# Patient Record
Sex: Male | Born: 1960 | Race: Black or African American | Hispanic: No | Marital: Married | Smoking: Current some day smoker
Health system: Southern US, Community
[De-identification: ages and names within clinical notes are randomized; demographics above are authoritative.]

## PROBLEM LIST (undated history)

## (undated) DIAGNOSIS — G4730 Sleep apnea, unspecified: Secondary | ICD-10-CM

## (undated) DIAGNOSIS — R7303 Prediabetes: Secondary | ICD-10-CM

## (undated) DIAGNOSIS — U071 COVID-19: Secondary | ICD-10-CM

## (undated) DIAGNOSIS — K219 Gastro-esophageal reflux disease without esophagitis: Secondary | ICD-10-CM

## (undated) DIAGNOSIS — I10 Essential (primary) hypertension: Secondary | ICD-10-CM

## (undated) DIAGNOSIS — M75102 Unspecified rotator cuff tear or rupture of left shoulder, not specified as traumatic: Secondary | ICD-10-CM

## (undated) DIAGNOSIS — K649 Unspecified hemorrhoids: Secondary | ICD-10-CM

## (undated) DIAGNOSIS — J449 Chronic obstructive pulmonary disease, unspecified: Secondary | ICD-10-CM

## (undated) DIAGNOSIS — R0600 Dyspnea, unspecified: Secondary | ICD-10-CM

## (undated) DIAGNOSIS — K921 Melena: Secondary | ICD-10-CM

## (undated) DIAGNOSIS — T7840XA Allergy, unspecified, initial encounter: Secondary | ICD-10-CM

## (undated) HISTORY — DX: Chronic obstructive pulmonary disease, unspecified: J44.9

## (undated) HISTORY — DX: Unspecified hemorrhoids: K64.9

## (undated) HISTORY — DX: Sleep apnea, unspecified: G47.30

## (undated) HISTORY — PX: OTHER SURGICAL HISTORY: SHX169

## (undated) HISTORY — PX: BOWEL RESECTION: SHX1257

## (undated) HISTORY — DX: Prediabetes: R73.03

## (undated) HISTORY — DX: Essential (primary) hypertension: I10

## (undated) HISTORY — PX: TONSILLECTOMY: SUR1361

## (undated) HISTORY — DX: Allergy, unspecified, initial encounter: T78.40XA

---

## 2003-03-22 DIAGNOSIS — I10 Essential (primary) hypertension: Secondary | ICD-10-CM | POA: Insufficient documentation

## 2012-01-03 DIAGNOSIS — Z0000 Encounter for general adult medical examination without abnormal findings: Secondary | ICD-10-CM | POA: Insufficient documentation

## 2012-02-18 ENCOUNTER — Encounter: Payer: Self-pay | Admitting: Internal Medicine

## 2012-03-25 ENCOUNTER — Ambulatory Visit (AMBULATORY_SURGERY_CENTER): Payer: BC Managed Care – PPO

## 2012-03-25 VITALS — Ht 70.0 in | Wt 265.0 lb

## 2012-03-25 DIAGNOSIS — Z1211 Encounter for screening for malignant neoplasm of colon: Secondary | ICD-10-CM

## 2012-03-25 MED ORDER — MOVIPREP 100 G PO SOLR: 1.0000 | Freq: Once | ORAL | Status: DC

## 2012-03-27 ENCOUNTER — Telehealth: Payer: Self-pay | Admitting: *Deleted

## 2012-03-27 NOTE — Telephone Encounter (Signed)
LM on pt. Answering machine to call if he still needs his prep called to [facility].  Our records show it was sent to the [facility] at [facility].  I received this call directly on my office phone.  I called [facility] pharmacy and they said the Rx was ready.

## 2012-04-06 ENCOUNTER — Ambulatory Visit (AMBULATORY_SURGERY_CENTER): Payer: BC Managed Care – PPO | Admitting: Internal Medicine

## 2012-04-06 ENCOUNTER — Encounter: Payer: Self-pay | Admitting: Internal Medicine

## 2012-04-06 VITALS — BP 147/96 | HR 74 | Temp 98.1°F | Resp 20 | Ht 70.0 in | Wt 265.0 lb

## 2012-04-06 DIAGNOSIS — Z1211 Encounter for screening for malignant neoplasm of colon: Secondary | ICD-10-CM

## 2012-04-06 DIAGNOSIS — D126 Benign neoplasm of colon, unspecified: Secondary | ICD-10-CM

## 2012-04-06 MED ORDER — SODIUM CHLORIDE 0.9 % IV SOLN: 500.0000 mL | INTRAVENOUS | Status: DC

## 2012-04-06 NOTE — Op Note (Signed)
[facility] [address] [place], [address]   COLONOSCOPY PROCEDURE REPORT  PATIENT: Levi Blankenship, Levi Blankenship  MR#: [id number] BIRTHDATE: 02/15/1961 , 51  yrs. old GENDER: Male ENDOSCOPIST: Beverley Fiedler, MD REFERRED BY: Abbott Pao, MD PROCEDURE DATE:  04/06/2012 PROCEDURE:   Colonoscopy with snare polypectomy ASA CLASS:   Class II INDICATIONS:average risk screening and first colonoscopy. MEDICATIONS: MAC sedation, administered by CRNA and propofol (Diprivan) 400mg  IV  DESCRIPTION OF PROCEDURE:   After the risks benefits and alternatives of the procedure were thoroughly explained, informed consent was obtained.  A digital rectal exam revealed no rectal mass.   The LB [id number]  endoscope was introduced through the anus and advanced to the cecum, which was identified by both the appendix and ileocecal valve. No adverse events experienced. The quality of the prep was good, using MoviPrep  The instrument was then slowly withdrawn as the colon was fully examined.   COLON FINDINGS: Six sessile polyps ranging between 3-31mm in size were found in the distal sigmoid colon and rectum.  Polypectomy was performed using cold snare.  All resections were complete and all polyp tissue was completely retrieved.   There was moderate diverticulosis noted in the ascending colon, descending colon, and sigmoid colon with associated muscular hypertrophy.  Retroflexed views revealed no abnormalities. The time to cecum=2 minutes 58 seconds.  Withdrawal time=17 minutes 44 seconds.  The scope was withdrawn and the procedure completed. COMPLICATIONS: There were no complications.  ENDOSCOPIC IMPRESSION: 1.   Six sessile polyps ranging between 3-72mm in size were found in the distal sigmoid colon and rectum; Polypectomy was performed using cold snare 2.   There was moderate diverticulosis noted in the ascending colon, descending colon, and sigmoid colon  RECOMMENDATIONS: 1.   Await pathology results 2.  High fiber diet 3.  Timing of repeat colonoscopy will be determined by pathology findings. 4.  You will receive a letter within 1-2 weeks with the results of your biopsy as well as final recommendations.  Please call my office if you have not received a letter after 3 weeks.   eSigned:  Beverley Fiedler, MD 04/06/2012 2:03 PM  cc: The Patient, Abbott Pao MD

## 2012-04-06 NOTE — Progress Notes (Signed)
Patient did not experience any of the following events: a burn prior to discharge; a fall within the facility; wrong site/side/patient/procedure/implant event; or a hospital transfer or hospital admission upon discharge from the facility. (G8907) Patient did not have preoperative order for IV antibiotic SSI prophylaxis. (G8918)  

## 2012-04-06 NOTE — Progress Notes (Signed)
Called to room to assist during endoscopic procedure.  Patient ID and intended procedure confirmed with present staff. Received instructions for my participation in the procedure from the performing physician.  

## 2012-04-06 NOTE — Patient Instructions (Addendum)
Handouts were given to your care partner on polyps, diverticulosis and high fiber diet.   You may resume your current medications today.  Please call if any questions or concerns.   YOU HAD AN ENDOSCOPIC PROCEDURE TODAY AT THE [facility]: Refer to the procedure report that was given to you for any specific questions about what was found during the examination.  If the procedure report does not answer your questions, please call your gastroenterologist to clarify.  If you requested that your care partner not be given the details of your procedure findings, then the procedure report has been included in a sealed envelope for you to review at your convenience later.  YOU SHOULD EXPECT: Some feelings of bloating in the abdomen. Passage of more gas than usual.  Walking can help get rid of the air that was put into your GI tract during the procedure and reduce the bloating. If you had a lower endoscopy (such as a colonoscopy or flexible sigmoidoscopy) you may notice spotting of blood in your stool or on the toilet paper. If you underwent a bowel prep for your procedure, then you may not have a normal bowel movement for a few days.  DIET: Your first meal following the procedure should be a light meal and then it is ok to progress to your normal diet.  A half-sandwich or bowl of soup is an example of a good first meal.  Heavy or fried foods are harder to digest and may make you feel nauseous or bloated.  Likewise meals heavy in dairy and vegetables can cause extra gas to form and this can also increase the bloating.  Drink plenty of fluids but you should avoid alcoholic beverages for 24 hours.  ACTIVITY: Your care partner should take you home directly after the procedure.  You should plan to take it easy, moving slowly for the rest of the day.  You can resume normal activity the day after the procedure however you should NOT DRIVE or use heavy machinery for 24 hours (because of the sedation medicines  used during the test).    SYMPTOMS TO REPORT IMMEDIATELY: A gastroenterologist can be reached at any hour.  During normal business hours, 8:30 AM to 5:00 PM Monday through Friday, call [phone].  After hours and on weekends, please call the GI answering service at [phone] who will take a message and have the physician on call contact you.   Following lower endoscopy (colonoscopy or flexible sigmoidoscopy):  Excessive amounts of blood in the stool  Significant tenderness or worsening of abdominal pains  Swelling of the abdomen that is new, acute  Fever of 100F or higher    FOLLOW UP: If any biopsies were taken you will be contacted by phone or by letter within the next 1-3 weeks.  Call your gastroenterologist if you have not heard about the biopsies in 3 weeks.  Our staff will call the home number listed on your records the next business day following your procedure to check on you and address any questions or concerns that you may have at that time regarding the information given to you following your procedure. This is a courtesy call and so if there is no answer at the home number and we have not heard from you through the emergency physician on call, we will assume that you have returned to your regular daily activities without incident.  SIGNATURES/CONFIDENTIALITY: You and/or your care partner have signed paperwork which will be entered into   your electronic medical record.  These signatures attest to the fact that that the information above on your After Visit Summary has been reviewed and is understood.  Full responsibility of the confidentiality of this discharge information lies with you and/or your care-partner.  

## 2012-04-06 NOTE — Progress Notes (Signed)
No complaints noted in the recovery room. Maw   

## 2012-04-07 ENCOUNTER — Telehealth: Payer: Self-pay | Admitting: *Deleted

## 2012-04-07 NOTE — Telephone Encounter (Signed)
  Follow up Call-  Call back number 04/06/2012  Post procedure Call Back phone  # [phone]  Permission to leave phone message Yes     Patient questions:  Do you have a fever, pain , or abdominal swelling? no Pain Score  0 *  Have you tolerated food without any problems? yes  Have you been able to return to your normal activities? yes  Do you have any questions about your discharge instructions: Diet   no Medications  no Follow up visit  no  Do you have questions or concerns about your Care? no  Actions: * If pain score is 4 or above: No action needed, pain <4.

## 2012-04-13 ENCOUNTER — Encounter: Payer: Self-pay | Admitting: Internal Medicine

## 2012-06-22 ENCOUNTER — Emergency Department (HOSPITAL_COMMUNITY)
Admission: EM | Admit: 2012-06-22 | Discharge: 2012-06-23 | Disposition: A | Discharge status: Home / Self Care | Payer: BC Managed Care – PPO | Attending: Emergency Medicine | Admitting: Emergency Medicine

## 2012-06-22 ENCOUNTER — Encounter (HOSPITAL_COMMUNITY): Payer: Self-pay

## 2012-06-22 DIAGNOSIS — Z79899 Other long term (current) drug therapy: Secondary | ICD-10-CM | POA: Insufficient documentation

## 2012-06-22 DIAGNOSIS — K089 Disorder of teeth and supporting structures, unspecified: Secondary | ICD-10-CM | POA: Insufficient documentation

## 2012-06-22 DIAGNOSIS — Z87891 Personal history of nicotine dependence: Secondary | ICD-10-CM | POA: Insufficient documentation

## 2012-06-22 DIAGNOSIS — Z7982 Long term (current) use of aspirin: Secondary | ICD-10-CM | POA: Insufficient documentation

## 2012-06-22 DIAGNOSIS — Z8669 Personal history of other diseases of the nervous system and sense organs: Secondary | ICD-10-CM | POA: Insufficient documentation

## 2012-06-22 DIAGNOSIS — K0889 Other specified disorders of teeth and supporting structures: Secondary | ICD-10-CM

## 2012-06-22 DIAGNOSIS — I10 Essential (primary) hypertension: Secondary | ICD-10-CM | POA: Insufficient documentation

## 2012-06-22 DIAGNOSIS — K047 Periapical abscess without sinus: Secondary | ICD-10-CM | POA: Insufficient documentation

## 2012-06-22 NOTE — ED Notes (Signed)
Patient presents with c/o right lower dental pain. Onset x 3 days. Patient had some PCN (unsure of dosage) at home and has taken 2 pills daily x 3 days. Reports that the swelling has decreased since taking the PCN. Patient has also taken BC powder, ibuprofen and oral gel with no relief in pain

## 2012-06-23 MED ORDER — HYDROCODONE-ACETAMINOPHEN 5-325 MG PO TABS: 1.0000 | ORAL_TABLET | ORAL | Status: DC | PRN

## 2012-06-23 MED ORDER — PENICILLIN V POTASSIUM 500 MG PO TABS: 500.0000 mg | ORAL_TABLET | Freq: Three times a day (TID) | ORAL | Status: DC

## 2012-06-23 NOTE — ED Notes (Signed)
Patient said he has been having mouth pain for about a week. The patient says it is intermittent pain that was getting better but tonight the pain got so severe he had to come in and be seen.  The patient put some BC powder on his gum to try and relieve the pain but it did not work.

## 2012-06-23 NOTE — ED Provider Notes (Signed)
History     CSN: [id number]  Arrival date & time 06/22/12  2327   First MD Initiated Contact with Patient 06/23/12 0038      Chief Complaint  Patient presents with  . Dental Pain   HPI  History provided by the patient. Patient is a 52 year old male with history of hypertension and sleep apnea who presents with complaints of right lower dental pain. Patient reports occasional off-and-on pains for past week or more. On Friday he began having increased pain she gradually worsened. Patient has been crushing BC powders and applying to the gums and tooth area initially with some relief but now without any relief of pain. Patient also reports using some leftover penicillin pills from old prescription. He uses for 2 days but has run out. Patient denies having any swelling of the gums or face. No bleeding or drainage from the mouth. No dental injury or trauma. Patient does wear partial dentures lower mouth but denies any change. No other aggravating or alleviating factors. Denies any associated symptoms. No fever, chills or sweats. Difficulty swallowing or breathing.      Past Medical History  Diagnosis Date  . Hypertension   . Sleep apnea     not currently using c-pap    Past Surgical History  Procedure Laterality Date  . Tumor neck      benign and removed  . Tonsillectomy      Family History  Problem Relation Age of Onset  . Colon cancer Maternal Grandmother     History  Substance Use Topics  . Smoking status: Former Games developer  . Smokeless tobacco: Never Used  . Alcohol Use: 0.5 oz/week    1 drink(s) per week      Review of Systems  Constitutional: Negative for fever and chills.  HENT: Positive for dental problem. Negative for sore throat and trouble swallowing.   Gastrointestinal: Negative for nausea and vomiting.  All other systems reviewed and are negative.    Allergies  Review of patient's allergies indicates no known allergies.  Home Medications   Current  Outpatient Rx  Name  Route  Sig  Dispense  Refill  . aspirin EC 81 MG tablet   Oral   Take 81 mg by mouth daily.         . cetirizine (ZYRTEC) 10 MG tablet   Oral   Take 10 mg by mouth daily.         Marland Kitchen lisinopril (PRINIVIL,ZESTRIL) 10 MG tablet               . PENICILLIN V POTASSIUM PO   Oral   Take 1 tablet by mouth.           BP 153/100  Pulse 77  Temp(Src) 98.1 F (36.7 C) (Oral)  Resp 18  SpO2 99%  Physical Exam  Nursing note and vitals reviewed. Constitutional: He is oriented to person, place, and time. He appears well-developed and well-nourished. No distress.  HENT:  Head: Normocephalic.  Mouth/Throat:    Patient with partial dentures to the lower teeth. Partial denture includes bilateral back molars and frontal incisor teeth. Patient exhibits tenderness to the lower gum at the base of the right second premolar area. There is no swelling or fluctuance or signs of drainable abscess. There is not significant pain to percussion over the tooth. No significant fractures. There is slight irritation to the gingiva at the base of the tooth. No friability.  Neck: Normal range of motion. Neck supple.  Cardiovascular:  Normal rate and regular rhythm.   Pulmonary/Chest: Effort normal and breath sounds normal.  Abdominal: Soft.  Musculoskeletal: Normal range of motion.  Lymphadenopathy:    He has no cervical adenopathy.  Neurological: He is alert and oriented to person, place, and time.  Skin: Skin is warm and dry.  Psychiatric: He has a normal mood and affect. His behavior is normal.    ED Course  Procedures  Dental Block Performed by: Angus Seller Authorized by: Angus Seller Consent: Verbal consent obtained. Risks and benefits: risks, benefits and alternatives were discussed Consent given by: patient Patient identity confirmed: provided demographic data  Location: Right lower second premolar  Local anesthetic: Bupivacaine 0.5% with  epinephrine  Anesthetic total: 1.8 ml  Irrigation method: syringe  Patient tolerance: Patient tolerated the procedure well with no immediate complications. Pain improved.      1. Pain, dental   2. Periapical abscess       MDM  1:15AM patient seen and evaluated. Patient sitting appears comfortable in no acute distress.  Patient reports complete relief of pain after dental block. Patient given prescription for penicillin to continue as well as pain medicine and dental referral.     Angus Seller, PA-C 06/23/12 2150

## 2012-06-23 NOTE — ED Notes (Signed)
Patient is alert and orientedx4.  Patient was explained discharge instructions and they understood them with no questions.   

## 2012-06-24 NOTE — ED Provider Notes (Signed)
Medical screening examination/treatment/procedure(s) were performed by non-physician practitioner and as supervising physician I was immediately available for consultation/collaboration.  Ricardo Schubach, MD 06/24/12 0624 

## 2014-07-20 DIAGNOSIS — M159 Polyosteoarthritis, unspecified: Secondary | ICD-10-CM | POA: Insufficient documentation

## 2014-07-20 DIAGNOSIS — M8949 Other hypertrophic osteoarthropathy, multiple sites: Secondary | ICD-10-CM | POA: Insufficient documentation

## 2015-01-05 ENCOUNTER — Encounter (HOSPITAL_COMMUNITY): Payer: Self-pay | Admitting: *Deleted

## 2015-01-05 ENCOUNTER — Emergency Department (INDEPENDENT_AMBULATORY_CARE_PROVIDER_SITE_OTHER): Payer: BC Managed Care – PPO

## 2015-01-05 ENCOUNTER — Emergency Department (INDEPENDENT_AMBULATORY_CARE_PROVIDER_SITE_OTHER)
Admission: EM | Admit: 2015-01-05 | Discharge: 2015-01-05 | Disposition: A | Discharge status: Home / Self Care | Payer: BC Managed Care – PPO | Source: Home / Self Care | Attending: Family Medicine | Admitting: Family Medicine

## 2015-01-05 DIAGNOSIS — M5000 Cervical disc disorder with myelopathy, unspecified cervical region: Secondary | ICD-10-CM

## 2015-01-05 MED ORDER — CYCLOBENZAPRINE HCL 5 MG PO TABS: 5.0000 mg | ORAL_TABLET | Freq: Three times a day (TID) | ORAL | Status: DC | PRN

## 2015-01-05 MED ORDER — METHYLPREDNISOLONE 4 MG PO TBPK: ORAL_TABLET | ORAL | Status: DC

## 2015-01-05 NOTE — ED Provider Notes (Addendum)
CSN: [id number]     Arrival date & time 01/05/15  1300 History   First MD Initiated Contact with Patient 01/05/15 1332     Chief Complaint  Patient presents with  . Neck Pain   (Consider location/radiation/quality/duration/timing/severity/associated sxs/prior Treatment) Patient is a 54 y.o. male presenting with neck pain. The history is provided by the patient.  Neck Pain Pain location:  R side Quality:  Shooting Radiates to: c/o pain into right thigh. Pain severity:  Moderate Onset quality:  Gradual Duration:  1 month Progression:  Worsening Chronicity:  New Context: not fall and not recent injury   Ineffective treatments:  None tried Associated symptoms: no bladder incontinence, no bowel incontinence, no numbness and no weakness   Risk factors: no hx of spinal trauma and no recent head injury     Past Medical History  Diagnosis Date  . Hypertension   . Sleep apnea     not currently using c-pap   Past Surgical History  Procedure Laterality Date  . Tumor neck      benign and removed  . Tonsillectomy     Family History  Problem Relation Age of Onset  . Colon cancer Maternal Grandmother    Social History  Substance Use Topics  . Smoking status: Former Research scientist (life sciences)  . Smokeless tobacco: Never Used  . Alcohol Use: 0.5 oz/week    1 drink(s) per week    Review of Systems  Gastrointestinal: Negative for bowel incontinence.  Genitourinary: Negative for bladder incontinence.  Musculoskeletal: Positive for neck pain. Negative for back pain, joint swelling and gait problem.  Skin: Negative.   Neurological: Negative for weakness and numbness.  All other systems reviewed and are negative.   Allergies  Review of patient's allergies indicates no known allergies.  Home Medications   Prior to Admission medications   Medication Sig Start Date End Date Taking? Authorizing Provider  aspirin EC 81 MG tablet Take 81 mg by mouth daily.    Historical Provider, MD  cetirizine  (ZYRTEC) 10 MG tablet Take 10 mg by mouth daily.    Historical Provider, MD  cyclobenzaprine (FLEXERIL) 5 MG tablet Take 1 tablet (5 mg total) by mouth 3 (three) times daily as needed for muscle spasms. 01/05/15   Billy Fischer, MD  HYDROcodone-acetaminophen (NORCO) 5-325 MG per tablet Take 1 tablet by mouth every 4 (four) hours as needed for pain. 06/23/12   Hazel Sams, PA-C  lisinopril (PRINIVIL,ZESTRIL) 10 MG tablet  03/03/12   Historical Provider, MD  methylPREDNISolone (MEDROL DOSEPAK) 4 MG TBPK tablet follow package directions, start on Friday. 01/05/15   Billy Fischer, MD  penicillin v potassium (VEETID) 500 MG tablet Take 1 tablet (500 mg total) by mouth 3 (three) times daily. 06/23/12   Hazel Sams, PA-C  PENICILLIN V POTASSIUM PO Take 1 tablet by mouth.    Historical Provider, MD   Meds Ordered and Administered this Visit  Medications - No data to display  BP 151/83 mmHg  Pulse 84  Temp(Src) 98.8 F (37.1 C) (Oral)  Resp 18  SpO2 98% No data found.   Physical Exam  Constitutional: He is oriented to person, place, and time. He appears well-developed and well-nourished.  Musculoskeletal: He exhibits tenderness.       Cervical back: He exhibits tenderness and pain. He exhibits normal range of motion, no bony tenderness, no swelling and normal pulse.       Back:  Neurological: He is alert and oriented to person, place, and  time.  Skin: Skin is warm and dry.  Nursing note and vitals reviewed.   ED Course  Procedures (including critical care time)  Labs Review Labs Reviewed - No data to display  Imaging Review Dg Cervical Spine Complete  01/05/2015  CLINICAL DATA:  Neck pain for a month, on the right. EXAM: CERVICAL SPINE  4+ VIEWS COMPARISON:  None. FINDINGS: Moderate degenerative disc narrowing and endplate spurring from J2-8 to C6-7. Left predominant mid cervical facet arthropathy with overgrowth narrowing the left C3-4 and C4-5 foramina, likely overestimated by  obliquity. No acute findings such is fracture, endplate erosion, or prevertebral edema. No subluxation. IMPRESSION: Lower cervical degenerative disc disease and milder facet arthropathy. Left C3-4 and C4-5 foraminal stenosis. Electronically Signed   By: Monte Fantasia M.D.   On: 01/05/2015 13:57     Visual Acuity Review  Right Eye Distance:   Left Eye Distance:   Bilateral Distance:    Right Eye Near:   Left Eye Near:    Bilateral Near:         MDM   1. Cervical disc disease with myelopathy        Billy Fischer, MD 01/06/15 [place], MD 01/06/15 1430

## 2015-01-05 NOTE — Discharge Instructions (Signed)
Take medicine as prescribed and see orthopedist next week for recheck.

## 2015-01-05 NOTE — ED Notes (Signed)
Pt  Reports     Neck  Pain   X  1  Month  Off  And  On     denys  Any  specefic  Injury           He    Is   Sitting  Upright on the  Exam table         Speaking in  Complete  sentances         Pt reports   Pain radiates  Down  r     Leg

## 2015-01-25 ENCOUNTER — Emergency Department (INDEPENDENT_AMBULATORY_CARE_PROVIDER_SITE_OTHER)
Admission: EM | Admit: 2015-01-25 | Discharge: 2015-01-25 | Disposition: A | Discharge status: Home / Self Care | Payer: BC Managed Care – PPO | Source: Home / Self Care | Attending: Family Medicine | Admitting: Family Medicine

## 2015-01-25 ENCOUNTER — Encounter (HOSPITAL_COMMUNITY): Payer: Self-pay | Admitting: Emergency Medicine

## 2015-01-25 DIAGNOSIS — A499 Bacterial infection, unspecified: Secondary | ICD-10-CM | POA: Diagnosis not present

## 2015-01-25 DIAGNOSIS — B9689 Other specified bacterial agents as the cause of diseases classified elsewhere: Secondary | ICD-10-CM

## 2015-01-25 DIAGNOSIS — J329 Chronic sinusitis, unspecified: Secondary | ICD-10-CM | POA: Diagnosis not present

## 2015-01-25 MED ORDER — AMOXICILLIN-POT CLAVULANATE 875-125 MG PO TABS: 1.0000 | ORAL_TABLET | Freq: Two times a day (BID) | ORAL | Status: DC

## 2015-01-25 NOTE — Discharge Instructions (Signed)
It was a pleasure to see you today.  You are being treated for bacterial sinusitis.   I am prescribing Augmentin 875-125 tablets, 1 tablet by mouth twice daily for 7 days.   Be sure to take plenty of fluids. I recommend MUCINEX 600mg  tablets, take 2 tablets by mouth every 12 hours, to thin mucus.  Follow up with your primary doctor in [place] or [facility] if worsening, if fevers, or other concerns.

## 2015-01-25 NOTE — ED Notes (Signed)
Pt has been suffering from chest and nasal congestion, cough and intermittent fever for about two weeks.  Pt states he has some generalized aches from coughing including a headache and some abdominal/chest discomfort.

## 2015-01-25 NOTE — ED Provider Notes (Signed)
CSN: [id number]     Arrival date & time 01/25/15  1301 History   First MD Initiated Contact with Patient 01/25/15 1324     Chief Complaint  Patient presents with  . URI   (Consider location/radiation/quality/duration/timing/severity/associated sxs/prior Treatment) Patient is a 54 y.o. male presenting with URI. The history is provided by the patient. No language interpreter was used.  URI Presenting symptoms: congestion, cough and rhinorrhea   Presenting symptoms: no ear pain, no facial pain, no fatigue, no fever and no sore throat   Severity:  Severe Patient presents with 2 weeks of nasal congestion that began with some fevers off and on.  Now without fever, but does have cough productive of non-bloody sputum. Has had purulent nasal discharge and tinged with blood, No facial pain, no shortness of breath. Has fetid taste in back of throat. No ear or eye symptoms.   Smokes 3 cigs/day. Works in school environment.    Primary doctor. Dr Nash Mantis, [facility].    Past Medical History  Diagnosis Date  . Hypertension   . Sleep apnea     not currently using c-pap   Past Surgical History  Procedure Laterality Date  . Tumor neck      benign and removed  . Tonsillectomy     Family History  Problem Relation Age of Onset  . Colon cancer Maternal Grandmother    Social History  Substance Use Topics  . Smoking status: Former Research scientist (life sciences)  . Smokeless tobacco: Never Used  . Alcohol Use: 0.5 oz/week    1 drink(s) per week    Review of Systems  Constitutional: Negative for fever and fatigue.  HENT: Positive for congestion and rhinorrhea. Negative for ear pain and sore throat.   Respiratory: Positive for cough.     Allergies  Review of patient's allergies indicates no known allergies.  Home Medications   Prior to Admission medications   Medication Sig Start Date End Date Taking? Authorizing Provider  aspirin EC 81 MG tablet Take 81 mg by mouth daily.   Yes Historical  Provider, MD  lisinopril (PRINIVIL,ZESTRIL) 10 MG tablet  03/03/12  Yes Historical Provider, MD  amoxicillin-clavulanate (AUGMENTIN) 875-125 MG tablet Take 1 tablet by mouth 2 (two) times daily. 01/25/15   Willeen Niece, MD  cetirizine (ZYRTEC) 10 MG tablet Take 10 mg by mouth daily.    Historical Provider, MD  cyclobenzaprine (FLEXERIL) 5 MG tablet Take 1 tablet (5 mg total) by mouth 3 (three) times daily as needed for muscle spasms. 01/05/15   Billy Fischer, MD  HYDROcodone-acetaminophen (NORCO) 5-325 MG per tablet Take 1 tablet by mouth every 4 (four) hours as needed for pain. 06/23/12   Hazel Sams, PA-C  methylPREDNISolone (MEDROL DOSEPAK) 4 MG TBPK tablet follow package directions, start on Friday. 01/05/15   Billy Fischer, MD  penicillin v potassium (VEETID) 500 MG tablet Take 1 tablet (500 mg total) by mouth 3 (three) times daily. 06/23/12   Hazel Sams, PA-C  PENICILLIN V POTASSIUM PO Take 1 tablet by mouth.    Historical Provider, MD   Meds Ordered and Administered this Visit  Medications - No data to display  BP 132/74 mmHg  Pulse 81  Temp(Src) 98.4 F (36.9 C) (Oral)  Resp 16  SpO2 97% No data found.   Physical Exam  Constitutional: He appears well-developed and well-nourished. No distress.  HENT:  Head: Normocephalic and atraumatic.  Right Ear: External ear normal.  Left Ear: External ear normal.  Eyes: Right eye exhibits no discharge. Left eye exhibits no discharge. No scleral icterus.  Neck: Normal range of motion. Neck supple.  Cardiovascular: Normal rate, regular rhythm and normal heart sounds.   Pulmonary/Chest: Effort normal and breath sounds normal. No respiratory distress. He has no wheezes. He has no rales. He exhibits no tenderness.  Lymphadenopathy:    He has no cervical adenopathy.  Skin: He is not diaphoretic.  No facial tenderness over frontal or maxillary sinuses.  Clear oropharynx. TMs clear bilaterally.   ED Course  Procedures (including critical care  time)  Labs Review Labs Reviewed - No data to display  Imaging Review No results found.   Visual Acuity Review  Right Eye Distance:   Left Eye Distance:   Bilateral Distance:    Right Eye Near:   Left Eye Near:    Bilateral Near:         MDM   1. Sinusitis, bacterial    Patient with 2 weeks sx consistent with bacterial sinusitis. Treatment with Augmentin (he admits at end of visit he had been taking PCN tablets from his wife, one tab a day, for a few days and felt a little better, but the PCN ran out).  To treat with Augmentin, follow up as needed.     Willeen Niece, MD 01/25/15 [phone]

## 2015-05-06 ENCOUNTER — Emergency Department (INDEPENDENT_AMBULATORY_CARE_PROVIDER_SITE_OTHER)
Admission: EM | Admit: 2015-05-06 | Discharge: 2015-05-06 | Disposition: A | Discharge status: Home / Self Care | Payer: BC Managed Care – PPO | Source: Home / Self Care | Attending: Emergency Medicine | Admitting: Emergency Medicine

## 2015-05-06 ENCOUNTER — Encounter (HOSPITAL_COMMUNITY): Payer: Self-pay | Admitting: Emergency Medicine

## 2015-05-06 ENCOUNTER — Emergency Department (INDEPENDENT_AMBULATORY_CARE_PROVIDER_SITE_OTHER): Payer: BC Managed Care – PPO

## 2015-05-06 DIAGNOSIS — J40 Bronchitis, not specified as acute or chronic: Secondary | ICD-10-CM

## 2015-05-06 MED ORDER — PREDNISONE 50 MG PO TABS: ORAL_TABLET | ORAL | Status: DC

## 2015-05-06 MED ORDER — HYDROCODONE-HOMATROPINE 5-1.5 MG/5ML PO SYRP: 5.0000 mL | ORAL_SOLUTION | Freq: Four times a day (QID) | ORAL | Status: DC | PRN

## 2015-05-06 MED ORDER — AZITHROMYCIN 250 MG PO TABS: ORAL_TABLET | ORAL | Status: DC

## 2015-05-06 NOTE — ED Provider Notes (Signed)
CSN: [id number]     Arrival date & time 05/06/15  1453 History   First MD Initiated Contact with Patient 05/06/15 1638     Chief Complaint  Patient presents with  . Cough  . Generalized Body Aches  . Nasal Congestion   (Consider location/radiation/quality/duration/timing/severity/associated sxs/prior Treatment) HPI He is a 55 year old man here for evaluation of fever and body aches. He states his symptoms started a week ago with fevers, headache, generalized body aches, nasal congestion, and cough. He states at one point he did seem like he was getting a little bit better, but then got worse again. He reports some intermittent nausea, but no vomiting. He states his appetite is good. He has taken some ibuprofen with temporary improvement. Today, he has noticed some popping in his ears. Denies any sore throat.  Past Medical History  Diagnosis Date  . Hypertension   . Sleep apnea     not currently using c-pap   Past Surgical History  Procedure Laterality Date  . Tumor neck      benign and removed  . Tonsillectomy     Family History  Problem Relation Age of Onset  . Colon cancer Maternal Grandmother    Social History  Substance Use Topics  . Smoking status: Former Research scientist (life sciences)  . Smokeless tobacco: Never Used  . Alcohol Use: 0.5 oz/week    1 drink(s) per week    Review of Systems As in history of present illness Allergies  Review of patient's allergies indicates no known allergies.  Home Medications   Prior to Admission medications   Medication Sig Start Date End Date Taking? Authorizing Provider  aspirin EC 81 MG tablet Take 81 mg by mouth daily.   Yes Historical Provider, MD  felodipine (PLENDIL) 10 MG 24 hr tablet Take 10 mg by mouth daily.   Yes Historical Provider, MD  lisinopril (PRINIVIL,ZESTRIL) 10 MG tablet  03/03/12  Yes Historical Provider, MD  amoxicillin-clavulanate (AUGMENTIN) 875-125 MG tablet Take 1 tablet by mouth 2 (two) times daily. 01/25/15   Willeen Niece,  MD  azithromycin (ZITHROMAX Z-PAK) 250 MG tablet Take 2 pills today, then 1 pill daily until gone. 05/06/15   Melony Overly, MD  cetirizine (ZYRTEC) 10 MG tablet Take 10 mg by mouth daily.    Historical Provider, MD  cyclobenzaprine (FLEXERIL) 5 MG tablet Take 1 tablet (5 mg total) by mouth 3 (three) times daily as needed for muscle spasms. 01/05/15   Billy Fischer, MD  HYDROcodone-acetaminophen (NORCO) 5-325 MG per tablet Take 1 tablet by mouth every 4 (four) hours as needed for pain. 06/23/12   Hazel Sams, PA-C  HYDROcodone-homatropine (HYCODAN) 5-1.5 MG/5ML syrup Take 5 mLs by mouth every 6 (six) hours as needed for cough. 05/06/15   Melony Overly, MD  methylPREDNISolone (MEDROL DOSEPAK) 4 MG TBPK tablet follow package directions, start on Friday. 01/05/15   Billy Fischer, MD  penicillin v potassium (VEETID) 500 MG tablet Take 1 tablet (500 mg total) by mouth 3 (three) times daily. 06/23/12   Hazel Sams, PA-C  PENICILLIN V POTASSIUM PO Take 1 tablet by mouth.    Historical Provider, MD  predniSONE (DELTASONE) 50 MG tablet Take 1 pill daily for 5 days. 05/06/15   Melony Overly, MD   Meds Ordered and Administered this Visit  Medications - No data to display  BP 157/93 mmHg  Pulse 118  Temp(Src) 100.8 F (38.2 C) (Oral)  SpO2 97% No data found.   Physical  Exam  Constitutional: He is oriented to person, place, and time. He appears well-developed and well-nourished.  HENT:  Mouth/Throat: No oropharyngeal exudate.  Slight oropharyngeal erythema. Nasal mucosa is erythematous. TMs normal bilaterally.  Neck: Neck supple.  Cardiovascular: Regular rhythm and normal heart sounds.   No murmur heard. Mild tachycardia  Pulmonary/Chest: Effort normal and breath sounds normal. No respiratory distress. He has no wheezes. He has no rales.  Lymphadenopathy:    He has no cervical adenopathy.  Neurological: He is alert and oriented to person, place, and time.    ED Course  Procedures (including critical  care time)  Labs Review Labs Reviewed - No data to display  Imaging Review Dg Chest 2 View  05/06/2015  CLINICAL DATA:  Cough and fever for 1 week. EXAM: CHEST  2 VIEW COMPARISON:  None. FINDINGS: The cardiomediastinal contours are normal. An 11 mm nodular opacity projects over the periphery of the right lower lung, and anterior lateral right seventh rib. Pulmonary vasculature is normal. No consolidation, pleural effusion, or pneumothorax. No acute osseous abnormalities are seen. There is degenerative change in the spine. IMPRESSION: 1.  No acute pulmonary process. 2. Questionable 11 mm nodular opacity projecting over the right lower lung zone, as well as anterior anterior lateral seventh rib. This is more lateral than expected for nipple shadow. Bone island versus pulmonary nodule. Follow-up chest radiographs (in 2-4 weeks) to confirm radiographic stability versus nonemergent chest CT characterization recommended. No prior exams are available for comparison. Electronically Signed   By: Jeb Levering M.D.   On: 05/06/2015 17:13     MDM   1. Bronchitis    Given persistent fever after one week, will treat for bronchitis with azithromycin and prednisone. Hycodan as needed for cough.  I discussed the x-ray findings with the patient and his wife. He does have a smoking history. He has a physical scheduled at [facility] next week. I provided information about the nodule on his discharge paperwork. I have requested that he discuss this with his primary care doctor to arrange follow-up.    Melony Overly, MD 05/06/15 1725

## 2015-05-06 NOTE — ED Notes (Signed)
The patient presented to the [facility] with a complaint of a cough with general body aches and chest congestion for 1 week.

## 2015-05-06 NOTE — Discharge Instructions (Signed)
You have bronchitis. Take azithromycin and prednisone as prescribed. Use hycodan as needed for cough. You should see improvement in the next 3-5 days. If you develop fevers, difficulty breathing, or are just not getting better, please come back or go to the emergency room.  Your x-ray did show a nodule in your right lower lung. Please let your primary care doctor know about this.  The radiologist would like either a repeat chest x-ray in 2-4 weeks or a low-dose CT scan to follow-up the nodule.  Follow-up as needed.

## 2015-05-11 DIAGNOSIS — Z87891 Personal history of nicotine dependence: Secondary | ICD-10-CM | POA: Insufficient documentation

## 2015-05-23 ENCOUNTER — Other Ambulatory Visit (HOSPITAL_COMMUNITY): Payer: Self-pay | Admitting: Family Medicine

## 2015-05-23 DIAGNOSIS — R9389 Abnormal findings on diagnostic imaging of other specified body structures: Secondary | ICD-10-CM

## 2015-05-23 DIAGNOSIS — Z87891 Personal history of nicotine dependence: Secondary | ICD-10-CM

## 2015-06-05 ENCOUNTER — Ambulatory Visit (HOSPITAL_COMMUNITY)
Admission: RE | Admit: 2015-06-05 | Discharge: 2015-06-05 | Disposition: A | Discharge status: Home / Self Care | Payer: BC Managed Care – PPO | Source: Ambulatory Visit | Attending: Family Medicine | Admitting: Family Medicine

## 2015-06-05 ENCOUNTER — Encounter (HOSPITAL_COMMUNITY): Payer: Self-pay

## 2015-06-05 DIAGNOSIS — R938 Abnormal findings on diagnostic imaging of other specified body structures: Secondary | ICD-10-CM | POA: Diagnosis not present

## 2015-06-05 DIAGNOSIS — Z87891 Personal history of nicotine dependence: Secondary | ICD-10-CM | POA: Diagnosis not present

## 2015-06-05 DIAGNOSIS — R9389 Abnormal findings on diagnostic imaging of other specified body structures: Secondary | ICD-10-CM

## 2015-06-05 MED ORDER — IOHEXOL 300 MG/ML  SOLN
75.0000 mL | Freq: Once | INTRAMUSCULAR | Status: AC | PRN
  Administered 2015-06-05: 75 mL via INTRAVENOUS

## 2016-08-18 ENCOUNTER — Encounter (HOSPITAL_COMMUNITY): Payer: Self-pay | Admitting: Emergency Medicine

## 2016-08-18 ENCOUNTER — Ambulatory Visit (HOSPITAL_COMMUNITY)
Admission: EM | Admit: 2016-08-18 | Discharge: 2016-08-18 | Disposition: A | Discharge status: Home / Self Care | Payer: BC Managed Care – PPO | Attending: Internal Medicine | Admitting: Internal Medicine

## 2016-08-18 DIAGNOSIS — R059 Cough, unspecified: Secondary | ICD-10-CM

## 2016-08-18 DIAGNOSIS — R05 Cough: Secondary | ICD-10-CM

## 2016-08-18 DIAGNOSIS — J40 Bronchitis, not specified as acute or chronic: Secondary | ICD-10-CM

## 2016-08-18 MED ORDER — AZITHROMYCIN 250 MG PO TABS: 250.0000 mg | ORAL_TABLET | Freq: Every day | ORAL | 0 refills | Status: DC

## 2016-08-18 MED ORDER — IPRATROPIUM BROMIDE 0.06 % NA SOLN: 2.0000 | Freq: Four times a day (QID) | NASAL | 0 refills | Status: DC

## 2016-08-18 MED ORDER — BENZONATATE 100 MG PO CAPS: 200.0000 mg | ORAL_CAPSULE | Freq: Three times a day (TID) | ORAL | 0 refills | Status: DC | PRN

## 2016-08-18 MED ORDER — METHYLPREDNISOLONE 4 MG PO TBPK: ORAL_TABLET | ORAL | 0 refills | Status: DC

## 2016-08-18 NOTE — ED Provider Notes (Signed)
CSN: [id number]     Arrival date & time 08/18/16  1209 History   None    Chief Complaint  Patient presents with  . URI   (Consider location/radiation/quality/duration/timing/severity/associated sxs/prior Treatment) Patient is coughing and tight in chest and having difficulty sleeping at night.   The history is provided by the patient.  URI  Presenting symptoms: congestion, cough, fatigue and rhinorrhea   Severity:  Moderate Onset quality:  Sudden Duration:  2 days Timing:  Constant Progression:  Worsening Chronicity:  New Relieved by:  Nothing Worsened by:  Nothing   Past Medical History:  Diagnosis Date  . Hypertension   . Sleep apnea    not currently using c-pap   Past Surgical History:  Procedure Laterality Date  . TONSILLECTOMY    . tumor neck     benign and removed   Family History  Problem Relation Age of Onset  . Colon cancer Maternal Grandmother    Social History  Substance Use Topics  . Smoking status: Former Research scientist (life sciences)  . Smokeless tobacco: Never Used  . Alcohol use 0.5 oz/week    1 drink(s) per week    Review of Systems  Constitutional: Positive for fatigue.  HENT: Positive for congestion and rhinorrhea.   Eyes: Negative.   Respiratory: Positive for cough.   Cardiovascular: Negative.   Gastrointestinal: Negative.   Endocrine: Negative.   Genitourinary: Negative.   Musculoskeletal: Negative.   Allergic/Immunologic: Negative.   Neurological: Negative.   Hematological: Negative.   Psychiatric/Behavioral: Negative.     Allergies  Patient has no known allergies.  Home Medications   Prior to Admission medications   Medication Sig Start Date End Date Taking? Authorizing Provider  amoxicillin-clavulanate (AUGMENTIN) 875-125 MG tablet Take 1 tablet by mouth 2 (two) times daily. 01/25/15   Willeen Niece, MD  aspirin EC 81 MG tablet Take 81 mg by mouth daily.    [provider]  azithromycin (ZITHROMAX Z-PAK) 250 MG tablet Take 2 pills  today, then 1 pill daily until gone. 05/06/15   Melony Overly, MD  azithromycin (ZITHROMAX) 250 MG tablet Take 1 tablet (250 mg total) by mouth daily. Take first 2 tablets together, then 1 every day until finished. 08/18/16   Lysbeth Penner, FNP  benzonatate (TESSALON) 100 MG capsule Take 2 capsules (200 mg total) by mouth 3 (three) times daily as needed for cough. 08/18/16   Lysbeth Penner, FNP  cetirizine (ZYRTEC) 10 MG tablet Take 10 mg by mouth daily.    [provider]  cyclobenzaprine (FLEXERIL) 5 MG tablet Take 1 tablet (5 mg total) by mouth 3 (three) times daily as needed for muscle spasms. 01/05/15   Billy Fischer, MD  felodipine (PLENDIL) 10 MG 24 hr tablet Take 10 mg by mouth daily.    [provider]  HYDROcodone-acetaminophen (NORCO) 5-325 MG per tablet Take 1 tablet by mouth every 4 (four) hours as needed for pain. 06/23/12   Dammen, Collier [place], PA-C  HYDROcodone-homatropine (HYCODAN) 5-1.5 MG/5ML syrup Take 5 mLs by mouth every 6 (six) hours as needed for cough. 05/06/15   Melony Overly, MD  ipratropium (ATROVENT) 0.06 % nasal spray Place 2 sprays into both nostrils 4 (four) times daily. 08/18/16   Lysbeth Penner, FNP  lisinopril (PRINIVIL,ZESTRIL) 10 MG tablet  03/03/12   [provider]  methylPREDNISolone (MEDROL DOSEPAK) 4 MG TBPK tablet follow package directions, start on Friday. 01/05/15   Billy Fischer, MD  methylPREDNISolone (MEDROL DOSEPAK) 4  MG TBPK tablet Take 6-5-4-3-2-1 po qd 08/18/16   Lysbeth Penner, FNP  penicillin v potassium (VEETID) 500 MG tablet Take 1 tablet (500 mg total) by mouth 3 (three) times daily. 06/23/12   Hazel Sams, PA-C  PENICILLIN V POTASSIUM PO Take 1 tablet by mouth.    [provider]  predniSONE (DELTASONE) 50 MG tablet Take 1 pill daily for 5 days. 05/06/15   Melony Overly, MD   Meds Ordered and Administered this Visit  Medications - No data to display  BP 133/86 (BP Location: Right Arm) Comment (BP Location):  large cuff  Pulse 100   Temp 98.3 F (36.8 C) (Oral)   Resp (!) 24   SpO2 99%  No data found.   Physical Exam  Constitutional: He is oriented to person, place, and time. He appears well-developed and well-nourished.  HENT:  Head: Normocephalic and atraumatic.  Right Ear: External ear normal.  Left Ear: External ear normal.  Mouth/Throat: Oropharynx is clear and moist.  Eyes: Conjunctivae and EOM are normal. Pupils are equal, round, and reactive to light.  Neck: Normal range of motion. Neck supple.  Cardiovascular: Normal rate, regular rhythm and normal heart sounds.   Pulmonary/Chest: Effort normal and breath sounds normal.  Abdominal: Soft. Bowel sounds are normal.  Neurological: He is alert and oriented to person, place, and time.  Nursing note and vitals reviewed.   Urgent Care Course     Procedures (including critical care time)  Labs Review Labs Reviewed - No data to display  Imaging Review No results found.   Visual Acuity Review  Right Eye Distance:   Left Eye Distance:   Bilateral Distance:    Right Eye Near:   Left Eye Near:    Bilateral Near:         MDM   1. Bronchitis   2. Cough    Zpak Medrol dose pack Tessalon perles Atrovent nasal spray  Push po fluids, rest, tylenol and motrin otc prn as directed for fever, arthralgias, and myalgias.  Follow up prn if sx's continue or persist.    Lysbeth Penner, FNP 08/18/16 [place], [place], FNP 08/18/16 [id number]

## 2016-08-18 NOTE — ED Triage Notes (Signed)
Cold symptoms for 2 weeks.  Symptoms get better than worse.  [facility]-patient was prescribed a nasal spray and an allergy pill

## 2016-08-18 NOTE — ED Triage Notes (Signed)
Patient has coughing and tight in chest when trying to sleep at night

## 2017-02-24 ENCOUNTER — Emergency Department (HOSPITAL_COMMUNITY)
Admission: EM | Admit: 2017-02-24 | Discharge: 2017-02-24 | Disposition: A | Discharge status: Home / Self Care | Payer: BC Managed Care – PPO | Attending: Emergency Medicine | Admitting: Emergency Medicine

## 2017-02-24 ENCOUNTER — Encounter (HOSPITAL_COMMUNITY): Payer: Self-pay

## 2017-02-24 ENCOUNTER — Emergency Department (HOSPITAL_COMMUNITY): Payer: BC Managed Care – PPO

## 2017-02-24 DIAGNOSIS — Z79899 Other long term (current) drug therapy: Secondary | ICD-10-CM | POA: Insufficient documentation

## 2017-02-24 DIAGNOSIS — I10 Essential (primary) hypertension: Secondary | ICD-10-CM | POA: Insufficient documentation

## 2017-02-24 DIAGNOSIS — J40 Bronchitis, not specified as acute or chronic: Secondary | ICD-10-CM | POA: Diagnosis not present

## 2017-02-24 DIAGNOSIS — R05 Cough: Secondary | ICD-10-CM | POA: Diagnosis present

## 2017-02-24 DIAGNOSIS — F17200 Nicotine dependence, unspecified, uncomplicated: Secondary | ICD-10-CM | POA: Diagnosis not present

## 2017-02-24 MED ORDER — FLUTICASONE PROPIONATE 50 MCG/ACT NA SUSP: 1.0000 | Freq: Every day | NASAL | 2 refills | Status: DC

## 2017-02-24 MED ORDER — ALBUTEROL SULFATE HFA 108 (90 BASE) MCG/ACT IN AERS: 1.0000 | INHALATION_SPRAY | Freq: Four times a day (QID) | RESPIRATORY_TRACT | 0 refills | Status: DC | PRN

## 2017-02-24 MED ORDER — IPRATROPIUM-ALBUTEROL 0.5-2.5 (3) MG/3ML IN SOLN
3.0000 mL | Freq: Once | RESPIRATORY_TRACT | Status: AC
  Administered 2017-02-24: 3 mL via RESPIRATORY_TRACT
  Filled 2017-02-24: qty 3

## 2017-02-24 NOTE — ED Triage Notes (Signed)
Per Pt, Pt reports that he is having cough and congestion for the past week. Reports that [facility] and was given medication, but it has not helped. Reports productive cough with yellow sputum.

## 2017-02-24 NOTE — ED Notes (Signed)
Pt returned from xray

## 2017-02-24 NOTE — Discharge Instructions (Signed)
Take prednisone as directed.  Use Flonase as directed.  Use albuterol inhaler as directed.  As we discussed, you cannot use regular over-the-counter cough syrup.  You can either use honey or Coricidin cough medication.  Follow-up with your primary care doctor next 4-5 days for further evaluation.  Return to the emergency department for any fever, chest pain, difficulty breathing, nausea/vomiting, wheezing or any other worsening or concerning symptoms.

## 2017-02-24 NOTE — ED Provider Notes (Signed)
[facility] EMERGENCY DEPARTMENT Provider Note   CSN: [id number] Arrival date & time: 02/24/17  4098     History   Chief Complaint Chief Complaint  Patient presents with  . Cough    HPI Levi Blankenship is a 56 y.o. male past medical history of hypertension who presents today for evaluation of 1.5 weeks of cough, nasal congestion and rhinorrhea.  Patient reports that [facility] valuation of symptoms.  At that time, he was diagnosed with bronchitis and prescribed Tessalon Perles and a prednisone taper which he states that he has been taking.  Patient comes to the emergency department today because cough has persisted.  He reports difficulty sleeping secondary to coughing fit.  Patient reports some shortness of breath when he gets into a coughing fit.  He states that cough is productive with yellow and green sputum.  Patient reports subjective fever but has not measured a temperature.  Patient states that he was diagnosed with mild asthma several years ago and has not half pack of cigarettes a day but has not smoked in the last 3 days secondary to symptoms.  The history is provided by the patient.    Past Medical History:  Diagnosis Date  . Hypertension   . Sleep apnea    not currently using c-pap    There are no active problems to display for this patient.   Past Surgical History:  Procedure Laterality Date  . TONSILLECTOMY    . tumor neck     benign and removed       Home Medications    Prior to Admission medications   Medication Sig Start Date End Date Taking? Authorizing Provider  albuterol (PROVENTIL HFA;VENTOLIN HFA) 108 (90 Base) MCG/ACT inhaler Inhale 1-2 puffs into the lungs every 6 (six) hours as needed for wheezing or shortness of breath. 02/24/17   Volanda Napoleon, PA-C  amoxicillin-clavulanate (AUGMENTIN) 875-125 MG tablet Take 1 tablet by mouth 2 (two) times daily. 01/25/15   Willeen Niece, MD  aspirin  EC 81 MG tablet Take 81 mg by mouth daily.    [provider]  azithromycin (ZITHROMAX Z-PAK) 250 MG tablet Take 2 pills today, then 1 pill daily until gone. 05/06/15   Melony Overly, MD  azithromycin (ZITHROMAX) 250 MG tablet Take 1 tablet (250 mg total) by mouth daily. Take first 2 tablets together, then 1 every day until finished. 08/18/16   Lysbeth Penner, FNP  benzonatate (TESSALON) 100 MG capsule Take 2 capsules (200 mg total) by mouth 3 (three) times daily as needed for cough. 08/18/16   Lysbeth Penner, FNP  cetirizine (ZYRTEC) 10 MG tablet Take 10 mg by mouth daily.    [provider]  cyclobenzaprine (FLEXERIL) 5 MG tablet Take 1 tablet (5 mg total) by mouth 3 (three) times daily as needed for muscle spasms. 01/05/15   Billy Fischer, MD  felodipine (PLENDIL) 10 MG 24 hr tablet Take 10 mg by mouth daily.    [provider]  fluticasone (FLONASE) 50 MCG/ACT nasal spray Place 1 spray into both nostrils daily. 02/24/17   Volanda Napoleon, PA-C  HYDROcodone-acetaminophen (NORCO) 5-325 MG per tablet Take 1 tablet by mouth every 4 (four) hours as needed for pain. 06/23/12   Dammen, Collier [place], PA-C  HYDROcodone-homatropine (HYCODAN) 5-1.5 MG/5ML syrup Take 5 mLs by mouth every 6 (six) hours as needed for cough. 05/06/15   Melony Overly, MD  ipratropium (ATROVENT) 0.06 % nasal spray Place 2 sprays into both nostrils 4 (four) times daily. 08/18/16   Lysbeth Penner, FNP  lisinopril (PRINIVIL,ZESTRIL) 10 MG tablet  03/03/12   [provider]  methylPREDNISolone (MEDROL DOSEPAK) 4 MG TBPK tablet follow package directions, start on Friday. 01/05/15   Billy Fischer, MD  methylPREDNISolone (MEDROL DOSEPAK) 4 MG TBPK tablet Take 6-5-4-3-2-1 po qd 08/18/16   Lysbeth Penner, FNP  penicillin v potassium (VEETID) 500 MG tablet Take 1 tablet (500 mg total) by mouth 3 (three) times daily. 06/23/12   Hazel Sams, PA-C  PENICILLIN V POTASSIUM PO Take 1 tablet by mouth.    [provider]  predniSONE (DELTASONE) 50 MG tablet Take 1 pill daily for 5 days. 05/06/15   Melony Overly, MD    Family History Family History  Problem Relation Age of Onset  . Colon cancer Maternal Grandmother     Social History Social History   Tobacco Use  . Smoking status: Current Every Day Smoker  . Smokeless tobacco: Never Used  Substance Use Topics  . Alcohol use: Yes    Alcohol/week: 0.5 oz    Types: 1 Standard drinks or equivalent per week  . Drug use: No     Allergies   Patient has no known allergies.   Review of Systems Review of Systems  Constitutional: Positive for fever (Subjective).  Respiratory: Positive for cough. Negative for shortness of breath.   Cardiovascular: Negative for chest pain.  Gastrointestinal: Negative for nausea and vomiting.  Genitourinary: Negative for hematuria.     Physical Exam Updated Vital Signs BP (!) 165/79 (BP Location: Right Arm)   Pulse 100   Temp 98.6 F (37 C) (Oral)   Resp 18   SpO2 97%   Physical Exam  Constitutional: He is oriented to person, place, and time. He appears well-developed and well-nourished.  HENT:  Head: Normocephalic and atraumatic.  Nose: Mucosal edema and rhinorrhea present.  Mouth/Throat: Oropharynx is clear and moist and mucous membranes are normal.  Eyes: Conjunctivae, EOM and lids are normal. Pupils are equal, round, and reactive to light.  Neck: Full passive range of motion without pain.  Cardiovascular: Normal rate, regular rhythm, normal heart sounds and normal pulses. Exam reveals no gallop and no friction rub.  No murmur heard. Pulmonary/Chest: Effort normal. He has wheezes.  No evidence of respiratory distress. Able to speak in full sentences without difficulty. Diffuse expiratory wheezes to the lower lung bases bilaterally  Abdominal: Soft. Normal appearance. There is no tenderness. There is no rigidity and no guarding.  Musculoskeletal: Normal range of motion.  Neurological: He  is alert and oriented to person, place, and time.  Skin: Skin is warm and dry. Capillary refill takes less than 2 seconds.  Psychiatric: He has a normal mood and affect. His speech is normal.  Nursing note and vitals reviewed.    ED Treatments / Results  Labs (all labs ordered are listed, but only abnormal results are displayed) Labs Reviewed - No data to display  EKG  EKG Interpretation None       Radiology Dg Chest 2 View  Result Date: 02/24/2017 CLINICAL DATA:  Chest congestion and with cough and wheezing for 1 week. EXAM: CHEST  2 VIEW COMPARISON:  Chest x-ray 05/06/2015 FINDINGS: The cardiac silhouette, mediastinal and hilar contours within normal limits and stable. The lungs are clear. No pleural effusion. The bony thorax is intact. IMPRESSION: No acute cardiopulmonary findings Electronically Signed  By: Marijo Sanes M.D.   On: 02/24/2017 11:03    Procedures Procedures (including critical care time)  Medications Ordered in ED Medications  ipratropium-albuterol (DUONEB) 0.5-2.5 (3) MG/3ML nebulizer solution 3 mL (3 mLs Nebulization Given 02/24/17 1003)     Initial Impression / Assessment and Plan / ED Course  I have reviewed the triage vital signs and the nursing notes.  Pertinent labs & imaging results that were available during my care of the patient were reviewed by me and considered in my medical decision making (see chart for details).     56 year old male with past medical history of hypertension who presents for evaluation of 1.5 weeks of cough, nasal congestion and rhinorrhea.  Patient also reports subjective fever.  [facility] and diagnosed with bronchitis.  Prescribed prednisone taper and Tessalon Perles, which she has been taking.  Comes today because his cough has continued to persist.  No other medications tried. Patient is afebrile, non-toxic appearing, sitting comfortably on examination table. Vital signs reviewed and stable.  No  evidence of respiratory distress.  Patient does have some diffuse expiratory wheezing throughout the lower lung bases bilaterally.  Consider bronchitis versus pneumonia.  Chest x-ray ordered at triage.  We will plan to give DuoNeb for for wheezing.  Chest x-ray reviewed.  Negative for any acute infectious etiology.  Symptoms likely result of orchitis.  Discussed results with patient.  Reevaluation after nebulizer treatment.  Wheezing in the lower lung bases has improved significantly.  Patient reports improvement.  No evidence of respiratory distress.  Able speak in full sentences without difficulty.  He is ambulating in the department without any difficulty.  Encourage continued use of prednisone and Tessalon Perles for symptomatic relief.  We will add additional fluticasone and albuterol inhaler.  Conservative therapies discussed with patient.  Patient encouraged to follow-up with primary care doctor the next 24-48 hours for further evaluation. Patient had ample opportunity for questions and discussion. All patient's questions were answered with full understanding. Strict return precautions discussed. Patient expresses understanding and agreement to plan.    Final Clinical Impressions(s) / ED Diagnoses   Final diagnoses:  Bronchitis    ED Discharge Orders        Ordered    fluticasone (FLONASE) 50 MCG/ACT nasal spray  Daily     02/24/17 1115    albuterol (PROVENTIL HFA;VENTOLIN HFA) 108 (90 Base) MCG/ACT inhaler  Every 6 hours PRN     02/24/17 1115       Volanda Napoleon, PA-C 02/24/17 1608    Margette Fast, MD 02/25/17 [id number]

## 2017-02-24 NOTE — ED Notes (Signed)
Pt transported to xray via wheelchair at this time

## 2017-03-01 ENCOUNTER — Other Ambulatory Visit: Payer: Self-pay

## 2017-03-01 ENCOUNTER — Emergency Department (HOSPITAL_COMMUNITY)
Admission: EM | Admit: 2017-03-01 | Discharge: 2017-03-01 | Disposition: A | Discharge status: Home / Self Care | Payer: BC Managed Care – PPO | Attending: Emergency Medicine | Admitting: Emergency Medicine

## 2017-03-01 ENCOUNTER — Encounter (HOSPITAL_COMMUNITY): Payer: Self-pay

## 2017-03-01 DIAGNOSIS — Z79899 Other long term (current) drug therapy: Secondary | ICD-10-CM | POA: Diagnosis not present

## 2017-03-01 DIAGNOSIS — R0789 Other chest pain: Secondary | ICD-10-CM | POA: Insufficient documentation

## 2017-03-01 DIAGNOSIS — R197 Diarrhea, unspecified: Secondary | ICD-10-CM | POA: Insufficient documentation

## 2017-03-01 DIAGNOSIS — F17200 Nicotine dependence, unspecified, uncomplicated: Secondary | ICD-10-CM | POA: Diagnosis not present

## 2017-03-01 DIAGNOSIS — Z7982 Long term (current) use of aspirin: Secondary | ICD-10-CM | POA: Insufficient documentation

## 2017-03-01 DIAGNOSIS — R05 Cough: Secondary | ICD-10-CM | POA: Diagnosis present

## 2017-03-01 DIAGNOSIS — I10 Essential (primary) hypertension: Secondary | ICD-10-CM | POA: Insufficient documentation

## 2017-03-01 DIAGNOSIS — R61 Generalized hyperhidrosis: Secondary | ICD-10-CM | POA: Diagnosis not present

## 2017-03-01 DIAGNOSIS — R059 Cough, unspecified: Secondary | ICD-10-CM

## 2017-03-01 MED ORDER — DOXYLAMINE SUCCINATE (SLEEP) 25 MG PO TABS: 25.0000 mg | ORAL_TABLET | Freq: Every evening | ORAL | 0 refills | Status: DC | PRN

## 2017-03-01 MED ORDER — GUAIFENESIN ER 600 MG PO TB12: 1200.0000 mg | ORAL_TABLET | Freq: Two times a day (BID) | ORAL | 0 refills | Status: AC

## 2017-03-01 MED ORDER — AZITHROMYCIN 250 MG PO TABS: 250.0000 mg | ORAL_TABLET | Freq: Every day | ORAL | 0 refills | Status: DC

## 2017-03-01 NOTE — ED Provider Notes (Signed)
[name] Provider Note   CSN: [id number] Arrival date & time: 03/01/17  1207     History   Chief Complaint Chief Complaint  Patient presents with  . Cough    HPI Levi Blankenship is a 56 y.o. male.  HPI   Patient is a 56 year old male with a history of hypertension, asthma, and sleep apnea presenting for cough for 2.5 weeks. Patient reports he was evaluated by urgent care and given prednisone and Tessalon Perles, and further evaluated and given an inhaler. Patient reports that his cough has persisted and he has had difficulty sleeping at night. Cough is occasionally productive of sputum, however patient feels that he has mucus in his chest. Patient reports that the cough will come paroxysmally particularly in the night and he will have dental to catching his breath afterwards. Patient had an episode of posttussive vomiting yesterday, and this happened a few other times during the course of the illness. No fevers recorded at home, however patient reports breaking out in occasional sweats. Patient reports that this illness initially began with congestion which has since resolved. No sore throat. Patient reports that he has felt a bilateral tightness in the thorax has been particularly worse on the right. Patient denies any unilateral leg swelling, history/PE, recent immobilization, or hospitalization, or recent surgeries. Patient is a smoker, however he has not been able to smoke in a week.  Past Medical History:  Diagnosis Date  . Hypertension   . Sleep apnea    not currently using c-pap    There are no active problems to display for this patient.   Past Surgical History:  Procedure Laterality Date  . TONSILLECTOMY    . tumor neck     benign and removed       Home Medications    Prior to Admission medications   Medication Sig Start Date End Date Taking? Authorizing Provider  albuterol (PROVENTIL HFA;VENTOLIN HFA) 108 (90 Base)  MCG/ACT inhaler Inhale 1-2 puffs into the lungs every 6 (six) hours as needed for wheezing or shortness of breath. 02/24/17   Volanda Napoleon, PA-C  amoxicillin-clavulanate (AUGMENTIN) 875-125 MG tablet Take 1 tablet by mouth 2 (two) times daily. 01/25/15   Willeen Niece, MD  aspirin EC 81 MG tablet Take 81 mg by mouth daily.    [provider]  azithromycin (ZITHROMAX) 250 MG tablet Take 1 tablet (250 mg total) by mouth daily. Take first 2 tablets together, then 1 every day until finished. 03/01/17   Langston Masker B, PA-C  benzonatate (TESSALON) 100 MG capsule Take 2 capsules (200 mg total) by mouth 3 (three) times daily as needed for cough. 08/18/16   Lysbeth Penner, FNP  cetirizine (ZYRTEC) 10 MG tablet Take 10 mg by mouth daily.    [provider]  cyclobenzaprine (FLEXERIL) 5 MG tablet Take 1 tablet (5 mg total) by mouth 3 (three) times daily as needed for muscle spasms. 01/05/15   Billy Fischer, MD  doxylamine, Sleep, (UNISOM) 25 MG tablet Take 1 tablet (25 mg total) by mouth at bedtime as needed. 03/01/17   Langston Masker B, PA-C  felodipine (PLENDIL) 10 MG 24 hr tablet Take 10 mg by mouth daily.    [provider]  fluticasone (FLONASE) 50 MCG/ACT nasal spray Place 1 spray into both nostrils daily. 02/24/17   Volanda Napoleon, PA-C  guaiFENesin (MUCINEX) 600 MG 12 hr tablet Take 2 tablets (1,200 mg total) by mouth  2 (two) times daily for 7 days. 03/01/17 03/08/17  Langston Masker B, PA-C  HYDROcodone-acetaminophen (NORCO) 5-325 MG per tablet Take 1 tablet by mouth every 4 (four) hours as needed for pain. 06/23/12   Dammen, Collier [place], PA-C  HYDROcodone-homatropine (HYCODAN) 5-1.5 MG/5ML syrup Take 5 mLs by mouth every 6 (six) hours as needed for cough. 05/06/15   Melony Overly, MD  ipratropium (ATROVENT) 0.06 % nasal spray Place 2 sprays into both nostrils 4 (four) times daily. 08/18/16   Lysbeth Penner, FNP  lisinopril (PRINIVIL,ZESTRIL) 10 MG tablet  03/03/12    [provider]  methylPREDNISolone (MEDROL DOSEPAK) 4 MG TBPK tablet follow package directions, start on Friday. 01/05/15   Billy Fischer, MD  methylPREDNISolone (MEDROL DOSEPAK) 4 MG TBPK tablet Take 6-5-4-3-2-1 po qd 08/18/16   Lysbeth Penner, FNP  penicillin v potassium (VEETID) 500 MG tablet Take 1 tablet (500 mg total) by mouth 3 (three) times daily. 06/23/12   Hazel Sams, PA-C  PENICILLIN V POTASSIUM PO Take 1 tablet by mouth.    [provider]  predniSONE (DELTASONE) 50 MG tablet Take 1 pill daily for 5 days. 05/06/15   Melony Overly, MD    Family History Family History  Problem Relation Age of Onset  . Colon cancer Maternal Grandmother     Social History Social History   Tobacco Use  . Smoking status: Current Every Day Smoker  . Smokeless tobacco: Never Used  Substance Use Topics  . Alcohol use: Yes    Alcohol/week: 0.5 oz    Types: 1 Standard drinks or equivalent per week  . Drug use: No     Allergies   Patient has no known allergies.   Review of Systems Review of Systems  Constitutional: Positive for diaphoresis. Negative for fever.  HENT: Negative for congestion, rhinorrhea and sore throat.   Respiratory: Positive for cough and shortness of breath. Negative for wheezing.   Cardiovascular: Negative for leg swelling.  Gastrointestinal: Positive for vomiting. Negative for nausea.  Musculoskeletal: Negative for myalgias.     Physical Exam Updated Vital Signs BP 133/84 (BP Location: Right Arm)   Pulse (!) 117   Temp 98.6 F (37 C) (Oral)   Resp 20   Ht 5\' 10"  (1.778 m)   Wt 117.9 kg (260 lb)   SpO2 97%   BMI 37.31 kg/m   Physical Exam  Constitutional: He appears well-developed and well-nourished. No distress.  HENT:  Head: Normocephalic and atraumatic.  Mouth/Throat: Oropharynx is clear and moist.  Eyes: Conjunctivae and EOM are normal. Pupils are equal, round, and reactive to light.  Neck: Normal range of motion. Neck supple.    Cardiovascular: Normal rate, regular rhythm, S1 normal and S2 normal.  No murmur heard. Radial pulse checked by me. Patient not tachycardic.  Pulmonary/Chest: Effort normal and breath sounds normal. He has no wheezes. He has no rales.  Normal respiratory effort. Good air movement.  Abdominal: Soft. He exhibits no distension. There is no tenderness. There is no guarding.  Musculoskeletal: Normal range of motion. He exhibits no edema or deformity.  Lymphadenopathy:    He has no cervical adenopathy.  Neurological: He is alert.  Cranial nerves grossly intact.  Patient was externally symmetrically and with good coordination.  Skin: Skin is warm and dry. No rash noted. No erythema.  Psychiatric: He has a normal mood and affect. His behavior is normal. Judgment and thought content normal.  Nursing note and vitals reviewed.  ED Treatments / Results  Labs (all labs ordered are listed, but only abnormal results are displayed) Labs Reviewed - No data to display  EKG  EKG Interpretation None       Radiology No results found.  Procedures Procedures (including critical care time)  Medications Ordered in ED Medications - No data to display   Initial Impression / Assessment and Plan / ED Course  I have reviewed the triage vital signs and the nursing notes.  Pertinent labs & imaging results that were available during my care of the patient were reviewed by me and considered in my medical decision making (see chart for details).      Final Clinical Impressions(s) / ED Diagnoses   Final diagnoses:  Cough   Patient is nontoxic-appearing, afebrile and in no acute distress. One episode of paroxysmal coughing was observed in the emergency department. Patient exhibits good air movement on pulmonary exam. Patient converses comfortably and has no audible wheeze or stridor. There are no wheezes or rales auscultated on exam. Doubt pneumonia. Doubt new onset COPD. Doubt pulmonary embolism  as cause of patient's thoracic pain as other diagnoses more likely, and patient's only risk factor is age. I reviewed the chest x-ray that was performed 3 days ago and it demonstrated no consolidation. No concerning adenopathy seen on that exam. I do not feel even the pulmonary exam today that patient warrants a repeat chest x-ray at this time.   Patient does shows some historical features consistent with possible pertussis. I discussed this with the patient. I discussed empiric treatment versus falling with primary care provider. I also discussed that 2.5 weeks of coughing is within the realm of acute bronchitis and further workup may be indicated if the cough is occurring chronically past 4 weeks. Patient does have a primary care provider and stated that he will follow-up next week. Patient was given return precautions for any shortness of breath occurring outside episodes of his cough or worsening chest pain. Patient is understanding and agrees with the plan of care.  ED Discharge Orders        Ordered    azithromycin (ZITHROMAX) 250 MG tablet  Daily     03/01/17 1736    guaiFENesin (MUCINEX) 600 MG 12 hr tablet  2 times daily     03/01/17 1736    doxylamine, Sleep, (UNISOM) 25 MG tablet  At bedtime PRN     03/01/17 1739       Albesa Seen, PA-C 03/01/17 1749    Virgel Manifold, MD 03/04/17 1148

## 2017-03-01 NOTE — ED Triage Notes (Signed)
Pt seen in ED 02-24-17 for bronchitis.  Pt reports cough has not improved.  Onset cough x 2 1/2 weeks.   Pt has been using inhaler and Flonase.  Voice hoarse, no respiratory distress.

## 2017-03-01 NOTE — Discharge Instructions (Signed)
Please see the information and instructions below regarding your visit.  Your diagnoses today include:  1. Cough   Your cough is still within the realm of a diagnosis of acute cough. A diagnosis of chronic cough comes after a month of cough.  There are many causes of cough. Given the history of paroxysmal coughing and vomiting after cough, it is reasonable to treat for pertussis empirically at this point. This is a bacterial infection. Please talk with her primary care provider about vaccination for pertussis.  Tests performed today include: See side panel of your discharge paperwork for testing performed today. Vital signs are listed at the bottom of these instructions.   I reviewed your chest x-ray from 3 days ago and it looked good.  Medications prescribed:    Take any prescribed medications only as prescribed, and any over the counter medications only as directed on the packaging.\Please take all of your antibiotics until finished.   You may develop abdominal discomfort or nausea from the antibiotic. If this occurs, you may take it with food. Some patients also get diarrhea with antibiotics. You may help offset this with probiotics which you can buy or get in yogurt. Do not eat or take the probiotics until 2 hours after your antibiotic.  Some people develop allergies to antibiotics. Symptoms of antibiotic allergy can be mild and include a flat rash and itching. They can also be more serious and include:  ?Hives - Hives are raised, red patches of skin that are usually very itchy.  ?Lip or tongue swelling  ?Trouble swallowing or breathing  ?Blistering of the skin or mouth.  If you have any of these serious symptoms, please seek emergency medical care immediately.  You take 5 mg of melatonin over-the-counter. You may also use Benadryl or Unisom.   Home care instructions:  Please follow any educational materials contained in this packet.   Follow-up instructions: Please follow-up  with your primary care provider next week for further evaluation of your symptoms if they are not completely improved.   Return instructions:  Please return to the Emergency Department if you experience worsening symptoms.  Please return to the emergency Department if you develop any shortness of breath that occurs even while at rest, or worsening chest pain. Please return if you have any other emergent concerns.  Additional Information:   Your vital signs today were: BP 133/84 (BP Location: Right Arm)    Pulse (!) 117    Temp 98.6 F (37 C) (Oral)    Resp 20    Ht 5\' 10"  (1.778 m)    Wt 117.9 kg (260 lb)    SpO2 97%    BMI 37.31 kg/m  If your blood pressure (BP) was elevated on multiple readings during this visit above 130 for the top number or above 80 for the bottom number, please have this repeated by your primary care provider within one month. --------------  Thank you for allowing [place] to participate in your care today.

## 2017-04-13 DIAGNOSIS — Z6841 Body Mass Index (BMI) 40.0 and over, adult: Secondary | ICD-10-CM | POA: Insufficient documentation

## 2017-04-13 DIAGNOSIS — G4733 Obstructive sleep apnea (adult) (pediatric): Secondary | ICD-10-CM | POA: Insufficient documentation

## 2017-04-29 ENCOUNTER — Other Ambulatory Visit (HOSPITAL_BASED_OUTPATIENT_CLINIC_OR_DEPARTMENT_OTHER): Payer: Self-pay

## 2017-04-29 DIAGNOSIS — G4733 Obstructive sleep apnea (adult) (pediatric): Secondary | ICD-10-CM

## 2017-05-05 ENCOUNTER — Ambulatory Visit (HOSPITAL_BASED_OUTPATIENT_CLINIC_OR_DEPARTMENT_OTHER): Payer: BC Managed Care – PPO | Attending: Family Medicine | Admitting: Internal Medicine

## 2017-05-05 VITALS — Ht 71.0 in | Wt 306.0 lb

## 2017-05-05 DIAGNOSIS — G4733 Obstructive sleep apnea (adult) (pediatric): Secondary | ICD-10-CM | POA: Diagnosis not present

## 2017-05-20 DIAGNOSIS — G4733 Obstructive sleep apnea (adult) (pediatric): Secondary | ICD-10-CM | POA: Diagnosis not present

## 2017-05-20 NOTE — Procedures (Signed)
Patient Name: Levi Blankenship, Levi Blankenship Date: 05/05/2017 Gender: Male D.O.B: Oct 06, 1960 Age (years): 77 Referring Provider: Venida Jarvis MD Height (inches): 71 Interpreting Physician: Baird Lyons MD, ABSM Weight (lbs): 306 RPSGT: Carolin Coy BMI: 26 MRN: [id number] Neck Size: 19.00 CLINICAL INFORMATION Sleep Study Type: Split Night CPAP Indication for sleep study: Fatigue, Hypertension, Obesity, Snoring  Epworth Sleepiness Score: 18  SLEEP STUDY TECHNIQUE As per the AASM Manual for the Scoring of Sleep and Associated Events v2.3 (April 2016) with a hypopnea requiring 4% desaturations.  The channels recorded and monitored were frontal, central and occipital EEG, electrooculogram (EOG), submentalis EMG (chin), nasal and oral airflow, thoracic and abdominal wall motion, anterior tibialis EMG, snore microphone, electrocardiogram, and pulse oximetry. Continuous positive airway pressure (CPAP) was initiated when the patient met split night criteria and was titrated according to treat sleep-disordered breathing.  MEDICATIONS Medications self-administered by patient taken the night of the study : none reported  RESPIRATORY PARAMETERS Diagnostic  Total AHI (/hr): 72.0 RDI (/hr): 77.5 OA Index (/hr): 17.5 CA Index (/hr): 5.0 REM AHI (/hr): 53.1 NREM AHI (/hr): 78.4 Supine AHI (/hr): 75.1 Non-supine AHI (/hr): 68.26 Min O2 Sat (%): 83.00 Mean O2 (%): 95.23 Time below 88% (min): 1.2   Titration  Optimal Pressure (cm): 17 AHI at Optimal Pressure (/hr): 0.0 Min O2 at Optimal Pressure (%): 96.0 Supine % at Optimal (%): 0 Sleep % at Optimal (%): 100   SLEEP ARCHITECTURE The recording time for the entire night was 435.6 minutes.  During a baseline period of 243.1 minutes, the patient slept for 120.0 minutes in REM and nonREM, yielding a sleep efficiency of 49.4%. Sleep onset after lights out was 11.3 minutes with a REM latency of 6.5 minutes. The patient spent 60.83% of the night in  stage N1 sleep, 13.75% in stage N2 sleep, 0.00% in stage N3 and 25.42% in REM.  During the titration period of 186.8 minutes, the patient slept for 158.3 minutes in REM and nonREM, yielding a sleep efficiency of 84.8%. Sleep onset after CPAP initiation was 2.5 minutes with a REM latency of 121.0 minutes. The patient spent 30.32% of the night in stage N1 sleep, 47.26% in stage N2 sleep, 0.00% in stage N3 and 22.42% in REM.  CARDIAC DATA The 2 lead EKG demonstrated sinus rhythm. The mean heart rate was 80.89 beats per minute. Other EKG findings include: PVCs.  LEG MOVEMENT DATA The total Periodic Limb Movements of Sleep (PLMS) were 32. The PLMS index was 6.90 .  IMPRESSIONS - Severe obstructive sleep apnea occurred during the diagnostic portion of the study (AHI = 72.0/hour). 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 - Mild oxygen desaturation was noted during the diagnostic portion of the study (Min O2 = 83.00%). - The patient snored with moderate snoring volume during the diagnostic portion of the study. - EKG findings include PVCs. - Clinically significant periodic limb movements did not occur during sleep.  DIAGNOSIS - Obstructive Sleep Apnea (327.23 [G47.33 ICD-10])  RECOMMENDATIONS - Trial of CPAP therapy on 17 cm H2O with a Medium Wide size Philips Respironics Full Face Mask Dreamwear mask and heated humidification. - Be careful with alcohol, sedatives and other CNS depressants that may worsen sleep apnea and disrupt normal sleep architecture. - Sleep hygiene should be reviewed to assess factors that may improve sleep quality. - Weight management and regular  exercise should be initiated or continued.  [Electronically signed] 05/20/2017 01:15 PM  Baird Lyons MD, [place], American Board of Sleep Medicine   NPI: [id number]                         [place], [facility]  ELECTRONICALLY SIGNED ON:  05/20/2017, 1:14 PM [place] PH: [phone]   FX: [phone] [place]

## 2017-10-20 ENCOUNTER — Encounter (HOSPITAL_COMMUNITY): Payer: Self-pay

## 2017-10-20 ENCOUNTER — Ambulatory Visit (HOSPITAL_COMMUNITY)
Admission: EM | Admit: 2017-10-20 | Discharge: 2017-10-20 | Disposition: A | Discharge status: Home / Self Care | Payer: BC Managed Care – PPO | Attending: Emergency Medicine | Admitting: Emergency Medicine

## 2017-10-20 DIAGNOSIS — S86811A Strain of other muscle(s) and tendon(s) at lower leg level, right leg, initial encounter: Secondary | ICD-10-CM

## 2017-10-20 MED ORDER — NAPROXEN 375 MG PO TABS: 375.0000 mg | ORAL_TABLET | Freq: Two times a day (BID) | ORAL | 0 refills | Status: DC

## 2017-10-20 NOTE — Discharge Instructions (Signed)
Ice, elevate your leg to help with pain.  May use ACE wrap for comfort.  Light and regularly activity/stretching  Naproxen twice a day, take with food. Don't take additional ibuprofen.  If symptoms worsen or do not improve in the next week to  to follow up with your PCP or with sports medicine (or who did your heel injection)

## 2017-10-20 NOTE — ED Triage Notes (Signed)
Pt presents with pain in right calf area from fall yesterday

## 2017-10-20 NOTE — ED Provider Notes (Signed)
Levi Blankenship    CSN: [id number] Arrival date & time: 10/20/17  0801     History   Chief Complaint Chief Complaint  Patient presents with  . Leg Pain    right leg    HPI Levi Blankenship is a 57 y.o. male.   Levi Blankenship presents with pain to his right calf after a fall last night. He was walking into his home when his slipper caught on the door frame causing him to trip and fall forward with immediate right calf pain. No other injury. Pain has not worsened but minimal improvement. He feels that it had been more swollen which has improved. No numbness or tingling. He did have a cortisone injection to his right heel recently, no heel pain. Pain 9/10. Took ibuprofen at 0200 this am which did not help. Denies any previous injury. No knee pain. No fevers or warmth. Hx of htn, allergies.     ROS per HPI.      Past Medical History:  Diagnosis Date  . Hypertension   . Sleep apnea    not currently using c-pap    There are no active problems to display for this patient.   Past Surgical History:  Procedure Laterality Date  . TONSILLECTOMY    . tumor neck     benign and removed       Home Medications    Prior to Admission medications   Medication Sig Start Date End Date Taking? Authorizing Provider  albuterol (PROVENTIL HFA;VENTOLIN HFA) 108 (90 Base) MCG/ACT inhaler Inhale 1-2 puffs into the lungs every 6 (six) hours as needed for wheezing or shortness of breath. 02/24/17   Volanda Napoleon, PA-C  aspirin EC 81 MG tablet Take 81 mg by mouth daily.    [provider]  cetirizine (ZYRTEC) 10 MG tablet Take 10 mg by mouth daily.    [provider]  cyclobenzaprine (FLEXERIL) 5 MG tablet Take 1 tablet (5 mg total) by mouth 3 (three) times daily as needed for muscle spasms. 01/05/15   Billy Fischer, MD  doxylamine, Sleep, (UNISOM) 25 MG tablet Take 1 tablet (25 mg total) by mouth at bedtime as needed. 03/01/17   Langston Masker B, PA-C  felodipine  (PLENDIL) 10 MG 24 hr tablet Take 10 mg by mouth daily.    [provider]  fluticasone (FLONASE) 50 MCG/ACT nasal spray Place 1 spray into both nostrils daily. 02/24/17   [place] Lanius A, PA-C  ipratropium (ATROVENT) 0.06 % nasal spray Place 2 sprays into both nostrils 4 (four) times daily. 08/18/16   Lysbeth Penner, FNP  lisinopril (PRINIVIL,ZESTRIL) 10 MG tablet  03/03/12   [provider]  naproxen (NAPROSYN) 375 MG tablet Take 1 tablet (375 mg total) by mouth 2 (two) times daily. 10/20/17   Zigmund Gottron, NP    Family History Family History  Problem Relation Age of Onset  . Colon cancer Maternal Grandmother     Social History Social History   Tobacco Use  . Smoking status: Current Every Day Smoker  . Smokeless tobacco: Never Used  Substance Use Topics  . Alcohol use: Yes    Alcohol/week: 0.6 oz    Types: 1 Standard drinks or equivalent per week  . Drug use: No     Allergies   Patient has no known allergies.   Review of Systems Review of Systems   Physical Exam Triage Vital Signs ED Triage Vitals  Enc Vitals Group  BP 10/20/17 0817 (!) 124/91     Pulse Rate 10/20/17 0817 100     Resp 10/20/17 0817 20     Temp 10/20/17 0817 98.5 F (36.9 C)     Temp Source 10/20/17 0817 Oral     SpO2 10/20/17 0817 99 %     Weight --      Height --      Head Circumference --      Peak Flow --      Pain Score 10/20/17 0816 9     Pain Loc --      Pain Edu? --      Excl. in [place]? --    No data found.  Updated Vital Signs BP (!) 124/91 (BP Location: Left Arm)   Pulse 100   Temp 98.5 F (36.9 C) (Oral)   Resp 20   SpO2 99%   Visual Acuity Right Eye Distance:   Left Eye Distance:   Bilateral Distance:    Right Eye Near:   Left Eye Near:    Bilateral Near:     Physical Exam  Constitutional: He is oriented to person, place, and time. He appears well-developed and well-nourished.  Cardiovascular: Normal rate and regular rhythm.    Pulmonary/Chest: Effort normal and breath sounds normal.  Musculoskeletal:       Right knee: Normal.       Right ankle: Normal.       Left lower leg: He exhibits tenderness. He exhibits no bony tenderness, no swelling, no edema, no deformity and no laceration.  Generalized proximal right calf tenderness on palpation; soft; no redness or swelling; full flexion and dorsiflexion of ankle although with mild pain; mild pain with passive dorsiflexion but tolerates; ambulatory; strong pedal pulse; sensation intact   Neurological: He is alert and oriented to person, place, and time.  Skin: Skin is warm and dry.     UC Treatments / Results  Labs (all labs ordered are listed, but only abnormal results are displayed) Labs Reviewed - No data to display  EKG None  Radiology No results found.  Procedures Procedures (including critical care time)  Medications Ordered in UC Medications - No data to display  Initial Impression / Assessment and Plan / UC Course  I have reviewed the triage vital signs and the nursing notes.  Pertinent labs & imaging results that were available during my care of the patient were reviewed by me and considered in my medical decision making (see chart for details).     Recent right heel injection, no heel pain. Proximal calf pain, soft. Ambulatory. Flexion and dorsiflexion remain intact. No firmness, redness, warmth. Pain s/p injury, no indication of DVT at this time. Ice, elevation, ace wrap, nsaids. If symptoms worsen or do not improve in the next week to return to be seen or to follow up with PCP or sports med. Patient verbalized understanding and agreeable to plan.   Final Clinical Impressions(s) / UC Diagnoses   Final diagnoses:  Strain of calf muscle, right, initial encounter     Discharge Instructions     Ice, elevate your leg to help with pain.  May use ACE wrap for comfort.  Light and regularly activity/stretching  Naproxen twice a day, take with  food. Don't take additional ibuprofen.  If symptoms worsen or do not improve in the next week to  to follow up with your PCP or with sports medicine (or who did your heel injection)      ED  Prescriptions    Medication Sig Dispense Auth. Provider   naproxen (NAPROSYN) 375 MG tablet Take 1 tablet (375 mg total) by mouth 2 (two) times daily. 20 tablet Zigmund Gottron, NP     Controlled Substance Prescriptions [place] Controlled Substance Registry consulted? Not Applicable   Zigmund Gottron, NP 10/20/17 [phone]

## 2017-11-02 ENCOUNTER — Emergency Department (HOSPITAL_BASED_OUTPATIENT_CLINIC_OR_DEPARTMENT_OTHER): Payer: BC Managed Care – PPO

## 2017-11-02 ENCOUNTER — Encounter (HOSPITAL_COMMUNITY): Payer: Self-pay | Admitting: Emergency Medicine

## 2017-11-02 ENCOUNTER — Emergency Department (HOSPITAL_COMMUNITY)
Admission: EM | Admit: 2017-11-02 | Discharge: 2017-11-02 | Disposition: A | Discharge status: Home / Self Care | Payer: BC Managed Care – PPO | Attending: Emergency Medicine | Admitting: Emergency Medicine

## 2017-11-02 ENCOUNTER — Other Ambulatory Visit: Payer: Self-pay

## 2017-11-02 DIAGNOSIS — S86111A Strain of other muscle(s) and tendon(s) of posterior muscle group at lower leg level, right leg, initial encounter: Secondary | ICD-10-CM | POA: Insufficient documentation

## 2017-11-02 DIAGNOSIS — Y929 Unspecified place or not applicable: Secondary | ICD-10-CM | POA: Insufficient documentation

## 2017-11-02 DIAGNOSIS — Z79899 Other long term (current) drug therapy: Secondary | ICD-10-CM | POA: Diagnosis not present

## 2017-11-02 DIAGNOSIS — Y9301 Activity, walking, marching and hiking: Secondary | ICD-10-CM | POA: Diagnosis not present

## 2017-11-02 DIAGNOSIS — F17210 Nicotine dependence, cigarettes, uncomplicated: Secondary | ICD-10-CM | POA: Insufficient documentation

## 2017-11-02 DIAGNOSIS — M79609 Pain in unspecified limb: Secondary | ICD-10-CM | POA: Diagnosis not present

## 2017-11-02 DIAGNOSIS — Y999 Unspecified external cause status: Secondary | ICD-10-CM | POA: Insufficient documentation

## 2017-11-02 DIAGNOSIS — W010XXA Fall on same level from slipping, tripping and stumbling without subsequent striking against object, initial encounter: Secondary | ICD-10-CM | POA: Insufficient documentation

## 2017-11-02 DIAGNOSIS — M79604 Pain in right leg: Secondary | ICD-10-CM | POA: Diagnosis present

## 2017-11-02 DIAGNOSIS — I10 Essential (primary) hypertension: Secondary | ICD-10-CM | POA: Insufficient documentation

## 2017-11-02 NOTE — ED Triage Notes (Signed)
Pt. Stated, I went to UC last Sunday , I had pain rt. Lower leg, in my calf. They said it was a pulled muscle. It still hurts and I walk a lot.

## 2017-11-02 NOTE — Progress Notes (Signed)
VASCULAR LAB PRELIMINARY  PRELIMINARY  PRELIMINARY  PRELIMINARY  Right lower extremity venous duplex completed.    Preliminary report:  There is no DVT or SVT noted in the right lower extremity.  There is an area in the mid posterior calf that may be consistent with a muscle tear.  Levi Blankenship, RVT 11/02/2017, 11:25 AM

## 2017-11-02 NOTE — ED Notes (Signed)
Pt endorses falling last Sunday at his home by accident and "heard a pop" in his calf upon falling. Upon assessment pt has swollen right calf, warm and hard to the touch. Pedal pulse intact via doppler. Pt ambulatory

## 2017-11-02 NOTE — Progress Notes (Signed)
Orthopedic Tech Progress Note Patient Details:  Levi Blankenship. [place] 08-Nov-1960 [id number]  Ortho Devices Type of Ortho Device: Crutches, CAM walker Ortho Device/Splint Location: rle Ortho Device/Splint Interventions: Application   Post Interventions Patient Tolerated: Well Instructions Provided: Care of device   Hildred Priest 11/02/2017, 12:43 PM

## 2017-11-02 NOTE — ED Notes (Signed)
Pt taken to vascular

## 2017-11-02 NOTE — Discharge Instructions (Addendum)
Please read attached information. If you experience any new or worsening signs or symptoms please return to the emergency room for evaluation. Please follow-up with your primary care provider or specialist as discussed.Please use ibuprofen or tylenol as needed for discomfort

## 2017-11-02 NOTE — ED Provider Notes (Addendum)
[facility] EMERGENCY DEPARTMENT Provider Note   CSN: [id number] Arrival date & time: 11/02/17  1245     History   Chief Complaint Chief Complaint  Patient presents with  . Leg Pain    calf pain    HPI Levi Blankenship is a 57 y.o. male.  HPI    57 year old male presents today with complaints of pain to his right calf.  Patient notes that on 10/19/2017 he was walking in his home when his slipper caught on door frame causing him to trip and fall forward with immediate pain to his right posterior calf.  Patient notes he was able to walk after the incident, he noted he did have swelling that began shortly afterwards.  He was seen as an outpatient diagnosed with muscular strain given symptomatic care and discharged with instructions to follow-up if symptoms did not improve.  Patient notes he continues to endorse swelling and pain in the posterior calf, he continues to deny any loss of strength or range of motion of the ankle, no warmth or loss of sensation.  Patient denies any history DVT or PE in the past, reports he is a smoker, no active malignancy or recent surgeries.  Patient notes he has been taking naproxen at home.  Prior to this event patient was receiving cortisone injection in his heel secondary to tendinitis.   Past Medical History:  Diagnosis Date  . Hypertension   . Sleep apnea    not currently using c-pap    There are no active problems to display for this patient.   Past Surgical History:  Procedure Laterality Date  . TONSILLECTOMY    . tumor neck     benign and removed        Home Medications    Prior to Admission medications   Medication Sig Start Date End Date Taking? Authorizing Provider  albuterol (PROVENTIL HFA;VENTOLIN HFA) 108 (90 Base) MCG/ACT inhaler Inhale 1-2 puffs into the lungs every 6 (six) hours as needed for wheezing or shortness of breath. 02/24/17   Volanda Napoleon, PA-C  aspirin EC 81 MG tablet Take 81 mg by mouth  daily.    [provider]  cetirizine (ZYRTEC) 10 MG tablet Take 10 mg by mouth daily.    [provider]  cyclobenzaprine (FLEXERIL) 5 MG tablet Take 1 tablet (5 mg total) by mouth 3 (three) times daily as needed for muscle spasms. 01/05/15   Billy Fischer, MD  doxylamine, Sleep, (UNISOM) 25 MG tablet Take 1 tablet (25 mg total) by mouth at bedtime as needed. 03/01/17   Langston Masker B, PA-C  felodipine (PLENDIL) 10 MG 24 hr tablet Take 10 mg by mouth daily.    [provider]  fluticasone (FLONASE) 50 MCG/ACT nasal spray Place 1 spray into both nostrils daily. 02/24/17   Providence Lanius A, PA-C  ipratropium (ATROVENT) 0.06 % nasal spray Place 2 sprays into both nostrils 4 (four) times daily. 08/18/16   Lysbeth Penner, FNP  lisinopril (PRINIVIL,ZESTRIL) 10 MG tablet  03/03/12   [provider]  naproxen (NAPROSYN) 375 MG tablet Take 1 tablet (375 mg total) by mouth 2 (two) times daily. 10/20/17   Zigmund Gottron, NP    Family History Family History  Problem Relation Age of Onset  . Colon cancer Maternal Grandmother     Social History Social History   Tobacco Use  . Smoking status: Current Every Day Smoker  . Smokeless tobacco: Never Used  Substance Use Topics  . Alcohol use: Yes    Alcohol/week: 1.0 standard drinks    Types: 1 Standard drinks or equivalent per week  . Drug use: No     Allergies   Patient has no known allergies.   Review of Systems Review of Systems  All other systems reviewed and are negative.    Physical Exam Updated Vital Signs BP (!) 148/99 (BP Location: Right Arm)   Pulse 98   Temp 98.3 F (36.8 C) (Oral)   Resp 14   Ht 5\' 11"  (1.803 m)   Wt 136.1 kg   SpO2 100%   BMI 41.84 kg/m   Physical Exam  Constitutional: He is oriented to person, place, and time. He appears well-developed and well-nourished.  HENT:  Head: Normocephalic and atraumatic.  Eyes: Pupils are equal, round, and reactive to light.  Conjunctivae are normal. Right eye exhibits no discharge. Left eye exhibits no discharge. No scleral icterus.  Neck: Normal range of motion. No JVD present. No tracheal deviation present.  Pulmonary/Chest: Effort normal. No stridor.  Musculoskeletal:  Swelling noted to right calf, tenderness to palpation of right posterior calf- no warmth to touch- pedal pulse two plus- achilles tendon intact and non tender- full active resisted range of motion of the ankle   Neurological: He is alert and oriented to person, place, and time. Coordination normal.  Psychiatric: He has a normal mood and affect. His behavior is normal. Judgment and thought content normal.  Nursing note and vitals reviewed.    ED Treatments / Results  Labs (all labs ordered are listed, but only abnormal results are displayed) Labs Reviewed - No data to display  EKG None  Radiology No results found.  Procedures Procedures (including critical care time)  Medications Ordered in ED Medications - No data to display   Initial Impression / Assessment and Plan / ED Course  I have reviewed the triage vital signs and the nursing notes.  Pertinent labs & imaging results that were available during my care of the patient were reviewed by me and considered in my medical decision making (see chart for details).     Labs:   Imaging: Lower extremity venous study  Consults:  Therapeutics:  Discharge Meds:   Assessment/Plan: 57 year old male presents today with likely muscular tear.  Patient has full active range of motion at the ankle, but continued swelling.  Patient will be put in a walking boot, given crutches and given orthopedic follow-up.  Patient will follow-up as an outpatient orthopedic, he will return immediately if develops any new or worsening signs or symptoms.  Patient verbalized understanding and agreement to today's plan had no further questions or concerns at the time discharge.      Final Clinical  Impressions(s) / ED Diagnoses   Final diagnoses:  Strain of right gastrocnemius muscle, initial encounter    ED Discharge Orders    None       Francee Gentile 11/02/17 1139    HedgesDellis Filbert, PA-C 11/02/17 1140    Hayden Rasmussen, MD 11/02/17 1743

## 2017-11-02 NOTE — ED Notes (Signed)
Patient verbalizes understanding of discharge instructions. Opportunity for questioning and answers were provided. Armband removed by staff, pt discharged from ED ambulatory.   

## 2017-11-02 NOTE — ED Notes (Signed)
Ortho aware of need for crutches education

## 2018-01-10 ENCOUNTER — Emergency Department (HOSPITAL_COMMUNITY)
Admission: EM | Admit: 2018-01-10 | Discharge: 2018-01-10 | Disposition: A | Discharge status: Home / Self Care | Payer: BC Managed Care – PPO | Attending: Emergency Medicine | Admitting: Emergency Medicine

## 2018-01-10 ENCOUNTER — Encounter (HOSPITAL_COMMUNITY): Payer: Self-pay | Admitting: Emergency Medicine

## 2018-01-10 ENCOUNTER — Emergency Department (HOSPITAL_COMMUNITY): Payer: BC Managed Care – PPO

## 2018-01-10 ENCOUNTER — Other Ambulatory Visit: Payer: Self-pay

## 2018-01-10 DIAGNOSIS — Z7982 Long term (current) use of aspirin: Secondary | ICD-10-CM | POA: Diagnosis not present

## 2018-01-10 DIAGNOSIS — F17210 Nicotine dependence, cigarettes, uncomplicated: Secondary | ICD-10-CM | POA: Insufficient documentation

## 2018-01-10 DIAGNOSIS — Z79899 Other long term (current) drug therapy: Secondary | ICD-10-CM | POA: Insufficient documentation

## 2018-01-10 DIAGNOSIS — R0789 Other chest pain: Secondary | ICD-10-CM | POA: Diagnosis not present

## 2018-01-10 DIAGNOSIS — M549 Dorsalgia, unspecified: Secondary | ICD-10-CM | POA: Diagnosis present

## 2018-01-10 DIAGNOSIS — R110 Nausea: Secondary | ICD-10-CM | POA: Diagnosis not present

## 2018-01-10 DIAGNOSIS — I10 Essential (primary) hypertension: Secondary | ICD-10-CM | POA: Insufficient documentation

## 2018-01-10 LAB — BASIC METABOLIC PANEL
ANION GAP: 12 (ref 5–15)
BUN: 7 mg/dL (ref 6–20)
CHLORIDE: 102 mmol/L (ref 98–111)
CO2: 24 mmol/L (ref 22–32)
Calcium: 9.1 mg/dL (ref 8.9–10.3)
Creatinine, Ser: 1.03 mg/dL (ref 0.61–1.24)
GFR calc Af Amer: >60 mL/min (ref >60)
GFR calc non Af Amer: >60 mL/min (ref >60)
GLUCOSE: 94 mg/dL (ref 70–99)
POTASSIUM: 3.9 mmol/L (ref 3.5–5.1)
Sodium: 138 mmol/L (ref 135–145)

## 2018-01-10 LAB — CBC
HEMATOCRIT: 46.6 % (ref 39.0–52.0)
HEMOGLOBIN: 14.8 g/dL (ref 13.0–17.0)
MCH: 29.7 pg (ref 26.0–34.0)
MCHC: 31.8 g/dL (ref 30.0–36.0)
MCV: 93.6 fL (ref 80.0–100.0)
NRBC: 0 % (ref 0.0–0.2)
Platelets: 312 10*3/uL (ref 150–400)
RBC: 4.98 MIL/uL (ref 4.22–5.81)
RDW: 13.6 % (ref 11.5–15.5)
WBC: 9.8 10*3/uL (ref 4.0–10.5)

## 2018-01-10 LAB — URINALYSIS, ROUTINE W REFLEX MICROSCOPIC
Bacteria, UA: NONE SEEN
Bilirubin Urine: NEGATIVE
GLUCOSE, UA: NEGATIVE mg/dL
Hgb urine dipstick: NEGATIVE
KETONES UR: NEGATIVE mg/dL
Nitrite: NEGATIVE
PH: 5 (ref 5.0–8.0)
Protein, ur: NEGATIVE mg/dL
SPECIFIC GRAVITY, URINE: 1.018 (ref 1.005–1.030)

## 2018-01-10 LAB — HEPATIC FUNCTION PANEL
ALK PHOS: 72 U/L (ref 38–126)
ALT: 27 U/L (ref 0–44)
AST: 23 U/L (ref 15–41)
Albumin: 3.9 g/dL (ref 3.5–5.0)
BILIRUBIN TOTAL: 0.9 mg/dL (ref 0.3–1.2)
Bilirubin, Direct: 0.2 mg/dL (ref 0.0–0.2)
Indirect Bilirubin: 0.7 mg/dL (ref 0.3–0.9)
Total Protein: 7.3 g/dL (ref 6.5–8.1)

## 2018-01-10 LAB — LIPASE, BLOOD: Lipase: 47 U/L (ref 11–51)

## 2018-01-10 MED ORDER — ONDANSETRON 4 MG PO TBDP: 4.0000 mg | ORAL_TABLET | Freq: Three times a day (TID) | ORAL | 0 refills | Status: DC | PRN

## 2018-01-10 MED ORDER — METHOCARBAMOL 500 MG PO TABS: 500.0000 mg | ORAL_TABLET | Freq: Two times a day (BID) | ORAL | 0 refills | Status: DC | PRN

## 2018-01-10 MED ORDER — KETOROLAC TROMETHAMINE 60 MG/2ML IM SOLN
60.0000 mg | Freq: Once | INTRAMUSCULAR | Status: AC
  Administered 2018-01-10: 60 mg via INTRAMUSCULAR
  Filled 2018-01-10: qty 2

## 2018-01-10 MED ORDER — MELOXICAM 7.5 MG PO TABS: 15.0000 mg | ORAL_TABLET | Freq: Every day | ORAL | 0 refills | Status: DC

## 2018-01-10 NOTE — Discharge Instructions (Signed)
Your blood work was normal, no signs of gallbladder disease or pancreatitis, your nausea may be related to some stomach upset or peptic ulcer disease.  Please take Tums as needed, Zofran for nausea, see your doctor if no improvement, eat a low acid diet Your x-rays show no signs of rib fracture, you may benefit from a muscle relaxer called Robaxin and an anti-inflammatory called Mobic.  I have prescribed both of these medications for you If you are still having pain in 1 week please follow-up with your family doctor or return to the emergency department for severe or worsening symptoms

## 2018-01-10 NOTE — ED Provider Notes (Signed)
[facility] EMERGENCY DEPARTMENT Provider Note   CSN: [id number] Arrival date & time: 01/10/18  0609     History   Chief Complaint Chief Complaint  Patient presents with  . Back Pain    HPI Levi Blankenship is a 57 y.o. male.  HPI  Patient is a 57 year old male, he has a history of high blood pressure and some sleep apnea.  He presents today after having some left-sided rib pain that occurred after he woke up with a coughing fit.  This was acute in onset, severe, is persistent especially with any movement deep breathing coughing or sneezing.  It is focused to a very pinpoint area on the left lateral lower ribs.  There is no associated fevers, no associated leg swelling, no chest pain or shortness of breath, no exertional symptoms.  He has had some nausea for the last couple of days, no vomiting but feels upset on the stomach.  He denies abdominal pain.  He does have some intermittent diarrhea.  He reports that he works at a [employer] where there has been some people with similar symptoms recently.  He has had ibuprofen for his left-sided rib pain as well as a muscle relaxer at home but states it did not help.  His symptoms were acute in onset this morning around 1:00 in the morning  Past Medical History:  Diagnosis Date  . Hypertension   . Sleep apnea    not currently using c-pap    There are no active problems to display for this patient.   Past Surgical History:  Procedure Laterality Date  . TONSILLECTOMY    . tumor neck     benign and removed        Home Medications    Prior to Admission medications   Medication Sig Start Date End Date Taking? Authorizing Provider  albuterol (PROVENTIL HFA;VENTOLIN HFA) 108 (90 Base) MCG/ACT inhaler Inhale 1-2 puffs into the lungs every 6 (six) hours as needed for wheezing or shortness of breath. 02/24/17  Yes Volanda Napoleon, PA-C  aspirin EC 81 MG tablet Take 81 mg by mouth daily.   Yes [provider]  felodipine (PLENDIL) 10 MG 24 hr tablet Take 10 mg by mouth daily.   Yes [provider]  lisinopril (PRINIVIL,ZESTRIL) 10 MG tablet Take 10 mg by mouth daily.  03/03/12  Yes [provider]  loratadine (CLARITIN) 10 MG tablet Take 10 mg by mouth daily.   Yes [provider]  naproxen (NAPROSYN) 375 MG tablet Take 1 tablet (375 mg total) by mouth 2 (two) times daily. 10/20/17  Yes Burky, Lanelle Bal B, NP  meloxicam (MOBIC) 7.5 MG tablet Take 2 tablets (15 mg total) by mouth daily. 01/10/18   Noemi Chapel, MD  methocarbamol (ROBAXIN) 500 MG tablet Take 1 tablet (500 mg total) by mouth 2 (two) times daily as needed for muscle spasms. 01/10/18   Noemi Chapel, MD  ondansetron (ZOFRAN ODT) 4 MG disintegrating tablet Take 1 tablet (4 mg total) by mouth every 8 (eight) hours as needed for nausea. 01/10/18   Noemi Chapel, MD    Family History Family History  Problem Relation Age of Onset  . Colon cancer Maternal Grandmother     Social History Social History   Tobacco Use  . Smoking status: Current Every Day Smoker  . Smokeless tobacco: Never Used  Substance Use Topics  . Alcohol use: Yes    Alcohol/week: 1.0 standard drinks  Types: 1 Standard drinks or equivalent per week  . Drug use: No     Allergies   Patient has no known allergies.   Review of Systems Review of Systems  All other systems reviewed and are negative.    Physical Exam Updated Vital Signs BP 136/82   Pulse 74   Temp 99.1 F (37.3 C) (Oral)   Resp 18   SpO2 99%   Physical Exam  Constitutional: He appears well-developed and well-nourished. No distress.  HENT:  Head: Normocephalic and atraumatic.  Mouth/Throat: Oropharynx is clear and moist. No oropharyngeal exudate.  Eyes: Pupils are equal, round, and reactive to light. Conjunctivae and EOM are normal. Right eye exhibits no discharge. Left eye exhibits no discharge. No scleral icterus.  Neck: Normal range of  motion. Neck supple. No JVD present. No thyromegaly present.  Cardiovascular: Normal rate, regular rhythm, normal heart sounds and intact distal pulses. Exam reveals no gallop and no friction rub.  No murmur heard. Pulmonary/Chest: Effort normal and breath sounds normal. No respiratory distress. He has no wheezes. He has no rales. He exhibits tenderness.  Abdominal: Soft. Bowel sounds are normal. He exhibits no distension and no mass. There is no tenderness ( Mild epigastric tenderness, no guarding, no Murphy sign).  Musculoskeletal: Normal range of motion. He exhibits tenderness ( Focal tenderness to palpation over the lateral lower left ribs, there is no crepitance or subcutaneous emphysema, no rash). He exhibits no edema.  Lymphadenopathy:    He has no cervical adenopathy.  Neurological: He is alert. Coordination normal.  Skin: Skin is warm and dry. No rash noted. No erythema.  Psychiatric: He has a normal mood and affect. His behavior is normal.  Nursing note and vitals reviewed.    ED Treatments / Results  Labs (all labs ordered are listed, but only abnormal results are displayed) Labs Reviewed  URINALYSIS, ROUTINE W REFLEX MICROSCOPIC - Abnormal; Notable for the following components:      Result Value   Leukocytes, UA TRACE (*)    All other components within normal limits  BASIC METABOLIC PANEL  CBC  HEPATIC FUNCTION PANEL  LIPASE, BLOOD    EKG None  Radiology Dg Ribs Unilateral W/chest Left  Result Date: 01/10/2018 CLINICAL DATA:  Low back and left chest pain after coughing last night. EXAM: LEFT RIBS AND CHEST - 3+ VIEW COMPARISON:  02/24/2017. Chest CT dated 06/05/2015. FINDINGS: Normal sized heart. Clear lungs. Previously demonstrated right 7th rib bone island. Thoracic spine degenerative changes. No rib fracture or pneumothorax seen. IMPRESSION: No acute abnormality. Electronically Signed   By: Claudie Revering M.D.   On: 01/10/2018 09:50    Procedures Procedures  (including critical care time)  Medications Ordered in ED Medications  ketorolac (TORADOL) injection 60 mg (60 mg Intramuscular Given 01/10/18 0845)     Initial Impression / Assessment and Plan / ED Course  I have reviewed the triage vital signs and the nursing notes.  Pertinent labs & imaging results that were available during my care of the patient were reviewed by me and considered in my medical decision making (see chart for details).  Clinical Course as of Jan 11 1007  Sat Jan 10, 2018  1005 Laboratory work-up is totally normal, the x-ray which I ordered shows no signs of rib fracture. Lipase and hepatic function is normal, will treat with antacids, nausea medicines and an anti-inflammatory for the rib pain.  The patient is in agreement   [BM]    Clinical Course  User Index [BM] Noemi Chapel, MD   The patient has what appears to be either muscle strain or rib injury on the left.  He has tenderness focally at that area, pain with deep breathing however he is able to get around in the room and move around on the bed without significant disability.  Will obtain x-rays of the ribs on the left.  Additionally with the patient's nausea I suspect he may have some underlying illness, with his epigastric tenderness will make sure is not pancreatitis or gallbladder disease though he does not have a Murphy sign.  The patient is agreeable to the plan.  This may just be related to peptic ulcer disease or an underlying virus.  He does not have an acute abdomen  Final Clinical Impressions(s) / ED Diagnoses   Final diagnoses:  Chest wall pain  Nauseated    ED Discharge Orders         Ordered    meloxicam (MOBIC) 7.5 MG tablet  Daily     01/10/18 1007    methocarbamol (ROBAXIN) 500 MG tablet  2 times daily PRN     01/10/18 1007    ondansetron (ZOFRAN ODT) 4 MG disintegrating tablet  Every 8 hours PRN     01/10/18 1007           Noemi Chapel, MD 01/10/18 1008

## 2018-01-10 NOTE — ED Notes (Signed)
Patient verbalized understanding of dc instructions, vss, ambulatory with nad upon discharge.

## 2018-01-10 NOTE — ED Triage Notes (Signed)
C/o L lower back pain since 12:30am.  States he was asleep and coughed and had sudden pain.  Pain worse with movement and coughing.  Denies urinary complaint.

## 2018-01-13 DIAGNOSIS — K219 Gastro-esophageal reflux disease without esophagitis: Secondary | ICD-10-CM | POA: Insufficient documentation

## 2018-01-13 DIAGNOSIS — T7840XA Allergy, unspecified, initial encounter: Secondary | ICD-10-CM | POA: Insufficient documentation

## 2018-05-27 ENCOUNTER — Encounter (HOSPITAL_COMMUNITY): Payer: Self-pay | Admitting: Emergency Medicine

## 2018-05-27 ENCOUNTER — Ambulatory Visit (HOSPITAL_COMMUNITY)
Admission: EM | Admit: 2018-05-27 | Discharge: 2018-05-27 | Disposition: A | Discharge status: Home / Self Care | Payer: BC Managed Care – PPO | Attending: Family Medicine | Admitting: Family Medicine

## 2018-05-27 DIAGNOSIS — J40 Bronchitis, not specified as acute or chronic: Secondary | ICD-10-CM

## 2018-05-27 MED ORDER — PREDNISONE 50 MG PO TABS: ORAL_TABLET | ORAL | 0 refills | Status: DC

## 2018-05-27 MED ORDER — ALBUTEROL SULFATE HFA 108 (90 BASE) MCG/ACT IN AERS: 1.0000 | INHALATION_SPRAY | Freq: Four times a day (QID) | RESPIRATORY_TRACT | 11 refills | Status: DC | PRN

## 2018-05-27 NOTE — ED Triage Notes (Signed)
Pt c/o cough, congestion sob, sinus issues, states its been going on for several weeks, states its hard to catch his breath if he talks in full sentences.

## 2018-05-27 NOTE — ED Provider Notes (Signed)
[facility]    CSN: [id number] Arrival date & time: 05/27/18  0931     History   Chief Complaint Chief Complaint  Patient presents with  . Cough    HPI Levi Blankenship is a 58 y.o. male.   58 year old man with history of bronchitis in the past comes in with cough and congestion.  He has been sick for several weeks.  Patient continues to smoke.  He has been using his inhaler.  Patient denies fever or chest pain.  Patient works in housekeeping at [employer]     Past Medical History:  Diagnosis Date  . Hypertension   . Sleep apnea    not currently using c-pap    There are no active problems to display for this patient.   Past Surgical History:  Procedure Laterality Date  . TONSILLECTOMY    . tumor neck     benign and removed       Home Medications    Prior to Admission medications   Medication Sig Start Date End Date Taking? Authorizing Provider  cetirizine (ZYRTEC) 10 MG tablet Take 10 mg by mouth daily.   Yes [provider]  diphenhydrAMINE (BENADRYL) 25 MG tablet Take 25 mg by mouth every 6 (six) hours as needed.   Yes [provider]  omeprazole (PRILOSEC) 40 MG capsule Take by mouth. 01/13/18  Yes [provider]  albuterol (PROVENTIL HFA;VENTOLIN HFA) 108 (90 Base) MCG/ACT inhaler Inhale 1-2 puffs into the lungs every 6 (six) hours as needed for wheezing or shortness of breath. 05/27/18   Robyn Haber, MD  aspirin EC 81 MG tablet Take 81 mg by mouth daily.    [provider]  felodipine (PLENDIL) 10 MG 24 hr tablet Take 10 mg by mouth daily.    [provider]  lisinopril (PRINIVIL,ZESTRIL) 10 MG tablet Take 10 mg by mouth daily.  03/03/12   [provider]  loratadine (CLARITIN) 10 MG tablet Take 10 mg by mouth daily.    [provider]  predniSONE (DELTASONE) 50 MG tablet One daily with food 05/27/18   Robyn Haber, MD    Family History Family History   Problem Relation Age of Onset  . Colon cancer Maternal Grandmother     Social History Social History   Tobacco Use  . Smoking status: Current Every Day Smoker  . Smokeless tobacco: Never Used  Substance Use Topics  . Alcohol use: Yes    Alcohol/week: 1.0 standard drinks    Types: 1 Standard drinks or equivalent per week  . Drug use: No     Allergies   Patient has no known allergies.   Review of Systems Review of Systems   Physical Exam Triage Vital Signs ED Triage Vitals [05/27/18 1015]  Enc Vitals Group     BP (!) 152/88     Pulse Rate 95     Resp 16     Temp 98.7 F (37.1 C)     Temp Source Oral     SpO2 98 %     Weight      Height      Head Circumference      Peak Flow      Pain Score      Pain Loc      Pain Edu?      Excl. in [place]?    No data found.  Updated Vital Signs BP (!) 152/88 (BP Location: Left Arm)   Pulse 95  Temp 98.7 F (37.1 C) (Oral)   Resp 16   SpO2 98%    Physical Exam Vitals signs and nursing note reviewed.  Constitutional:      General: He is not in acute distress.    Appearance: Normal appearance. He is obese.  HENT:     Head: Normocephalic.     Right Ear: Tympanic membrane normal.     Left Ear: Tympanic membrane normal.     Nose: Nose normal.     Mouth/Throat:     Mouth: Mucous membranes are moist.  Eyes:     Conjunctiva/sclera: Conjunctivae normal.  Neck:     Musculoskeletal: Normal range of motion and neck supple.  Cardiovascular:     Rate and Rhythm: Normal rate.     Pulses: Normal pulses.     Heart sounds: Normal heart sounds.  Pulmonary:     Effort: Pulmonary effort is normal.     Breath sounds: Wheezing present.  Musculoskeletal: Normal range of motion.  Skin:    General: Skin is warm and dry.  Neurological:     General: No focal deficit present.     Mental Status: He is alert.  Psychiatric:        Mood and Affect: Mood normal.      UC Treatments / Results  Labs (all labs ordered are listed,  but only abnormal results are displayed) Labs Reviewed - No data to display  EKG None  Radiology No results found.  Procedures Procedures (including critical care time)  Medications Ordered in UC Medications - No data to display  Initial Impression / Assessment and Plan / UC Course  I have reviewed the triage vital signs and the nursing notes.  Pertinent labs & imaging results that were available during my care of the patient were reviewed by me and considered in my medical decision making (see chart for details).    Final Clinical Impressions(s) / UC Diagnoses   Final diagnoses:  Bronchitis   Discharge Instructions   None    ED Prescriptions    Medication Sig Dispense Auth. Provider   albuterol (PROVENTIL HFA;VENTOLIN HFA) 108 (90 Base) MCG/ACT inhaler  (Status: Discontinued) Inhale 1-2 puffs into the lungs every 6 (six) hours as needed for wheezing or shortness of breath. 1 Inhaler Robyn Haber, MD   predniSONE (DELTASONE) 50 MG tablet One daily with food 5 tablet Robyn Haber, MD   albuterol (PROVENTIL HFA;VENTOLIN HFA) 108 (90 Base) MCG/ACT inhaler Inhale 1-2 puffs into the lungs every 6 (six) hours as needed for wheezing or shortness of breath. [place], MD     Controlled Substance Prescriptions [place] Controlled Substance Registry consulted? Not Applicable   Robyn Haber, MD 05/27/18 1035

## 2018-06-18 ENCOUNTER — Ambulatory Visit: Payer: Self-pay | Admitting: Allergy & Immunology

## 2018-07-02 ENCOUNTER — Ambulatory Visit (INDEPENDENT_AMBULATORY_CARE_PROVIDER_SITE_OTHER): Payer: BC Managed Care – PPO | Admitting: Allergy & Immunology

## 2018-07-02 ENCOUNTER — Encounter: Payer: Self-pay | Admitting: Allergy & Immunology

## 2018-07-02 ENCOUNTER — Other Ambulatory Visit: Payer: Self-pay

## 2018-07-02 VITALS — BP 142/90 | HR 98 | Temp 98.4°F | Resp 16 | Ht 68.0 in | Wt 313.6 lb

## 2018-07-02 DIAGNOSIS — H1010 Acute atopic conjunctivitis, unspecified eye: Secondary | ICD-10-CM | POA: Diagnosis not present

## 2018-07-02 DIAGNOSIS — J310 Chronic rhinitis: Secondary | ICD-10-CM

## 2018-07-02 MED ORDER — TRIAMCINOLONE ACETONIDE 55 MCG/ACT NA AERO: 2.0000 | INHALATION_SPRAY | Freq: Every day | NASAL | 5 refills | Status: DC

## 2018-07-02 MED ORDER — LEVOCETIRIZINE DIHYDROCHLORIDE 5 MG PO TABS: 5.0000 mg | ORAL_TABLET | Freq: Every evening | ORAL | 5 refills | Status: DC

## 2018-07-02 MED ORDER — CETIRIZINE HCL 0.24 % OP SOLN: 2.0000 [drp] | Freq: Two times a day (BID) | OPHTHALMIC | 5 refills | Status: DC

## 2018-07-02 NOTE — Progress Notes (Signed)
NEW PATIENT  Date of Service/Encounter:  07/02/18  Referring provider: Venida Jarvis, MD   Assessment:   Chronic rhinitis  Seasonal allergic conjunctivitis    Mr. Levi Blankenship presents with several years of allergic rhinoconjunctivitis.  Unfortunately, he has been on antihistamine so he cannot perform skin testing.  We will start him on a prednisone burst and do skin testing next Tuesday.  We are going to start him on Nasacort 2 sprays per nostril daily and antihistamine eyedrops.  Samples of both were provided.  We will plan to start him on Xyzal after his testing on Tuesday.  We are also going discussed the initiation of allergen immunotherapy.  Plan/Recommendations:    1. Chronic rhinitis with allergic conjunctivitis - We will do testing next Tuesday April 21st. - Start the prednisone pack today to get you through the weekend without the antihistamines (cetirizine, loratadine, fexofenadine, etc) - Start taking: Nasacort (triamcinolone) two sprays per nostril daily and Zerviate eye drops one drop per eye twice daily as needed (copay card for the Zervitate eye drops provided) - AFTER Tuesday, start Xyzal (levocetirizine) 5mg  daily as needed - You can use an extra dose of the antihistamine, if needed, for breakthrough symptoms.  - Consider nasal saline rinses 1-2 times daily to remove allergens from the nasal cavities as well as help with mucous clearance (this is especially helpful to do before the nasal sprays are given) - Consider allergy shots as a means of long-term control. - Allergy shots "re-train" and "reset" the immune system to ignore environmental allergens and decrease the resulting immune response to those allergens (sneezing, itchy watery eyes, runny nose, nasal congestion, etc).    - Allergy shots improve symptoms in 75-85% of patients.  - We can discuss more once we do the testing next week.  2. Return in about 5 days (around 07/07/2018) for ALLERGY TESTING.   Subjective:   Levi Blankenship is a 58 y.o. male presenting today for evaluation of  Chief Complaint  Patient presents with  . Allergy Testing    watery eyes, constantly tearing  . Shortness of Breath    uses albuterol inhaler and feels better    Levi Blankenship has a history of the following: There are no active problems to display for this patient.   History obtained from: chart review and patient.  Levi Blankenship was referred by Venida Jarvis, MD.     Levi Blankenship is a 58 y.o. male presenting for an evaluation of itchy watery eyes.   Allergic Rhinitis Symptom History: He reports that he has had itchy watery eyes throughout the year for approximately 4 to 5 years.  He has used some eyedrops with improvement in his symptoms.  Unfortunately, he does not remember the name of these eyedrops.  He also has a history of rhinorrhea.  He is on a nose spray, likely a nasal steroid but again he does not remember the name.  He has only ever been on one no spray.  He does use Claritin and cetirizine.  He took cetirizine this morning.  He feels like he needs to change his antihistamines regularly to keep on top of his symptoms.  He does endorse some sneezing as well.  Springtime is definitely the worse, but he does have symptoms throughout the entire year.  He gets antibiotics for sinus infections around 1-2 times per year.  He has never been allergy tested in the past.  He does report some shortness of breath recently, but this  seems to be more related to postnasal drip.  He has never needed an inhaler.  It should be noted that he is a current smoker.  Otherwise, there is no history of other atopic diseases, including asthma, food allergies, drug allergies, stinging insect allergies, eczema, urticaria or contact dermatitis. There is no significant infectious history. Vaccinations are up to date.    Past Medical History: There are no active problems to display for this patient.   Medication List:   Allergies as of 07/02/2018   No Known Allergies     Medication List       Accurate as of July 02, 2018  2:48 PM. Always use your most recent med list.        albuterol 108 (90 Base) MCG/ACT inhaler Commonly known as:  VENTOLIN HFA Inhale 1-2 puffs into the lungs every 6 (six) hours as needed for wheezing or shortness of breath.   aspirin EC 81 MG tablet Take 81 mg by mouth daily.   cetirizine 10 MG tablet Commonly known as:  ZYRTEC Take 10 mg by mouth daily.   diphenhydrAMINE 25 MG tablet Commonly known as:  BENADRYL Take 25 mg by mouth every 6 (six) hours as needed.   felodipine 10 MG 24 hr tablet Commonly known as:  PLENDIL Take 10 mg by mouth daily.   lisinopril 10 MG tablet Commonly known as:  ZESTRIL Take 10 mg by mouth daily.   loratadine 10 MG tablet Commonly known as:  CLARITIN Take 10 mg by mouth daily.   omeprazole 40 MG capsule Commonly known as:  PRILOSEC Take by mouth.       Birth History: non-contributory  Developmental History: non-contributory  Past Surgical History: Past Surgical History:  Procedure Laterality Date  . TONSILLECTOMY    . tumor neck     benign and removed     Family History: Family History  Problem Relation Age of Onset  . Colon cancer Maternal Grandmother      Social History: Madden lives at home with his family.  They live in a house that is 59 years old.  There is carpeting throughout the home.  They have electric heating and central cooling.  There are parakeets in the home, otherwise no animals.  He does have dust mite covers on his bed, but not his pillows.  There is tobacco exposure in the house in the car.  He has smoked since he was 58 years of age.  He estimates that he smokes around 1-1/2 packs/week.  He currently works as a Sports coach at [employer]. He is not working currently due to the coronavirus pandemic.    Review of Systems  Constitutional: Negative.  Negative for chills, fever,  malaise/fatigue and weight loss.  HENT: Positive for sore throat. Negative for congestion, ear discharge and ear pain.        Positive for postnasal drip.  Eyes: Negative for pain, discharge and redness.  Respiratory: Positive for hemoptysis. Negative for cough, sputum production, shortness of breath and wheezing.   Cardiovascular: Negative.  Negative for chest pain and palpitations.  Gastrointestinal: Negative for abdominal pain, heartburn, nausea and vomiting.  Skin: Negative.  Negative for itching and rash.  Neurological: Negative for dizziness and headaches.  Endo/Heme/Allergies: Positive for environmental allergies. Does not bruise/bleed easily.       Objective:   Blood pressure (!) 142/90, pulse 98, temperature 98.4 F (36.9 C), temperature source Oral, resp. rate 16, height 5\' 8"  (1.727 m), weight Marland Kitchen)  313 lb 9.6 oz (142.2 kg), SpO2 98 %. Body mass index is 47.68 kg/m.   Physical Exam:   Physical Exam  Constitutional: He appears well-developed.  Boisterous male.  Talkative.  HENT:  Head: Normocephalic and atraumatic.  Right Ear: Tympanic membrane, external ear and ear canal normal. No drainage, swelling or tenderness. Tympanic membrane is not injected, not scarred, not erythematous, not retracted and not bulging.  Left Ear: Tympanic membrane, external ear and ear canal normal. No drainage, swelling or tenderness. Tympanic membrane is not injected, not scarred, not erythematous, not retracted and not bulging.  Nose: Mucosal edema and rhinorrhea present. No sinus tenderness, nasal deformity or septal deviation. No epistaxis. Right sinus exhibits no maxillary sinus tenderness and no frontal sinus tenderness. Left sinus exhibits no maxillary sinus tenderness and no frontal sinus tenderness.  Mouth/Throat: Uvula is midline and oropharynx is clear and moist. Mucous membranes are not pale and not dry.  Tonsils 3+ bilaterally without exudates.  There is cobblestoning present in the  posterior oropharynx.  Rhinorrhea is clear.  Eyes: Pupils are equal, round, and reactive to light. EOM are normal. Right eye exhibits no chemosis and no discharge. Left eye exhibits no chemosis and no discharge. Right conjunctiva is injected. Left conjunctiva is injected.  Allergic shiners present bilaterally.  Cardiovascular: Normal rate, regular rhythm and normal heart sounds.  Respiratory: Effort normal and breath sounds normal. No accessory muscle usage. No tachypnea. No respiratory distress. He has no wheezes. He has no rhonchi. He has no rales. He exhibits no tenderness.  GI: There is no abdominal tenderness. There is no rebound and no guarding.  Lymphadenopathy:       Head (right side): No submandibular, no tonsillar and no occipital adenopathy present.       Head (left side): No submandibular, no tonsillar and no occipital adenopathy present.    He has no cervical adenopathy.  Neurological: He is alert.  Skin: No abrasion, no petechiae and no rash noted. Rash is not papular, not vesicular and not urticarial. No erythema. No pallor.  Psychiatric: He has a normal mood and affect.     Diagnostic studies: deferred due to recent antihistamine use        Salvatore Marvel, MD [facility]

## 2018-07-02 NOTE — Addendum Note (Signed)
Addended by: Neomia Dear on: 07/02/2018 03:47 PM   Modules accepted: Orders

## 2018-07-02 NOTE — Patient Instructions (Addendum)
1. Chronic rhinitis with allergic conjunctivitis - We will do testing next Tuesday April 21st. - Start the prednisone pack today to get you through the weekend without the antihistamines (cetirizine, loratadine, fexofenadine, etc) - Start taking: Nasacort (triamcinolone) two sprays per nostril daily and Zerviate eye drops one drop per eye twice daily as needed (copay card for the Zervitate eye drops provided) - AFTER Tuesday, start Xyzal (levocetirizine) 5mg  daily as needed - You can use an extra dose of the antihistamine, if needed, for breakthrough symptoms.  - Consider nasal saline rinses 1-2 times daily to remove allergens from the nasal cavities as well as help with mucous clearance (this is especially helpful to do before the nasal sprays are given) - Consider allergy shots as a means of long-term control. - Allergy shots "re-train" and "reset" the immune system to ignore environmental allergens and decrease the resulting immune response to those allergens (sneezing, itchy watery eyes, runny nose, nasal congestion, etc).    - Allergy shots improve symptoms in 75-85% of patients.  - We can discuss more once we do the testing next week.  2. Return in about 5 days (around 07/07/2018) for ALLERGY TESTING.   Please inform Levi Blankenship of any Emergency Department visits, hospitalizations, or changes in symptoms. Call Levi Blankenship before going to the ED for breathing or allergy symptoms since we might be able to fit you in for a sick visit. Feel free to contact Levi Blankenship anytime with any questions, problems, or concerns.  It was a pleasure to meet you today!  Websites that have reliable patient information: 1. American Academy of Asthma, Allergy, and Immunology: www.aaaai.org 2. Food Allergy Research and Education (FARE): foodallergy.org 3. Mothers of Asthmatics: http://www.asthmacommunitynetwork.org 4. American College of Allergy, Asthma, and Immunology: www.acaai.org  Like Levi Blankenship on [place] and Instagram for our latest  updates!      Make sure you are registered to vote! If you have moved or changed any of your contact information, you will need to get this updated before voting!    Voter ID laws are NOT going into effect for the General Election in November 2020! DO NOT let this stop you from exercising your right to vote!    Allergy Shots   Allergies are the result of a chain reaction that starts in the immune system. Your immune system controls how your body defends itself. For instance, if you have an allergy to pollen, your immune system identifies pollen as an invader or allergen. Your immune system overreacts by producing antibodies called Immunoglobulin E (IgE). These antibodies travel to cells that release chemicals, causing an allergic reaction.  The concept behind allergy immunotherapy, whether it is received in the form of shots or tablets, is that the immune system can be desensitized to specific allergens that trigger allergy symptoms. Although it requires time and patience, the payback can be long-term relief.  How Do Allergy Shots Work?  Allergy shots work much like a vaccine. Your body responds to injected amounts of a particular allergen given in increasing doses, eventually developing a resistance and tolerance to it. Allergy shots can lead to decreased, minimal or no allergy symptoms.  There generally are two phases: build-up and maintenance. Build-up often ranges from three to six months and involves receiving injections with increasing amounts of the allergens. The shots are typically given once or twice a week, though more rapid build-up schedules are sometimes used.  The maintenance phase begins when the most effective dose is reached. This dose is different for  each person, depending on how allergic you are and your response to the build-up injections. Once the maintenance dose is reached, there are longer periods between injections, typically two to four weeks.  Occasionally  doctors give cortisone-type shots that can temporarily reduce allergy symptoms. These types of shots are different and should not be confused with allergy immunotherapy shots.  Who Can Be Treated with Allergy Shots?  Allergy shots may be a good treatment approach for people with allergic rhinitis (hay fever), allergic asthma, conjunctivitis (eye allergy) or stinging insect allergy.   Before deciding to begin allergy shots, you should consider:   The length of allergy season and the severity of your symptoms  Whether medications and/or changes to your environment can control your symptoms  Your desire to avoid long-term medication use  Time: allergy immunotherapy requires a major time commitment  Cost: may vary depending on your insurance coverage  Allergy shots for children age 53 and older are effective and often well tolerated. They might prevent the onset of new allergen sensitivities or the progression to asthma.  Allergy shots are not started on patients who are pregnant but can be continued on patients who become pregnant while receiving them. In some patients with other medical conditions or who take certain common medications, allergy shots may be of risk. It is important to mention other medications you talk to your allergist.   When Will I Feel Better?  Some may experience decreased allergy symptoms during the build-up phase. For others, it may take as long as 12 months on the maintenance dose. If there is no improvement after a year of maintenance, your allergist will discuss other treatment options with you.  If you arent responding to allergy shots, it may be because there is not enough dose of the allergen in your vaccine or there are missing allergens that were not identified during your allergy testing. Other reasons could be that there are high levels of the allergen in your environment or major exposure to non-allergic triggers like tobacco smoke.  What Is the Length  of Treatment?  Once the maintenance dose is reached, allergy shots are generally continued for three to five years. The decision to stop should be discussed with your allergist at that time. Some people may experience a permanent reduction of allergy symptoms. Others may relapse and a longer course of allergy shots can be considered.  What Are the Possible Reactions?  The two types of adverse reactions that can occur with allergy shots are local and systemic. Common local reactions include very mild redness and swelling at the injection site, which can happen immediately or several hours after. A systemic reaction, which is less common, affects the entire body or a particular body system. They are usually mild and typically respond quickly to medications. Signs include increased allergy symptoms such as sneezing, a stuffy nose or hives.  Rarely, a serious systemic reaction called anaphylaxis can develop. Symptoms include swelling in the throat, wheezing, a feeling of tightness in the chest, nausea or dizziness. Most serious systemic reactions develop within 30 minutes of allergy shots. This is why it is strongly recommended you wait in your doctors office for 30 minutes after your injections. Your allergist is trained to watch for reactions, and his or her staff is trained and equipped with the proper medications to identify and treat them.  Who Should Administer Allergy Shots?  The preferred location for receiving shots is your prescribing allergists office. Injections can sometimes be given at  another facility where the physician and staff are trained to recognize and treat reactions, and have received instructions by your prescribing allergist.

## 2018-07-07 ENCOUNTER — Encounter: Payer: Self-pay | Admitting: Allergy & Immunology

## 2018-07-07 ENCOUNTER — Other Ambulatory Visit: Payer: Self-pay

## 2018-07-07 ENCOUNTER — Telehealth: Payer: Self-pay

## 2018-07-07 ENCOUNTER — Ambulatory Visit (INDEPENDENT_AMBULATORY_CARE_PROVIDER_SITE_OTHER): Payer: BC Managed Care – PPO | Admitting: Allergy & Immunology

## 2018-07-07 VITALS — BP 162/96 | HR 72

## 2018-07-07 DIAGNOSIS — J3089 Other allergic rhinitis: Secondary | ICD-10-CM

## 2018-07-07 DIAGNOSIS — J302 Other seasonal allergic rhinitis: Secondary | ICD-10-CM | POA: Diagnosis not present

## 2018-07-07 NOTE — Patient Instructions (Addendum)
1. Chronic rhinitis - Testing today showed: grasses, ragweed, weeds, trees, indoor molds, outdoor molds, dust mites, cat and cockroach - Copy of test results provided.  - Avoidance measures provided. - Continue taking: Nasacort (triamcinolone) two sprays per nostril daily and Zerviate eye drops one drop per eye twice daily as needed (copay card for the Zervitate eye drops provided) - Start taking: Xyzal (levocetirizine) 5mg  daily as needed - You can use an extra dose of the antihistamine, if needed, for breakthrough symptoms.  - Consider nasal saline rinses 1-2 times daily to remove allergens from the nasal cavities as well as help with mucous clearance (this is especially helpful to do before the nasal sprays are given) - Consider allergy shots as a means of long-term control. - Allergy shots "re-train" and "reset" the immune system to ignore environmental allergens and decrease the resulting immune response to those allergens (sneezing, itchy watery eyes, runny nose, nasal congestion, etc).    - Allergy shots improve symptoms in 75-85% of patients.  - Check with your insurance company if you are interested and we can start allergy shots.   2. Return in about 6 months (around 01/06/2019). This can be an in-person, a virtual Webex or a telephone follow up visit.   Please inform us of any Emergency Department visits, hospitalizations, or changes in symptoms. Call us before going to the ED for breathing or allergy symptoms since we might be able to fit you in for a sick visit. Feel free to contact us anytime with any questions, problems, or concerns.  It was a pleasure to see you again today!  Websites that have reliable patient information: 1. American Academy of Asthma, Allergy, and Immunology: www.aaaai.org 2. Food Allergy Research and Education (FARE): foodallergy.org 3. Mothers of Asthmatics: http://www.asthmacommunitynetwork.org 4. American College of Allergy, Asthma, and Immunology:  www.acaai.org  Like [place] on [place] and Instagram for our latest updates!      Make sure you are registered to vote! If you have moved or changed any of your contact information, you will need to get this updated before voting!    Voter ID laws are NOT going into effect for the General Election in November 2020! DO NOT let this stop you from exercising your right to vote!    Reducing Pollen Exposure  The American Academy of Allergy, Asthma and Immunology suggests the following steps to reduce your exposure to pollen during allergy seasons.    1. Do not hang sheets or clothing out to dry; pollen may collect on these items. 2. Do not mow lawns or spend time around freshly cut grass; mowing stirs up pollen. 3. Keep windows closed at night.  Keep car windows closed while driving. 4. Minimize morning activities outdoors, a time when pollen counts are usually at their highest. 5. Stay indoors as much as possible when pollen counts or humidity is high and on windy days when pollen tends to remain in the air longer. 6. Use air conditioning when possible.  Many air conditioners have filters that trap the pollen spores. 7. Use a HEPA room air filter to remove pollen form the indoor air you breathe.  Control of Mold Allergen   Mold and fungi can grow on a variety of surfaces provided certain temperature and moisture conditions exist.  Outdoor molds grow on plants, decaying vegetation and soil.  The major outdoor mold, Alternaria and Cladosporium, are found in very high numbers during hot and dry conditions.  Generally, a late Summer - Fall peak  is seen for common outdoor fungal spores.  Rain will temporarily lower outdoor mold spore count, but counts rise rapidly when the rainy period ends.  The most important indoor molds are Aspergillus and Penicillium.  Dark, humid and poorly ventilated basements are ideal sites for mold growth.  The next most common sites of mold growth are the bathroom and the  kitchen.  Outdoor (Seasonal) Mold Control  Positive outdoor molds via skin testing: Alternaria, Cladosporium, Bipolaris (Helminthsporium), Drechslera (Curvalaria) and Mucor  1. Use air conditioning and keep windows closed 2. Avoid exposure to decaying vegetation. 3. Avoid leaf raking. 4. Avoid grain handling. 5. Consider wearing a face mask if working in moldy areas.  6.   Indoor (Perennial) Mold Control   Positive indoor molds via skin testing: Fusarium, Aureobasidium (Pullulara) and Rhizopus  1. Maintain humidity below 50%. 2. Clean washable surfaces with 5% bleach solution. 3. Remove sources e.g. contaminated carpets.     Control of Dog or Cat Allergen  Avoidance is the best way to manage a dog or cat allergy. If you have a dog or cat and are allergic to dog or cats, consider removing the dog or cat from the home. If you have a dog or cat but dont want to find it a new home, or if your family wants a pet even though someone in the household is allergic, here are some strategies that may help keep symptoms at bay:  1. Keep the pet out of your bedroom and restrict it to only a few rooms. Be advised that keeping the dog or cat in only one room will not limit the allergens to that room. 2. Dont pet, hug or kiss the dog or cat; if you do, wash your hands with soap and water. 3. High-efficiency particulate air (HEPA) cleaners run continuously in a bedroom or living room can reduce allergen levels over time. 4. Regular use of a high-efficiency vacuum cleaner or a central vacuum can reduce allergen levels. 5. Giving your dog or cat a bath at least once a week can reduce airborne allergen.  Control of House Dust Mite Allergen    House dust mites play a major role in allergic asthma and rhinitis.  They occur in environments with high humidity wherever human skin, the food for dust mites is found. High levels have been detected in dust obtained from mattresses, pillows, carpets,  upholstered furniture, bed covers, clothes and soft toys.  The principal allergen of the house dust mite is found in its feces.  A gram of dust may contain 1,000 mites and 250,000 fecal particles.  Mite antigen is easily measured in the air during house cleaning activities.    1. Encase mattresses, including the box spring, and pillow, in an air tight cover.  Seal the zipper end of the encased mattresses with wide adhesive tape. 2. Wash the bedding in water of 130 degrees Farenheit weekly.  Avoid cotton comforters/quilts and flannel bedding: the most ideal bed covering is the dacron comforter. 3. Remove all upholstered furniture from the bedroom. 4. Remove carpets, carpet padding, rugs, and non-washable window drapes from the bedroom.  Wash drapes weekly or use plastic window coverings. 5. Remove all non-washable stuffed toys from the bedroom.  Wash stuffed toys weekly. 6. Have the room cleaned frequently with a vacuum cleaner and a damp dust-mop.  The patient should not be in a room which is being cleaned and should wait 1 hour after cleaning before going into the room. 7. Close  and seal all heating outlets in the bedroom.  Otherwise, the room will become filled with dust-laden air.  An electric heater can be used to heat the room. 8. Reduce indoor humidity to less than 50%.  Do not use a humidifier. Control of Cockroach Allergen  Cockroach allergen has been identified as an important cause of acute attacks of asthma, especially in urban settings.  There are fifty-five species of cockroach that exist in the [place], however only three, the [place], [place] species produce allergen that can affect patients with Asthma.  Allergens can be obtained from fecal particles, egg casings and secretions from cockroaches.    1. Remove food sources. 2. Reduce access to water. 3. Seal access and entry points. 4. Spray runways with 0.5-1% Diazinon or Chlorpyrifos 5. Blow boric acid power under  stoves and refrigerator. 6. Place bait stations (hydramethylnon) at feeding sites.  Allergy Shots   Allergies are the result of a chain reaction that starts in the immune system. Your immune system controls how your body defends itself. For instance, if you have an allergy to pollen, your immune system identifies pollen as an invader or allergen. Your immune system overreacts by producing antibodies called Immunoglobulin E (IgE). These antibodies travel to cells that release chemicals, causing an allergic reaction.  The concept behind allergy immunotherapy, whether it is received in the form of shots or tablets, is that the immune system can be desensitized to specific allergens that trigger allergy symptoms. Although it requires time and patience, the payback can be long-term relief.  How Do Allergy Shots Work?  Allergy shots work much like a vaccine. Your body responds to injected amounts of a particular allergen given in increasing doses, eventually developing a resistance and tolerance to it. Allergy shots can lead to decreased, minimal or no allergy symptoms.  There generally are two phases: build-up and maintenance. Build-up often ranges from three to six months and involves receiving injections with increasing amounts of the allergens. The shots are typically given once or twice a week, though more rapid build-up schedules are sometimes used.  The maintenance phase begins when the most effective dose is reached. This dose is different for each person, depending on how allergic you are and your response to the build-up injections. Once the maintenance dose is reached, there are longer periods between injections, typically two to four weeks.  Occasionally doctors give cortisone-type shots that can temporarily reduce allergy symptoms. These types of shots are different and should not be confused with allergy immunotherapy shots.  Who Can Be Treated with Allergy Shots?  Allergy shots may be a  good treatment approach for people with allergic rhinitis (hay fever), allergic asthma, conjunctivitis (eye allergy) or stinging insect allergy.   Before deciding to begin allergy shots, you should consider:   The length of allergy season and the severity of your symptoms  Whether medications and/or changes to your environment can control your symptoms  Your desire to avoid long-term medication use  Time: allergy immunotherapy requires a major time commitment  Cost: may vary depending on your insurance coverage  Allergy shots for children age 34 and older are effective and often well tolerated. They might prevent the onset of new allergen sensitivities or the progression to asthma.  Allergy shots are not started on patients who are pregnant but can be continued on patients who become pregnant while receiving them. In some patients with other medical conditions or who take certain common medications, allergy shots may  be of risk. It is important to mention other medications you talk to your allergist.   When Will I Feel Better?  Some may experience decreased allergy symptoms during the build-up phase. For others, it may take as long as 12 months on the maintenance dose. If there is no improvement after a year of maintenance, your allergist will discuss other treatment options with you.  If you arent responding to allergy shots, it may be because there is not enough dose of the allergen in your vaccine or there are missing allergens that were not identified during your allergy testing. Other reasons could be that there are high levels of the allergen in your environment or major exposure to non-allergic triggers like tobacco smoke.  What Is the Length of Treatment?  Once the maintenance dose is reached, allergy shots are generally continued for three to five years. The decision to stop should be discussed with your allergist at that time. Some people may experience a permanent reduction  of allergy symptoms. Others may relapse and a longer course of allergy shots can be considered.  What Are the Possible Reactions?  The two types of adverse reactions that can occur with allergy shots are local and systemic. Common local reactions include very mild redness and swelling at the injection site, which can happen immediately or several hours after. A systemic reaction, which is less common, affects the entire body or a particular body system. They are usually mild and typically respond quickly to medications. Signs include increased allergy symptoms such as sneezing, a stuffy nose or hives.  Rarely, a serious systemic reaction called anaphylaxis can develop. Symptoms include swelling in the throat, wheezing, a feeling of tightness in the chest, nausea or dizziness. Most serious systemic reactions develop within 30 minutes of allergy shots. This is why it is strongly recommended you wait in your doctors office for 30 minutes after your injections. Your allergist is trained to watch for reactions, and his or her staff is trained and equipped with the proper medications to identify and treat them.  Who Should Administer Allergy Shots?  The preferred location for receiving shots is your prescribing allergists office. Injections can sometimes be given at another facility where the physician and staff are trained to recognize and treat reactions, and have received instructions by your prescribing allergist.

## 2018-07-07 NOTE — Progress Notes (Signed)
FOLLOW UP  Date of Service/Encounter:  07/07/18   Assessment:   Seasonal and perennial allergic rhinitis (grasses, ragweed, weeds, trees, indoor molds, outdoor molds, dust mites, cat and cockroach) - wishing to initiate allergen immunotherapy  Plan/Recommendations:   1. Chronic rhinitis - Testing today showed: grasses, ragweed, weeds, trees, indoor molds, outdoor molds, dust mites, cat and cockroach - Copy of test results provided.  - Avoidance measures provided. - Continue taking: Nasacort (triamcinolone) two sprays per nostril daily and Zerviate eye drops one drop per eye twice daily as needed (copay card for the Zervitate eye drops provided) - Start taking: Xyzal (levocetirizine) 5mg  daily as needed - You can use an extra dose of the antihistamine, if needed, for breakthrough symptoms.  - Consider nasal saline rinses 1-2 times daily to remove allergens from the nasal cavities as well as help with mucous clearance (this is especially helpful to do before the nasal sprays are given) - Consider allergy shots as a means of long-term control. - Allergy shots "re-train" and "reset" the immune system to ignore environmental allergens and decrease the resulting immune response to those allergens (sneezing, itchy watery eyes, runny nose, nasal congestion, etc).    - Allergy shots improve symptoms in 75-85% of patients.  - Check with your insurance company if you are interested and we can start allergy shots.   2. Return in about 6 months (around 01/06/2019). This can be an in-person, a virtual Webex or a telephone follow up visit.   Subjective:   Levi Blankenship is a 58 y.o. male presenting today for follow up of  Chief Complaint  Patient presents with  . Allergy Testing    Levi Blankenship has a history of the following: Patient Active Problem List   Diagnosis Date Noted  . Seasonal and perennial allergic rhinitis 07/08/2018    History obtained from: chart review and patient.   Levi Blankenship is a 58 y.o. male presenting for skin testing.  He was last seen last week.  At that time, he was on anti-histamine so he did not do testing.  We started been on a prednisone pack so that he could stay off his antihistamines.  We started him on Nasacort 2 sprays per nostril daily and cetirizine eyedrops.  We also had plans to start him on Xyzal 5 mg daily.  Otherwise, there have been no changes to his past medical history, surgical history, family history, or social history.    Review of Systems  Constitutional: Negative.  Negative for fever, malaise/fatigue and weight loss.  HENT: Positive for congestion and sinus pain. Negative for ear discharge and ear pain.        Positive for postnasal drip.  Eyes: Positive for discharge and redness. Negative for pain.  Respiratory: Negative for cough, sputum production, shortness of breath and wheezing.   Cardiovascular: Negative.  Negative for chest pain and palpitations.  Gastrointestinal: Negative for abdominal pain and heartburn.  Skin: Negative.  Negative for itching and rash.  Neurological: Negative for dizziness and headaches.  Endo/Heme/Allergies: Positive for environmental allergies. Does not bruise/bleed easily.       Objective:   Blood pressure (!) 162/96, pulse 72, SpO2 97 %. There is no height or weight on file to calculate BMI.   Physical Exam: deferred since this was a skin testing appointment only  Diagnostic studies:      Allergy Studies:    Airborne Adult Perc - 07/07/18 1431    Time Antigen Placed  1431  Allergen Manufacturer  Greer    Location  Back    Number of Test  59    Panel 1  Select    1. Control-Buffer 50% Glycerol  Negative    2. Control-Histamine 1 mg/ml  2+    3. Albumin saline  Negative    4. [place]  Negative    5. [place]  Negative    6. Johnson  Negative    7. Santee Blue  Negative    8. Meadow Fescue  Negative    9. Perennial Rye  Negative    10. Sweet [name]  Negative    11.  Timothy  Negative    12. Cocklebur  Negative    13. Burweed Marshelder  Negative    14. Ragweed, short  Negative    15. Ragweed, Giant  Negative    16. Plantain,  English  Negative    17. Lamb's Quarters  Negative    18. Sheep Sorrell  Negative    19. Rough Pigweed  Negative    20. Marsh Elder, Rough  Negative    21. Mugwort, Common  Negative    22. Ash mix  Negative    23. Birch mix  Negative    24. Beech American  Negative    25. Box, Elder  Negative    26. Cedar, red  Negative    27. Cottonwood, [place]  Negative    28. Elm mix  Negative    29. Hickory mix  Negative    30. Maple mix  Negative    31. Oak, [place] mix  Negative    32. Pecan Pollen  Negative    33. Pine mix  Negative    34. Sycamore Eastern  Negative    35. [place], Black Pollen  Negative    36. Alternaria alternata  Negative    37. Cladosporium Herbarum  Negative    38. Aspergillus mix  Negative    39. Penicillium mix  Negative    40. Bipolaris sorokiniana (Helminthosporium)  Negative    41. Drechslera spicifera (Curvularia)  Negative    42. Mucor plumbeus  Negative    43. Fusarium moniliforme  Negative    44. Aureobasidium pullulans (pullulara)  Negative    45. Rhizopus oryzae  Negative    46. Botrytis cinera  Negative    47. Epicoccum nigrum  Negative    48. Phoma betae  Negative    50. Trichophyton mentagrophytes  Negative    51. Mite, D Farinae  5,000 AU/ml  --   +/-   52. Mite, D Pteronyssinus  5,000 AU/ml  --   +/-   53. Cat Hair 10,000 BAU/ml  Negative    54.  Dog Epithelia  Negative    55. Mixed Feathers  Negative    56. Horse Epithelia  Negative    57. Cockroach, German  Negative    58. Mouse  Negative    59. Tobacco Leaf  Negative     Intradermal - 07/07/18 1508    Time Antigen Placed  1508    Allergen Manufacturer  Lavella Hammock    Location  Back    Number of Test  15    Intradermal  Select    Control  Negative    [place]  2+    Johnson  1+    7 Grass  Negative    Ragweed mix  3+     Weed mix  2+    Tree mix  2+  Mold 1  1+    Mold 2  Negative    Mold 3  1+    Mold 4  1+    Cat  1+    Dog  Negative    Cockroach  2+    Mite mix  3+     Food Adult Perc - 07/07/18 1400    Time Antigen Placed  --   omit   Allergen Manufacturer  --   omit   Location  --   omit   Number of allergen test  --   omit   Panel 2  --   omit   Comments  OMIT       Allergy testing results were read and interpreted by myself, documented by clinical staff.      Levi Marvel, MD  [facility]

## 2018-07-07 NOTE — Telephone Encounter (Signed)
PA for Zervite 0.24% was denied

## 2018-07-07 NOTE — Telephone Encounter (Signed)
PA initiated for Zerviate 0.24 % is pending

## 2018-07-08 ENCOUNTER — Encounter: Payer: Self-pay | Admitting: Allergy & Immunology

## 2018-07-08 DIAGNOSIS — J3089 Other allergic rhinitis: Principal | ICD-10-CM

## 2018-07-08 DIAGNOSIS — J302 Other seasonal allergic rhinitis: Secondary | ICD-10-CM | POA: Insufficient documentation

## 2018-07-08 DIAGNOSIS — J301 Allergic rhinitis due to pollen: Secondary | ICD-10-CM

## 2018-07-08 NOTE — Progress Notes (Signed)
VIALS EXP 07-08-2019

## 2018-07-09 DIAGNOSIS — J3089 Other allergic rhinitis: Secondary | ICD-10-CM

## 2018-07-21 ENCOUNTER — Other Ambulatory Visit: Payer: Self-pay

## 2018-07-21 ENCOUNTER — Ambulatory Visit (INDEPENDENT_AMBULATORY_CARE_PROVIDER_SITE_OTHER): Payer: BC Managed Care – PPO | Admitting: *Deleted

## 2018-07-21 DIAGNOSIS — J309 Allergic rhinitis, unspecified: Secondary | ICD-10-CM | POA: Diagnosis not present

## 2018-07-21 MED ORDER — EPINEPHRINE 0.3 MG/0.3ML IJ SOAJ: 0.3000 mg | Freq: Once | INTRAMUSCULAR | 2 refills | Status: AC

## 2018-07-21 NOTE — Progress Notes (Signed)
f °

## 2018-07-21 NOTE — Progress Notes (Signed)
Immunotherapy   Patient Details  Name: Levi Blankenship. Boeke MRN: [id number] Date of Birth: 10-07-1960  07/21/2018  Arnell Sieving L. Banes started injections for  MOLDS-CR-DM, G-W-T-C Following schedule: B  Frequency: 1-2X WEEKLY Epi-Pen: Yes Consent signed and patient instructions given.  Jaydenn received .68mL of G-W-T-C in LUA and .33mL of MOLDS-CR-DM in RUA. Patient waited 30 minutes and did not experience any issues.    Abran Gavigan Fernandez-Vernon 07/21/2018, 9:05 AM

## 2018-07-28 ENCOUNTER — Ambulatory Visit (INDEPENDENT_AMBULATORY_CARE_PROVIDER_SITE_OTHER): Payer: BC Managed Care – PPO

## 2018-07-28 DIAGNOSIS — J309 Allergic rhinitis, unspecified: Secondary | ICD-10-CM | POA: Diagnosis not present

## 2018-08-03 ENCOUNTER — Ambulatory Visit (INDEPENDENT_AMBULATORY_CARE_PROVIDER_SITE_OTHER): Payer: BC Managed Care – PPO

## 2018-08-03 DIAGNOSIS — J309 Allergic rhinitis, unspecified: Secondary | ICD-10-CM | POA: Diagnosis not present

## 2018-08-11 ENCOUNTER — Ambulatory Visit (INDEPENDENT_AMBULATORY_CARE_PROVIDER_SITE_OTHER): Payer: BC Managed Care – PPO | Admitting: *Deleted

## 2018-08-11 DIAGNOSIS — J309 Allergic rhinitis, unspecified: Secondary | ICD-10-CM

## 2018-08-17 ENCOUNTER — Ambulatory Visit (INDEPENDENT_AMBULATORY_CARE_PROVIDER_SITE_OTHER): Payer: BC Managed Care – PPO

## 2018-08-17 DIAGNOSIS — J309 Allergic rhinitis, unspecified: Secondary | ICD-10-CM

## 2018-08-25 ENCOUNTER — Ambulatory Visit (INDEPENDENT_AMBULATORY_CARE_PROVIDER_SITE_OTHER): Payer: BC Managed Care – PPO | Admitting: *Deleted

## 2018-08-25 DIAGNOSIS — J309 Allergic rhinitis, unspecified: Secondary | ICD-10-CM | POA: Diagnosis not present

## 2018-08-31 ENCOUNTER — Ambulatory Visit (INDEPENDENT_AMBULATORY_CARE_PROVIDER_SITE_OTHER): Payer: BC Managed Care – PPO | Admitting: *Deleted

## 2018-08-31 DIAGNOSIS — J309 Allergic rhinitis, unspecified: Secondary | ICD-10-CM | POA: Diagnosis not present

## 2018-09-08 ENCOUNTER — Ambulatory Visit (INDEPENDENT_AMBULATORY_CARE_PROVIDER_SITE_OTHER): Payer: BC Managed Care – PPO | Admitting: *Deleted

## 2018-09-08 DIAGNOSIS — J309 Allergic rhinitis, unspecified: Secondary | ICD-10-CM

## 2018-09-14 ENCOUNTER — Ambulatory Visit (INDEPENDENT_AMBULATORY_CARE_PROVIDER_SITE_OTHER): Payer: BC Managed Care – PPO

## 2018-09-14 DIAGNOSIS — J309 Allergic rhinitis, unspecified: Secondary | ICD-10-CM | POA: Diagnosis not present

## 2018-09-21 ENCOUNTER — Ambulatory Visit (INDEPENDENT_AMBULATORY_CARE_PROVIDER_SITE_OTHER): Payer: BC Managed Care – PPO

## 2018-09-21 DIAGNOSIS — J309 Allergic rhinitis, unspecified: Secondary | ICD-10-CM

## 2018-09-28 ENCOUNTER — Ambulatory Visit (INDEPENDENT_AMBULATORY_CARE_PROVIDER_SITE_OTHER): Payer: BC Managed Care – PPO

## 2018-09-28 DIAGNOSIS — J309 Allergic rhinitis, unspecified: Secondary | ICD-10-CM | POA: Diagnosis not present

## 2018-10-05 ENCOUNTER — Ambulatory Visit (INDEPENDENT_AMBULATORY_CARE_PROVIDER_SITE_OTHER): Payer: BC Managed Care – PPO | Admitting: *Deleted

## 2018-10-05 DIAGNOSIS — J309 Allergic rhinitis, unspecified: Secondary | ICD-10-CM | POA: Diagnosis not present

## 2018-10-12 ENCOUNTER — Ambulatory Visit (INDEPENDENT_AMBULATORY_CARE_PROVIDER_SITE_OTHER): Payer: BC Managed Care – PPO | Admitting: *Deleted

## 2018-10-12 DIAGNOSIS — J309 Allergic rhinitis, unspecified: Secondary | ICD-10-CM | POA: Diagnosis not present

## 2018-10-19 ENCOUNTER — Ambulatory Visit (INDEPENDENT_AMBULATORY_CARE_PROVIDER_SITE_OTHER): Payer: BC Managed Care – PPO | Admitting: *Deleted

## 2018-10-19 DIAGNOSIS — J309 Allergic rhinitis, unspecified: Secondary | ICD-10-CM

## 2018-10-26 ENCOUNTER — Ambulatory Visit (INDEPENDENT_AMBULATORY_CARE_PROVIDER_SITE_OTHER): Payer: BC Managed Care – PPO | Admitting: *Deleted

## 2018-10-26 DIAGNOSIS — J309 Allergic rhinitis, unspecified: Secondary | ICD-10-CM

## 2018-11-02 ENCOUNTER — Ambulatory Visit (INDEPENDENT_AMBULATORY_CARE_PROVIDER_SITE_OTHER): Payer: BC Managed Care – PPO | Admitting: *Deleted

## 2018-11-02 DIAGNOSIS — J309 Allergic rhinitis, unspecified: Secondary | ICD-10-CM

## 2018-11-09 ENCOUNTER — Ambulatory Visit (INDEPENDENT_AMBULATORY_CARE_PROVIDER_SITE_OTHER): Payer: BC Managed Care – PPO | Admitting: *Deleted

## 2018-11-09 DIAGNOSIS — J309 Allergic rhinitis, unspecified: Secondary | ICD-10-CM

## 2018-11-16 ENCOUNTER — Ambulatory Visit (INDEPENDENT_AMBULATORY_CARE_PROVIDER_SITE_OTHER): Payer: BC Managed Care – PPO

## 2018-11-16 DIAGNOSIS — J309 Allergic rhinitis, unspecified: Secondary | ICD-10-CM

## 2018-11-25 ENCOUNTER — Ambulatory Visit (INDEPENDENT_AMBULATORY_CARE_PROVIDER_SITE_OTHER): Payer: BC Managed Care – PPO | Admitting: *Deleted

## 2018-11-25 DIAGNOSIS — J309 Allergic rhinitis, unspecified: Secondary | ICD-10-CM

## 2018-11-30 ENCOUNTER — Ambulatory Visit (INDEPENDENT_AMBULATORY_CARE_PROVIDER_SITE_OTHER): Payer: BC Managed Care – PPO

## 2018-11-30 DIAGNOSIS — J309 Allergic rhinitis, unspecified: Secondary | ICD-10-CM

## 2018-12-01 DIAGNOSIS — E119 Type 2 diabetes mellitus without complications: Secondary | ICD-10-CM

## 2018-12-01 HISTORY — DX: Type 2 diabetes mellitus without complications: E11.9

## 2018-12-08 ENCOUNTER — Telehealth: Payer: Self-pay | Admitting: *Deleted

## 2018-12-08 ENCOUNTER — Ambulatory Visit (INDEPENDENT_AMBULATORY_CARE_PROVIDER_SITE_OTHER): Payer: BC Managed Care – PPO | Admitting: *Deleted

## 2018-12-08 DIAGNOSIS — J309 Allergic rhinitis, unspecified: Secondary | ICD-10-CM

## 2018-12-08 NOTE — Telephone Encounter (Signed)
Noted!  Thank you for the update.  Azaryah Oleksy, MD [facility]  

## 2018-12-08 NOTE — Telephone Encounter (Signed)
Patient wanted you to be aware that his PCP has taken him out of work until the 1st of the year due to [place] and his susceptibility to contracting the virus.

## 2018-12-14 ENCOUNTER — Ambulatory Visit (INDEPENDENT_AMBULATORY_CARE_PROVIDER_SITE_OTHER): Payer: BC Managed Care – PPO

## 2018-12-14 DIAGNOSIS — J309 Allergic rhinitis, unspecified: Secondary | ICD-10-CM

## 2018-12-21 ENCOUNTER — Ambulatory Visit (INDEPENDENT_AMBULATORY_CARE_PROVIDER_SITE_OTHER): Payer: BC Managed Care – PPO | Admitting: *Deleted

## 2018-12-21 DIAGNOSIS — J309 Allergic rhinitis, unspecified: Secondary | ICD-10-CM | POA: Diagnosis not present

## 2018-12-28 ENCOUNTER — Ambulatory Visit (INDEPENDENT_AMBULATORY_CARE_PROVIDER_SITE_OTHER): Payer: BC Managed Care – PPO

## 2018-12-28 DIAGNOSIS — J302 Other seasonal allergic rhinitis: Secondary | ICD-10-CM

## 2018-12-28 DIAGNOSIS — J3089 Other allergic rhinitis: Secondary | ICD-10-CM

## 2019-01-04 ENCOUNTER — Ambulatory Visit (INDEPENDENT_AMBULATORY_CARE_PROVIDER_SITE_OTHER): Payer: BC Managed Care – PPO

## 2019-01-04 DIAGNOSIS — J309 Allergic rhinitis, unspecified: Secondary | ICD-10-CM | POA: Diagnosis not present

## 2019-01-11 ENCOUNTER — Ambulatory Visit (INDEPENDENT_AMBULATORY_CARE_PROVIDER_SITE_OTHER): Payer: BC Managed Care – PPO | Admitting: *Deleted

## 2019-01-11 DIAGNOSIS — J309 Allergic rhinitis, unspecified: Secondary | ICD-10-CM

## 2019-01-17 ENCOUNTER — Other Ambulatory Visit: Payer: Self-pay | Admitting: Allergy & Immunology

## 2019-01-18 ENCOUNTER — Ambulatory Visit (INDEPENDENT_AMBULATORY_CARE_PROVIDER_SITE_OTHER): Payer: BC Managed Care – PPO

## 2019-01-18 DIAGNOSIS — J309 Allergic rhinitis, unspecified: Secondary | ICD-10-CM | POA: Diagnosis not present

## 2019-01-21 DIAGNOSIS — J301 Allergic rhinitis due to pollen: Secondary | ICD-10-CM

## 2019-01-25 ENCOUNTER — Ambulatory Visit (INDEPENDENT_AMBULATORY_CARE_PROVIDER_SITE_OTHER): Payer: BC Managed Care – PPO

## 2019-01-25 DIAGNOSIS — J309 Allergic rhinitis, unspecified: Secondary | ICD-10-CM

## 2019-02-01 ENCOUNTER — Ambulatory Visit (INDEPENDENT_AMBULATORY_CARE_PROVIDER_SITE_OTHER): Payer: BC Managed Care – PPO

## 2019-02-01 DIAGNOSIS — J309 Allergic rhinitis, unspecified: Secondary | ICD-10-CM

## 2019-02-08 ENCOUNTER — Ambulatory Visit (INDEPENDENT_AMBULATORY_CARE_PROVIDER_SITE_OTHER): Payer: BC Managed Care – PPO

## 2019-02-08 DIAGNOSIS — J309 Allergic rhinitis, unspecified: Secondary | ICD-10-CM

## 2019-02-15 ENCOUNTER — Ambulatory Visit (INDEPENDENT_AMBULATORY_CARE_PROVIDER_SITE_OTHER): Payer: BC Managed Care – PPO | Admitting: *Deleted

## 2019-02-15 DIAGNOSIS — J309 Allergic rhinitis, unspecified: Secondary | ICD-10-CM | POA: Diagnosis not present

## 2019-02-17 DIAGNOSIS — J4520 Mild intermittent asthma, uncomplicated: Secondary | ICD-10-CM

## 2019-02-17 HISTORY — DX: Mild intermittent asthma, uncomplicated: J45.20

## 2019-02-22 ENCOUNTER — Ambulatory Visit (INDEPENDENT_AMBULATORY_CARE_PROVIDER_SITE_OTHER): Payer: BC Managed Care – PPO | Admitting: *Deleted

## 2019-02-22 DIAGNOSIS — J309 Allergic rhinitis, unspecified: Secondary | ICD-10-CM | POA: Diagnosis not present

## 2019-03-01 ENCOUNTER — Ambulatory Visit (INDEPENDENT_AMBULATORY_CARE_PROVIDER_SITE_OTHER): Payer: BC Managed Care – PPO

## 2019-03-01 DIAGNOSIS — J309 Allergic rhinitis, unspecified: Secondary | ICD-10-CM

## 2019-03-05 ENCOUNTER — Other Ambulatory Visit: Payer: Self-pay | Admitting: Allergy & Immunology

## 2019-03-08 ENCOUNTER — Telehealth: Payer: Self-pay

## 2019-03-08 ENCOUNTER — Ambulatory Visit (INDEPENDENT_AMBULATORY_CARE_PROVIDER_SITE_OTHER): Payer: BC Managed Care – PPO

## 2019-03-08 DIAGNOSIS — J309 Allergic rhinitis, unspecified: Secondary | ICD-10-CM | POA: Diagnosis not present

## 2019-03-08 NOTE — Telephone Encounter (Signed)
Reviewed the previous notes. It seems that his PCP took him out of work (see Telephone note from 12/08/2018). I would defer to his PCP about returning to work. I follow him for allergic rhinitis, which does not pre-dispose him by itself to COVID-19.   Salvatore Marvel, MD [facility] [place] [facility]

## 2019-03-08 NOTE — Telephone Encounter (Signed)
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 He can answer it tomorrow. Thanks.

## 2019-03-08 NOTE — Telephone Encounter (Signed)
Dr. Ernst Bowler please advise on FMLA and see if it is okay for him to comeback to work.

## 2019-03-08 NOTE — Telephone Encounter (Signed)
Patient came into the clinic to get his shot and is wanting to know if he is eligible to go back to work in the beginning of the year for his FMLA to the [employer]. Spoke with his PCP and stated they will agree to what ever terms you are wanting for Levi Blankenship.

## 2019-03-09 NOTE — Telephone Encounter (Signed)
Attempted to call patient. No answer. Unable to leave message.

## 2019-03-15 ENCOUNTER — Ambulatory Visit (INDEPENDENT_AMBULATORY_CARE_PROVIDER_SITE_OTHER): Payer: BC Managed Care – PPO

## 2019-03-15 DIAGNOSIS — J309 Allergic rhinitis, unspecified: Secondary | ICD-10-CM

## 2019-03-15 NOTE — Telephone Encounter (Signed)
Call to patient to make him aware that he needs to call his PCP to return to work.  Pt is okay with this plan and verbalized understanding.  Will call PCP.  Call ended.

## 2019-03-22 ENCOUNTER — Ambulatory Visit (INDEPENDENT_AMBULATORY_CARE_PROVIDER_SITE_OTHER): Payer: BC Managed Care – PPO | Admitting: *Deleted

## 2019-03-22 DIAGNOSIS — J309 Allergic rhinitis, unspecified: Secondary | ICD-10-CM

## 2019-03-29 ENCOUNTER — Ambulatory Visit (INDEPENDENT_AMBULATORY_CARE_PROVIDER_SITE_OTHER): Payer: BC Managed Care – PPO

## 2019-03-29 DIAGNOSIS — J309 Allergic rhinitis, unspecified: Secondary | ICD-10-CM | POA: Diagnosis not present

## 2019-04-05 ENCOUNTER — Ambulatory Visit (INDEPENDENT_AMBULATORY_CARE_PROVIDER_SITE_OTHER): Payer: BC Managed Care – PPO

## 2019-04-05 DIAGNOSIS — J309 Allergic rhinitis, unspecified: Secondary | ICD-10-CM

## 2019-04-12 ENCOUNTER — Ambulatory Visit (INDEPENDENT_AMBULATORY_CARE_PROVIDER_SITE_OTHER): Payer: BC Managed Care – PPO

## 2019-04-12 DIAGNOSIS — J309 Allergic rhinitis, unspecified: Secondary | ICD-10-CM

## 2019-04-15 ENCOUNTER — Other Ambulatory Visit: Payer: Self-pay

## 2019-04-19 ENCOUNTER — Ambulatory Visit (INDEPENDENT_AMBULATORY_CARE_PROVIDER_SITE_OTHER): Payer: BC Managed Care – PPO | Admitting: *Deleted

## 2019-04-19 DIAGNOSIS — J309 Allergic rhinitis, unspecified: Secondary | ICD-10-CM

## 2019-04-26 ENCOUNTER — Ambulatory Visit (INDEPENDENT_AMBULATORY_CARE_PROVIDER_SITE_OTHER): Payer: BC Managed Care – PPO

## 2019-04-26 DIAGNOSIS — J309 Allergic rhinitis, unspecified: Secondary | ICD-10-CM

## 2019-04-26 NOTE — Progress Notes (Signed)
Exp 04/27/19

## 2019-04-27 DIAGNOSIS — J3089 Other allergic rhinitis: Secondary | ICD-10-CM

## 2019-05-03 ENCOUNTER — Ambulatory Visit (INDEPENDENT_AMBULATORY_CARE_PROVIDER_SITE_OTHER): Payer: BC Managed Care – PPO

## 2019-05-03 ENCOUNTER — Telehealth: Payer: Self-pay | Admitting: Allergy & Immunology

## 2019-05-03 DIAGNOSIS — J309 Allergic rhinitis, unspecified: Secondary | ICD-10-CM

## 2019-05-03 NOTE — Telephone Encounter (Signed)
Called patient to schedule a follow up office visit for insurance purposes due to injections.

## 2019-05-10 ENCOUNTER — Ambulatory Visit (INDEPENDENT_AMBULATORY_CARE_PROVIDER_SITE_OTHER): Payer: BC Managed Care – PPO

## 2019-05-10 DIAGNOSIS — J309 Allergic rhinitis, unspecified: Secondary | ICD-10-CM | POA: Diagnosis not present

## 2019-05-17 ENCOUNTER — Ambulatory Visit (INDEPENDENT_AMBULATORY_CARE_PROVIDER_SITE_OTHER): Payer: BC Managed Care – PPO

## 2019-05-17 DIAGNOSIS — J309 Allergic rhinitis, unspecified: Secondary | ICD-10-CM

## 2019-05-24 ENCOUNTER — Ambulatory Visit (INDEPENDENT_AMBULATORY_CARE_PROVIDER_SITE_OTHER): Payer: BC Managed Care – PPO

## 2019-05-24 DIAGNOSIS — J309 Allergic rhinitis, unspecified: Secondary | ICD-10-CM | POA: Diagnosis not present

## 2019-05-27 ENCOUNTER — Ambulatory Visit: Payer: BC Managed Care – PPO | Attending: Internal Medicine

## 2019-05-27 DIAGNOSIS — Z23 Encounter for immunization: Secondary | ICD-10-CM

## 2019-05-27 NOTE — Progress Notes (Signed)
   Covid-19 Vaccination Clinic  Name:  Levi Blankenship    MRN: [id number] DOB: 1960-08-23  05/27/2019  Levi Blankenship was observed post Covid-19 immunization for 15 minutes without incident. He was provided with Vaccine Information Sheet and instruction to access the V-Safe system.   Levi Blankenship was instructed to call 911 with any severe reactions post vaccine: Marland Kitchen Difficulty breathing  . Swelling of face and throat  . A fast heartbeat  . A bad rash all over body  . Dizziness and weakness   Immunizations Administered    Name Date Dose VIS Date Route   Moderna COVID-19 Vaccine 05/27/2019  3:11 PM 0.5 mL 02/16/2019 Intramuscular   Manufacturer: Moderna   Lot: [id number]   [place][id number]

## 2019-05-31 ENCOUNTER — Ambulatory Visit (INDEPENDENT_AMBULATORY_CARE_PROVIDER_SITE_OTHER): Payer: BC Managed Care – PPO

## 2019-05-31 DIAGNOSIS — J309 Allergic rhinitis, unspecified: Secondary | ICD-10-CM | POA: Diagnosis not present

## 2019-06-07 ENCOUNTER — Ambulatory Visit (INDEPENDENT_AMBULATORY_CARE_PROVIDER_SITE_OTHER): Payer: BC Managed Care – PPO

## 2019-06-07 DIAGNOSIS — J309 Allergic rhinitis, unspecified: Secondary | ICD-10-CM

## 2019-06-14 ENCOUNTER — Ambulatory Visit (INDEPENDENT_AMBULATORY_CARE_PROVIDER_SITE_OTHER): Payer: BC Managed Care – PPO

## 2019-06-14 DIAGNOSIS — J309 Allergic rhinitis, unspecified: Secondary | ICD-10-CM | POA: Diagnosis not present

## 2019-06-28 ENCOUNTER — Ambulatory Visit: Payer: Self-pay

## 2019-06-29 ENCOUNTER — Ambulatory Visit: Payer: BC Managed Care – PPO | Attending: Family

## 2019-06-29 DIAGNOSIS — Z23 Encounter for immunization: Secondary | ICD-10-CM

## 2019-06-29 NOTE — Progress Notes (Signed)
   Covid-19 Vaccination Clinic  Name:  Levi Blankenship    MRN: [id number] DOB: December 04, 1960  06/29/2019  Levi Blankenship was observed post Covid-19 immunization for 15 minutes without incident. He was provided with Vaccine Information Sheet and instruction to access the V-Safe system.   Levi Blankenship was instructed to call 911 with any severe reactions post vaccine: Marland Kitchen Difficulty breathing  . Swelling of face and throat  . A fast heartbeat  . A bad rash all over body  . Dizziness and weakness   Immunizations Administered    Name Date Dose VIS Date Route   Moderna COVID-19 Vaccine 06/29/2019 10:09 AM 0.5 mL 02/16/2019 Intramuscular   Manufacturer: Moderna   Lot: [id number]   [place][id number]

## 2019-07-01 ENCOUNTER — Ambulatory Visit (INDEPENDENT_AMBULATORY_CARE_PROVIDER_SITE_OTHER): Payer: BC Managed Care – PPO

## 2019-07-01 DIAGNOSIS — J309 Allergic rhinitis, unspecified: Secondary | ICD-10-CM | POA: Diagnosis not present

## 2019-07-12 ENCOUNTER — Ambulatory Visit (INDEPENDENT_AMBULATORY_CARE_PROVIDER_SITE_OTHER): Payer: BC Managed Care – PPO

## 2019-07-12 DIAGNOSIS — J309 Allergic rhinitis, unspecified: Secondary | ICD-10-CM | POA: Diagnosis not present

## 2019-07-27 ENCOUNTER — Ambulatory Visit (INDEPENDENT_AMBULATORY_CARE_PROVIDER_SITE_OTHER): Payer: BC Managed Care – PPO

## 2019-07-27 DIAGNOSIS — J309 Allergic rhinitis, unspecified: Secondary | ICD-10-CM | POA: Diagnosis not present

## 2019-08-03 DIAGNOSIS — J3089 Other allergic rhinitis: Secondary | ICD-10-CM

## 2019-08-03 NOTE — Progress Notes (Signed)
VIALS EXP 08-02-20 

## 2019-08-09 ENCOUNTER — Ambulatory Visit (INDEPENDENT_AMBULATORY_CARE_PROVIDER_SITE_OTHER): Payer: BC Managed Care – PPO

## 2019-08-09 DIAGNOSIS — J309 Allergic rhinitis, unspecified: Secondary | ICD-10-CM

## 2019-08-24 ENCOUNTER — Ambulatory Visit (INDEPENDENT_AMBULATORY_CARE_PROVIDER_SITE_OTHER): Payer: BC Managed Care – PPO | Admitting: Allergy & Immunology

## 2019-08-24 ENCOUNTER — Other Ambulatory Visit: Payer: Self-pay

## 2019-08-24 ENCOUNTER — Encounter: Payer: Self-pay | Admitting: Allergy & Immunology

## 2019-08-24 VITALS — BP 146/98 | HR 96 | Temp 96.4°F | Resp 17

## 2019-08-24 DIAGNOSIS — J309 Allergic rhinitis, unspecified: Secondary | ICD-10-CM | POA: Diagnosis not present

## 2019-08-24 MED ORDER — ZERVIATE 0.24 % OP SOLN: 2.0000 [drp] | Freq: Two times a day (BID) | OPHTHALMIC | 5 refills | Status: DC

## 2019-08-24 MED ORDER — FLUTICASONE PROPIONATE 50 MCG/ACT NA SUSP: 2.0000 | Freq: Every day | NASAL | 5 refills | Status: DC

## 2019-08-24 MED ORDER — LEVOCETIRIZINE DIHYDROCHLORIDE 5 MG PO TABS: ORAL_TABLET | ORAL | 5 refills | Status: DC

## 2019-08-24 NOTE — Patient Instructions (Addendum)
1. Chronic rhinitis (grasses, ragweed, weeds, trees, indoor molds, outdoor molds, dust mites, cat and cockroach) - Continue with allergy shots at the same schedule.  - Continue taking: alternating antihistamines as you are doing (more Xyzal samples provided today) - Restart taking: Flonase (fluticasone) one spray per nostril up to twice daily (best used on a consistent visit) - Call us with any problems.   2. Return in about 1 year (around 08/23/2020). This can be an in-person, a virtual Webex or a telephone follow up visit.   Please inform us of any Emergency Department visits, hospitalizations, or changes in symptoms. Call us before going to the ED for breathing or allergy symptoms since we might be able to fit you in for a sick visit. Feel free to contact us anytime with any questions, problems, or concerns.  It was a pleasure to see you again today!  Websites that have reliable patient information:  1. American Academy of Asthma, Allergy, and Immunology: www.aaaai.org 2. Food Allergy Research and Education (FARE): foodallergy.org 3. Mothers of Asthmatics: http://www.asthmacommunitynetwork.org 4. American College of Allergy, Asthma, and Immunology: www.acaai.org   COVID-19 Vaccine Information can be found at: ShippingScam.co.uk For questions related to vaccine distribution or appointments, please email vaccine@[facility] .com or call [phone].     Like [place] on [place] and Instagram for our latest updates!        Make sure you are registered to vote! If you have moved or changed any of your contact information, you will need to get this updated before voting!  In some cases, you MAY be able to register to vote online: [email or web address]

## 2019-08-24 NOTE — Progress Notes (Signed)
FOLLOW UP  Date of Service/Encounter:  08/24/19   Assessment:   Seasonal and perennial allergic rhinitis (grasses, ragweed, weeds, trees, indoor molds, outdoor molds, dust mites, cat and cockroach) - on allergen immunotherapy with maintenance reached September 2020  Continued nasal congestion  Plan/Recommendations:   1. Chronic rhinitis (grasses, ragweed, weeds, trees, indoor molds, outdoor molds, dust mites, cat and cockroach) - Continue with allergy shots at the same schedule.  - Continue taking: alternating antihistamines as you are doing (more Xyzal samples provided today) - Restart taking: Flonase (fluticasone) one spray per nostril up to twice daily (best used on a consistent visit) - Call us with any problems.   2. Return in about 1 year (around 08/23/2020). This can be an in-person, a virtual Webex or a telephone follow up visit.   Subjective:   Levi Blankenship is a 59 y.o. male presenting today for follow up of  Chief Complaint  Patient presents with  . Allergic Rhinitis     sinus headaches, sneezing, runny nose     Levi Blankenship has a history of the following: Patient Active Problem List   Diagnosis Date Noted  . Seasonal and perennial allergic rhinitis 07/08/2018    History obtained from: chart review and patient and wife.  Levi Blankenship is a 59 y.o. male presenting for a follow up visit. Levi Blankenship was last seen in April 2020. At that time, we did testing that positive to grasses, ragweed, weeds, trees, indoor molds, outdoor molds, dust mites, cat and cockroach. We continued Nasacort as well as Zerviate and start Xyzal one tablet daily. Levi Blankenship made hte decision to start allergen immunotherapy. Levi Blankenship did make thre decision to start allergen immunotherapy.  Since the last visit, Levi Blankenship has done well. Levi Blankenship tells me that Levi Blankenship can be outside mowing the yard and fishing. Levi Blankenship reports that when Levi Blankenship goes outside and takes a shower, Levi Blankenship will start having rhinorrhea and other issues.   Levi Blankenship does  alternate antihistamines. Levi Blankenship Zyrtec, Allegra, and Claritin. Levi Blankenship has been on Xyzal with improvement in his symptoms. Overall symptoms are improving on the allergy shots. Levi Blankenship has not needed steroids or antibiotics at all.   Mujahid is on allergen immunotherapy. Levi Blankenship receives two injections. Immunotherapy script #1 contains molds, dust mites and cockroach. Levi Blankenship currently receives 0.70mL of the RED vial (1/100). Immunotherapy script #2 contains trees, weeds, grasses and cat. Levi Blankenship currently receives 0.36mL of the RED vial (1/100). Levi Blankenship started shots May of 2020 and reached maintenance in September of 2020.  Levi Blankenship did get his COVID vaccination without a problem.   Otherwise, there have been no changes to his past medical history, surgical history, family history, or social history.    Review of Systems  Constitutional: Negative.  Negative for chills, fever, malaise/fatigue and weight loss.  HENT: Positive for congestion. Negative for ear discharge, ear pain and sinus pain.        Positive for postnasal drip.  Eyes: Negative for pain, discharge and redness.  Respiratory: Negative for cough, sputum production, shortness of breath and wheezing.   Cardiovascular: Negative.  Negative for chest pain and palpitations.  Gastrointestinal: Negative for abdominal pain, constipation, diarrhea, heartburn, nausea and vomiting.  Skin: Negative.  Negative for itching and rash.  Neurological: Negative for dizziness and headaches.  Endo/Heme/Allergies: Positive for environmental allergies. Does not bruise/bleed easily.       Objective:   Blood pressure (!) 146/98, pulse 96, temperature (!) 96.4 F (35.8 C), temperature source Temporal, resp. rate 17,  SpO2 97 %. There is no height or weight on file to calculate BMI.   Physical Exam:  Physical Exam  Constitutional: Levi Blankenship appears well-developed.  Pleasant male.  Interactive.  HENT:  Head: Normocephalic and atraumatic.  Right Ear: Tympanic membrane, external ear and ear  canal normal.  Left Ear: Tympanic membrane and ear canal normal.  Nose: No mucosal edema, rhinorrhea, nasal deformity or septal deviation. No epistaxis. Right sinus exhibits no maxillary sinus tenderness and no frontal sinus tenderness. Left sinus exhibits no maxillary sinus tenderness and no frontal sinus tenderness.  Mouth/Throat: Uvula is midline and oropharynx is clear and moist. Mucous membranes are not pale and not dry.  Eyes: Pupils are equal, round, and reactive to light. Conjunctivae and EOM are normal. Right eye exhibits no chemosis and no discharge. Left eye exhibits no chemosis and no discharge. Right conjunctiva is not injected. Left conjunctiva is not injected.  Cardiovascular: Normal rate, regular rhythm and normal heart sounds.  Respiratory: Effort normal and breath sounds normal. No accessory muscle usage. No tachypnea. No respiratory distress. Levi Blankenship has no wheezes. Levi Blankenship has no rhonchi. Levi Blankenship has no rales. Levi Blankenship exhibits no tenderness.  Moving air well in all lung fields.  No increased work of breathing.  Lymphadenopathy:    Levi Blankenship has no cervical adenopathy.  Neurological: Levi Blankenship is alert.  Skin: No abrasion, no petechiae and no rash noted. Rash is not papular, not vesicular and not urticarial. No erythema. No pallor.  No eczematous or urticarial lesions noted.  Psychiatric: Levi Blankenship has a normal mood and affect.     Diagnostic studies: none     Salvatore Marvel, MD  [facility]

## 2019-08-25 ENCOUNTER — Encounter: Payer: Self-pay | Admitting: Allergy & Immunology

## 2019-09-06 ENCOUNTER — Ambulatory Visit (INDEPENDENT_AMBULATORY_CARE_PROVIDER_SITE_OTHER): Payer: BC Managed Care – PPO

## 2019-09-06 DIAGNOSIS — J309 Allergic rhinitis, unspecified: Secondary | ICD-10-CM | POA: Diagnosis not present

## 2019-09-21 ENCOUNTER — Ambulatory Visit (INDEPENDENT_AMBULATORY_CARE_PROVIDER_SITE_OTHER): Payer: BC Managed Care – PPO

## 2019-09-21 DIAGNOSIS — J309 Allergic rhinitis, unspecified: Secondary | ICD-10-CM | POA: Diagnosis not present

## 2019-10-04 ENCOUNTER — Ambulatory Visit (INDEPENDENT_AMBULATORY_CARE_PROVIDER_SITE_OTHER): Payer: BC Managed Care – PPO

## 2019-10-04 DIAGNOSIS — J309 Allergic rhinitis, unspecified: Secondary | ICD-10-CM

## 2019-10-18 ENCOUNTER — Ambulatory Visit (INDEPENDENT_AMBULATORY_CARE_PROVIDER_SITE_OTHER): Payer: BC Managed Care – PPO | Admitting: *Deleted

## 2019-10-18 DIAGNOSIS — J309 Allergic rhinitis, unspecified: Secondary | ICD-10-CM

## 2019-11-01 ENCOUNTER — Ambulatory Visit (INDEPENDENT_AMBULATORY_CARE_PROVIDER_SITE_OTHER): Payer: BC Managed Care – PPO

## 2019-11-01 DIAGNOSIS — J309 Allergic rhinitis, unspecified: Secondary | ICD-10-CM

## 2019-11-08 ENCOUNTER — Ambulatory Visit (INDEPENDENT_AMBULATORY_CARE_PROVIDER_SITE_OTHER): Payer: BC Managed Care – PPO

## 2019-11-08 DIAGNOSIS — J309 Allergic rhinitis, unspecified: Secondary | ICD-10-CM

## 2019-11-15 ENCOUNTER — Ambulatory Visit (INDEPENDENT_AMBULATORY_CARE_PROVIDER_SITE_OTHER): Payer: BC Managed Care – PPO

## 2019-11-15 DIAGNOSIS — J309 Allergic rhinitis, unspecified: Secondary | ICD-10-CM | POA: Diagnosis not present

## 2019-11-23 ENCOUNTER — Ambulatory Visit (INDEPENDENT_AMBULATORY_CARE_PROVIDER_SITE_OTHER): Payer: BC Managed Care – PPO | Admitting: *Deleted

## 2019-11-23 DIAGNOSIS — J309 Allergic rhinitis, unspecified: Secondary | ICD-10-CM

## 2019-12-06 ENCOUNTER — Ambulatory Visit (INDEPENDENT_AMBULATORY_CARE_PROVIDER_SITE_OTHER): Payer: BC Managed Care – PPO

## 2019-12-06 DIAGNOSIS — J309 Allergic rhinitis, unspecified: Secondary | ICD-10-CM

## 2019-12-20 ENCOUNTER — Ambulatory Visit (INDEPENDENT_AMBULATORY_CARE_PROVIDER_SITE_OTHER): Payer: BC Managed Care – PPO

## 2019-12-20 DIAGNOSIS — J309 Allergic rhinitis, unspecified: Secondary | ICD-10-CM

## 2019-12-22 DIAGNOSIS — J3089 Other allergic rhinitis: Secondary | ICD-10-CM

## 2019-12-22 NOTE — Progress Notes (Signed)
Vials exp 12-21-20

## 2020-01-03 ENCOUNTER — Ambulatory Visit (INDEPENDENT_AMBULATORY_CARE_PROVIDER_SITE_OTHER): Payer: BC Managed Care – PPO

## 2020-01-03 DIAGNOSIS — J309 Allergic rhinitis, unspecified: Secondary | ICD-10-CM

## 2020-01-18 ENCOUNTER — Ambulatory Visit (INDEPENDENT_AMBULATORY_CARE_PROVIDER_SITE_OTHER): Payer: BC Managed Care – PPO | Admitting: *Deleted

## 2020-01-18 DIAGNOSIS — J309 Allergic rhinitis, unspecified: Secondary | ICD-10-CM | POA: Diagnosis not present

## 2020-01-31 ENCOUNTER — Ambulatory Visit (INDEPENDENT_AMBULATORY_CARE_PROVIDER_SITE_OTHER): Payer: BC Managed Care – PPO

## 2020-01-31 DIAGNOSIS — J309 Allergic rhinitis, unspecified: Secondary | ICD-10-CM

## 2020-02-14 ENCOUNTER — Ambulatory Visit (INDEPENDENT_AMBULATORY_CARE_PROVIDER_SITE_OTHER): Payer: BC Managed Care – PPO

## 2020-02-14 DIAGNOSIS — J309 Allergic rhinitis, unspecified: Secondary | ICD-10-CM | POA: Diagnosis not present

## 2020-02-21 ENCOUNTER — Ambulatory Visit (INDEPENDENT_AMBULATORY_CARE_PROVIDER_SITE_OTHER): Payer: BC Managed Care – PPO | Admitting: *Deleted

## 2020-02-21 DIAGNOSIS — J309 Allergic rhinitis, unspecified: Secondary | ICD-10-CM

## 2020-02-28 ENCOUNTER — Encounter: Payer: Self-pay | Admitting: Allergy

## 2020-03-06 ENCOUNTER — Ambulatory Visit (INDEPENDENT_AMBULATORY_CARE_PROVIDER_SITE_OTHER): Payer: BC Managed Care – PPO

## 2020-03-06 DIAGNOSIS — J309 Allergic rhinitis, unspecified: Secondary | ICD-10-CM | POA: Diagnosis not present

## 2020-03-14 ENCOUNTER — Ambulatory Visit (INDEPENDENT_AMBULATORY_CARE_PROVIDER_SITE_OTHER): Payer: BC Managed Care – PPO | Admitting: *Deleted

## 2020-03-14 DIAGNOSIS — J309 Allergic rhinitis, unspecified: Secondary | ICD-10-CM

## 2020-03-20 ENCOUNTER — Ambulatory Visit (INDEPENDENT_AMBULATORY_CARE_PROVIDER_SITE_OTHER): Payer: BC Managed Care – PPO | Admitting: *Deleted

## 2020-03-20 DIAGNOSIS — J309 Allergic rhinitis, unspecified: Secondary | ICD-10-CM

## 2020-03-28 ENCOUNTER — Ambulatory Visit: Payer: Self-pay

## 2020-04-06 ENCOUNTER — Ambulatory Visit (INDEPENDENT_AMBULATORY_CARE_PROVIDER_SITE_OTHER): Payer: BC Managed Care – PPO

## 2020-04-06 DIAGNOSIS — J309 Allergic rhinitis, unspecified: Secondary | ICD-10-CM

## 2020-04-17 ENCOUNTER — Ambulatory Visit (INDEPENDENT_AMBULATORY_CARE_PROVIDER_SITE_OTHER): Payer: BC Managed Care – PPO

## 2020-04-17 DIAGNOSIS — J309 Allergic rhinitis, unspecified: Secondary | ICD-10-CM

## 2020-05-01 DIAGNOSIS — J3089 Other allergic rhinitis: Secondary | ICD-10-CM

## 2020-05-01 NOTE — Progress Notes (Signed)
VIALS EXP 05-01-21 

## 2020-05-08 ENCOUNTER — Ambulatory Visit (INDEPENDENT_AMBULATORY_CARE_PROVIDER_SITE_OTHER): Payer: BC Managed Care – PPO

## 2020-05-08 DIAGNOSIS — J309 Allergic rhinitis, unspecified: Secondary | ICD-10-CM

## 2020-05-29 ENCOUNTER — Ambulatory Visit (INDEPENDENT_AMBULATORY_CARE_PROVIDER_SITE_OTHER): Payer: BC Managed Care – PPO

## 2020-05-29 DIAGNOSIS — J309 Allergic rhinitis, unspecified: Secondary | ICD-10-CM | POA: Diagnosis not present

## 2020-06-12 ENCOUNTER — Ambulatory Visit (INDEPENDENT_AMBULATORY_CARE_PROVIDER_SITE_OTHER): Payer: BC Managed Care – PPO

## 2020-06-12 DIAGNOSIS — J309 Allergic rhinitis, unspecified: Secondary | ICD-10-CM

## 2020-06-26 ENCOUNTER — Ambulatory Visit: Payer: Self-pay

## 2020-07-04 ENCOUNTER — Ambulatory Visit (INDEPENDENT_AMBULATORY_CARE_PROVIDER_SITE_OTHER): Payer: BC Managed Care – PPO | Admitting: *Deleted

## 2020-07-04 DIAGNOSIS — J309 Allergic rhinitis, unspecified: Secondary | ICD-10-CM | POA: Diagnosis not present

## 2020-07-28 ENCOUNTER — Ambulatory Visit (INDEPENDENT_AMBULATORY_CARE_PROVIDER_SITE_OTHER): Payer: BC Managed Care – PPO

## 2020-07-28 DIAGNOSIS — J309 Allergic rhinitis, unspecified: Secondary | ICD-10-CM

## 2020-08-02 ENCOUNTER — Ambulatory Visit (INDEPENDENT_AMBULATORY_CARE_PROVIDER_SITE_OTHER): Payer: BC Managed Care – PPO

## 2020-08-02 DIAGNOSIS — J309 Allergic rhinitis, unspecified: Secondary | ICD-10-CM

## 2020-08-07 ENCOUNTER — Ambulatory Visit (INDEPENDENT_AMBULATORY_CARE_PROVIDER_SITE_OTHER): Payer: BC Managed Care – PPO

## 2020-08-07 DIAGNOSIS — J309 Allergic rhinitis, unspecified: Secondary | ICD-10-CM | POA: Diagnosis not present

## 2020-08-15 ENCOUNTER — Ambulatory Visit (INDEPENDENT_AMBULATORY_CARE_PROVIDER_SITE_OTHER): Payer: BC Managed Care – PPO | Admitting: *Deleted

## 2020-08-15 DIAGNOSIS — J309 Allergic rhinitis, unspecified: Secondary | ICD-10-CM | POA: Diagnosis not present

## 2020-08-22 ENCOUNTER — Ambulatory Visit (INDEPENDENT_AMBULATORY_CARE_PROVIDER_SITE_OTHER): Payer: BC Managed Care – PPO | Admitting: *Deleted

## 2020-08-22 DIAGNOSIS — J309 Allergic rhinitis, unspecified: Secondary | ICD-10-CM

## 2020-09-12 ENCOUNTER — Ambulatory Visit (INDEPENDENT_AMBULATORY_CARE_PROVIDER_SITE_OTHER): Payer: BC Managed Care – PPO | Admitting: *Deleted

## 2020-09-12 DIAGNOSIS — J309 Allergic rhinitis, unspecified: Secondary | ICD-10-CM

## 2020-10-02 ENCOUNTER — Ambulatory Visit (INDEPENDENT_AMBULATORY_CARE_PROVIDER_SITE_OTHER): Payer: BC Managed Care – PPO | Admitting: *Deleted

## 2020-10-02 DIAGNOSIS — J309 Allergic rhinitis, unspecified: Secondary | ICD-10-CM

## 2020-10-04 ENCOUNTER — Ambulatory Visit: Payer: Self-pay

## 2020-10-04 ENCOUNTER — Encounter: Payer: Self-pay | Admitting: Orthopaedic Surgery

## 2020-10-04 ENCOUNTER — Other Ambulatory Visit: Payer: Self-pay

## 2020-10-04 ENCOUNTER — Ambulatory Visit (INDEPENDENT_AMBULATORY_CARE_PROVIDER_SITE_OTHER): Payer: BC Managed Care – PPO | Admitting: Orthopaedic Surgery

## 2020-10-04 DIAGNOSIS — M25512 Pain in left shoulder: Secondary | ICD-10-CM

## 2020-10-04 DIAGNOSIS — G8929 Other chronic pain: Secondary | ICD-10-CM

## 2020-10-04 NOTE — Progress Notes (Signed)
Office Visit Note   Patient: Levi Blankenship. Levi Blankenship           Date of Birth: 1961-01-26           MRN: [id number] Visit Date: 10/04/2020              Requested by: Venida Jarvis, MD [address],  [place] [address] PCP: Venida Jarvis, MD   Assessment & Plan: Visit Diagnoses:  1. Chronic left shoulder pain     Plan: Impression is left subscapularis tear.  This may be just limited to the upper subscap based on findings but given temporary relief from prior injection recommend MR arthrogram to evaluate for structural abnormalities.  Work note provided.  Follow-up after the MRI.  Follow-Up Instructions: Return for Follow-up after MRI.   Orders:  Orders Placed This Encounter  Procedures   XR Shoulder Left   No orders of the defined types were placed in this encounter.     Procedures: No procedures performed   Clinical Data: No additional findings.   Subjective: Chief Complaint  Patient presents with   Left Shoulder - Pain    Levi Blankenship is a very pleasant 60 year old gentleman here for evaluation of chronic left shoulder pain for about 2 to 3 months.  This occurred acutely after an injury in which she helped a neighbor pick up a lumbar and felt a tearing sensation in the shoulder.  He saw his PCP on 711 and received cortisone injection which helped temporarily.  He has maintained good range of motion but the pain is returned.  Denies any numbness and tingling or radicular symptoms.  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  He has pain with lifting over head and sometimes will drop things out of his hand due to the pain in the shoulder.   Review of Systems  Constitutional: Negative.   All other systems reviewed and are negative.   Objective: Vital Signs: There were no vitals taken for this visit.  Physical Exam Vitals and nursing note reviewed.  Constitutional:      Appearance: He is well-developed.  HENT:      Head: Normocephalic and atraumatic.  Eyes:     Pupils: Pupils are equal, round, and reactive to light.  Pulmonary:     Effort: Pulmonary effort is normal.  Abdominal:     Palpations: Abdomen is soft.  Musculoskeletal:        General: Normal range of motion.     Cervical back: Neck supple.  Skin:    General: Skin is warm.  Neurological:     Mental Status: He is alert and oriented to person, place, and time.  Psychiatric:        Behavior: Behavior normal.        Thought Content: Thought content normal.        Judgment: Judgment normal.    Ortho Exam Left shoulder shows fairly normal range of motion with mild pain at the extremes.  Pain and minimal weakness with bearhug.  Pain and mild weakness with testing of infraspinatus.  Positive Hawkins sign.  Negative empty can.  AC joint is not overly tender. Specialty Comments:  No specialty comments available.  Imaging: XR Shoulder Left  Result Date: 10/04/2020 Moderate degenerative changes of the [facility] joint.  Glenohumeral joint appears to be well preserved.  Questionable os acromiale.    PMFS History: Patient Active Problem List   Diagnosis Date Noted   Mild intermittent  asthma without complication 74/71/5953   New onset type 2 diabetes mellitus ([place]) 12/01/2018   Seasonal and perennial allergic rhinitis 07/08/2018   Laryngopharyngeal reflux (LPR) 01/13/2018   Allergy 01/13/2018   Morbid obesity with BMI of 45.0-49.9, adult ([place]) 04/13/2017   OSA (obstructive sleep apnea) 04/13/2017   Primary osteoarthritis involving multiple joints 07/20/2014   Hypertension, benign 03/22/2003   Past Medical History:  Diagnosis Date   Hypertension    Mild intermittent asthma without complication 96/09/2895   New onset type 2 diabetes mellitus ([place]) 12/01/2018   Pre-diabetes    Sleep apnea    not currently using c-pap    Family History  Problem Relation Age of Onset   Colon cancer Maternal Grandmother    Diabetes Father     Past Surgical  History:  Procedure Laterality Date   TONSILLECTOMY     tumor neck     benign and removed   Social History   Occupational History   Not on file  Tobacco Use   Smoking status: Some Days    Packs/day: 0.25    Years: 30.00    Pack years: 7.50    Types: Cigarettes   Smokeless tobacco: Never  Vaping Use   Vaping Use: Never used  Substance and Sexual Activity   Alcohol use: Yes    Alcohol/week: 1.0 standard drink    Types: 1 Standard drinks or equivalent per week   Drug use: No   Sexual activity: Yes

## 2020-10-04 NOTE — Addendum Note (Signed)
Addended by: Lendon Collar on: 10/04/2020 10:41 AM   Modules accepted: Orders

## 2020-10-05 ENCOUNTER — Ambulatory Visit (INDEPENDENT_AMBULATORY_CARE_PROVIDER_SITE_OTHER): Payer: BC Managed Care – PPO | Admitting: Nurse Practitioner

## 2020-10-05 ENCOUNTER — Other Ambulatory Visit (INDEPENDENT_AMBULATORY_CARE_PROVIDER_SITE_OTHER): Payer: BC Managed Care – PPO

## 2020-10-05 ENCOUNTER — Encounter: Payer: Self-pay | Admitting: Nurse Practitioner

## 2020-10-05 VITALS — BP 138/78 | HR 84 | Ht 71.0 in | Wt 308.6 lb

## 2020-10-05 DIAGNOSIS — K625 Hemorrhage of anus and rectum: Secondary | ICD-10-CM | POA: Diagnosis not present

## 2020-10-05 DIAGNOSIS — K649 Unspecified hemorrhoids: Secondary | ICD-10-CM

## 2020-10-05 LAB — CBC
HCT: 45.7 % (ref 39.0–52.0)
Hemoglobin: 15.1 g/dL (ref 13.0–17.0)
MCHC: 33 g/dL (ref 30.0–36.0)
MCV: 94.3 fl (ref 78.0–100.0)
Platelets: 297 10*3/uL (ref 150.0–400.0)
RBC: 4.84 Mil/uL (ref 4.22–5.81)
RDW: 13.9 % (ref 11.5–15.5)
WBC: 10.4 10*3/uL (ref 4.0–10.5)

## 2020-10-05 MED ORDER — SUTAB 1479-225-188 MG PO TABS: 1.0000 | ORAL_TABLET | ORAL | 0 refills | Status: DC

## 2020-10-05 MED ORDER — HYDROCORTISONE (PERIANAL) 2.5 % EX CREA: 1.0000 "application " | TOPICAL_CREAM | Freq: Every evening | CUTANEOUS | 0 refills | Status: AC

## 2020-10-05 NOTE — Patient Instructions (Addendum)
LABS:  Lab work has been ordered for you today. Our lab is located in the basement. Press "B" on the elevator. The lab is located at the first door on the left as you exit the elevator.  HEALTHCARE LAWS AND MY CHART RESULTS: Due to recent changes in healthcare laws, you may see the results of your imaging and laboratory studies on MyChart before your provider has had a chance to review them.   We understand that in some cases there may be results that are confusing or concerning to you. Not all laboratory results come back in the same time frame and the provider may be waiting for multiple results in order to interpret others.  Please give [place] 48 hours in order for your provider to thoroughly review all the results before contacting the office for clarification of your results.   PROCEDURES: You have been scheduled for a colonoscopy. Please follow the written instructions given to you at your visit today. Please pick up your prep supplies at the pharmacy within the next 1-3 days. If you use inhalers (even only as needed), please bring them with you on the day of your procedure.   MEDICATION: We have sent the following medication to your pharmacy for you to pick up at your convenience: hydrocortisone (ANUSOL-HC) 2.5 % rectal cream 15 g 0 10/05/2020 10/15/2020   Sig - Route: Place 1 application rectally at bedtime for 10 days     It was great seeing you today! Thank you for entrusting me with your care and choosing [facility].  Tye Savoy, NP  If you are age 61 or younger, your body mass index should be between 19-25. Your Body mass index is 43.04 kg/m. If this is out of the aformentioned range listed, please consider follow up with your Primary Care Provider.   The [facility] providers would like to encourage you to use [facility] to communicate with providers for non-urgent requests or questions.  Due to long hold times on the telephone, sending your provider a message by [facility]  may be faster and more efficient way to get a response. Please allow 48 business hours for a response.  Please remember that this is for non-urgent requests/questions.

## 2020-10-05 NOTE — Progress Notes (Signed)
ASSESSMENT AND PLAN    # 60 yo male with one episode of painless rectal bleeding a month ago after taking 1600 mg of ibuprofen for a sinus headache. Suspect bleeding was secondary to internal hemorrhoids but it has been 8 years since his last colonoscopy so need to rule out other etiologies of bleeding such as polyps / neoplasm. Mucosal colored lesion protruding from anal canal on exam. Patient very tense precluding adequate exam though I suspect this is a protruding internal hemorrhoid -- Patient will be scheduled for colonoscopy. The risks and benefits of colonoscopy with possible polypectomy / biopsies were discussed and the patient agrees to proceed.  --Hydrocortisone cream per rectal route Q HS x 10 days   HISTORY OF PRESENT ILLNESS     Chief Complaint : one episode of rectal bleeding  Levi Blankenship is a 60 y.o. male with a past medical history significant for obesity, DM, HTN and sleep apnea. See PMH below for any additional history.   Patient known remotely to Dr. Hilarie Fredrickson from a screening colonoscopy in 2014. He is referred by PCP for evaluation of rectal bleeding.  Approximately 1 month ago patient took 1600 mg of ibuprofen for a sinus headache.  The following day he had a bowel movement containing red blood.  He cannot quantify the amount of blood loss but the toilet water was blood-tinged and he had a small amount of blood on the toilet tissue. Bleeding was not associated with rectal pain nor abdominal pain.  Stool was not hard and he didn't strain with the bowel movements.  Patient has not taken any NSAIDs since (other than his daily aspirin ) and he has had no further rectal bleeding.   No other GI complaints.  Patient does have a history of GERD but is asymptomatic on daily PPI.  Occasionally he does require second dose of omeprazole.   PREVIOUS EVALUATIONS:   January 2014 screening colonoscopy -6 sessile polyps ranging between 3 to 5 mm in size.  Moderate diverticulosis   Polyp pathology-hyperplastic polyps   Past Medical History:  Diagnosis Date   Hypertension    Mild intermittent asthma without complication 15/06/84   New onset type 2 diabetes mellitus ([place]) 12/01/2018   Pre-diabetes    Sleep apnea    not currently using c-pap     Past Surgical History:  Procedure Laterality Date   TONSILLECTOMY     tumor neck     benign and removed   Family History  Problem Relation Age of Onset   Colon cancer Maternal Grandmother    Diabetes Father    Social History   Tobacco Use   Smoking status: Some Days    Packs/day: 0.25    Years: 30.00    Pack years: 7.50    Types: Cigarettes   Smokeless tobacco: Never  Vaping Use   Vaping Use: Never used  Substance Use Topics   Alcohol use: Yes    Alcohol/week: 1.0 standard drink    Types: 1 Standard drinks or equivalent per week   Drug use: No   Current Outpatient Medications  Medication Sig Dispense Refill   albuterol (PROVENTIL HFA;VENTOLIN HFA) 108 (90 Base) MCG/ACT inhaler Inhale 1-2 puffs into the lungs every 6 (six) hours as needed for wheezing or shortness of breath. 1 Inhaler 11   aspirin EC 81 MG tablet Take 81 mg by mouth daily.     atorvastatin (LIPITOR) 40 MG tablet Take by mouth.     cetirizine (  ZYRTEC) 10 MG tablet Take 10 mg by mouth daily.     Cetirizine HCl (ZERVIATE) 0.24 % SOLN Apply 2 drops to eye 2 (two) times daily. 30 each 5   diphenhydrAMINE (BENADRYL) 25 MG tablet Take 25 mg by mouth every 6 (six) hours as needed.     felodipine (PLENDIL) 10 MG 24 hr tablet Take 10 mg by mouth daily.     fluticasone (FLONASE) 50 MCG/ACT nasal spray Place 2 sprays into both nostrils daily. 16 g 5   levocetirizine (XYZAL) 5 MG tablet TAKE 1 TABLET BY MOUTH ONCE DAILY IN THE EVENING. PT NEEDS OV FOR ANY ADDITIONAL REFILLS. 30 tablet 5   lisinopril (PRINIVIL,ZESTRIL) 10 MG tablet Take 10 mg by mouth daily.      loratadine (CLARITIN) 10 MG tablet Take 10 mg by mouth daily.     metFORMIN  (GLUMETZA) 1000 MG (MOD) 24 hr tablet Take 1,000 mg by mouth daily with breakfast.     omeprazole (PRILOSEC) 40 MG capsule Take by mouth.     triamcinolone (NASACORT) 55 MCG/ACT AERO nasal inhaler Place 2 sprays into the nose daily. 1 Bottle 5   No current facility-administered medications for this visit.   No Known Allergies   Review of Systems: All systems reviewed and negative except where noted in HPI.    PHYSICAL EXAM :    Wt Readings from Last 3 Encounters:  10/05/20 (!) 308 lb 9.6 oz (140 kg)  07/02/18 (!) 313 lb 9.6 oz (142.2 kg)  11/02/17 300 lb (136.1 kg)    BP 138/78   Pulse 84   Ht 5\' 11"  (1.803 m)   Wt (!) 308 lb 9.6 oz (140 kg)   SpO2 100%   BMI 43.04 kg/m  Constitutional:  Pleasant male in no acute distress. Psychiatric: Normal mood and affect. Behavior is normal. EENT: Pupils normal.  Conjunctivae are normal. No scleral icterus. Neck supple.  Cardiovascular: Normal rate, regular rhythm. No edema Pulmonary/chest: Effort normal and breath sounds normal. No wheezing, rales or rhonchi. Abdominal: Soft, obese, nondistended, nontender. Bowel sounds active throughout. There are no masses palpable.  Rectal : Difficult exam, patient tense and with muscular buttocks. There is what appears to be a protruding internal hemorrhoid.   Neurological: Alert and oriented to person place and time. Skin: Skin is warm and dry. No rashes noted.  Tye Savoy, NP  10/05/2020, 10:23 AM  Cc:  Referring Provider Venida Jarvis, MD

## 2020-10-10 ENCOUNTER — Encounter: Payer: Self-pay | Admitting: Gastroenterology

## 2020-10-10 ENCOUNTER — Ambulatory Visit (AMBULATORY_SURGERY_CENTER): Payer: BC Managed Care – PPO | Admitting: Gastroenterology

## 2020-10-10 ENCOUNTER — Other Ambulatory Visit: Payer: Self-pay

## 2020-10-10 VITALS — BP 153/91 | HR 88 | Temp 97.6°F | Resp 21

## 2020-10-10 DIAGNOSIS — D124 Benign neoplasm of descending colon: Secondary | ICD-10-CM

## 2020-10-10 DIAGNOSIS — D128 Benign neoplasm of rectum: Secondary | ICD-10-CM

## 2020-10-10 DIAGNOSIS — D122 Benign neoplasm of ascending colon: Secondary | ICD-10-CM

## 2020-10-10 DIAGNOSIS — D123 Benign neoplasm of transverse colon: Secondary | ICD-10-CM

## 2020-10-10 DIAGNOSIS — K649 Unspecified hemorrhoids: Secondary | ICD-10-CM

## 2020-10-10 DIAGNOSIS — K5730 Diverticulosis of large intestine without perforation or abscess without bleeding: Secondary | ICD-10-CM | POA: Diagnosis not present

## 2020-10-10 DIAGNOSIS — D125 Benign neoplasm of sigmoid colon: Secondary | ICD-10-CM | POA: Diagnosis not present

## 2020-10-10 DIAGNOSIS — K621 Rectal polyp: Secondary | ICD-10-CM

## 2020-10-10 DIAGNOSIS — K625 Hemorrhage of anus and rectum: Secondary | ICD-10-CM

## 2020-10-10 HISTORY — PX: OTHER SURGICAL HISTORY: SHX169

## 2020-10-10 MED ORDER — SODIUM CHLORIDE 0.9 % IV SOLN: 500.0000 mL | Freq: Once | INTRAVENOUS | Status: DC

## 2020-10-10 NOTE — Op Note (Signed)
Levi Blankenship: Levi Blankenship Procedure Date: 10/10/2020 8:01 AM MRN: [id number] Endoscopist: Gerrit Heck , MD Age: 60 Referring MD:  Date of Birth: 1960-09-18 Gender: Male Account #: [id number] Procedure:                Colonoscopy Indications:              60 yo male with recent episode of painless                            hematochezia                           - Colonoscopy in 2014 with 6 Hyperplastic polyps                            and diverticulosis. Medicines:                Monitored Anesthesia Care Procedure:                Pre-Anesthesia Assessment:                           - Prior to the procedure, a History and Physical                            was performed, and patient medications and                            allergies were reviewed. The patient's tolerance of                            previous anesthesia was also reviewed. The risks                            and benefits of the procedure and the sedation                            options and risks were discussed with the patient.                            All questions were answered, and informed consent                            was obtained. Prior Anticoagulants: The patient has                            taken no previous anticoagulant or antiplatelet                            agents. ASA Grade Assessment: II - A patient with                            mild systemic disease. After reviewing the risks  and benefits, the patient was deemed in                            satisfactory condition to undergo the procedure.                           After obtaining informed consent, the colonoscope                            was passed under direct vision. Throughout the                            procedure, the patient's blood pressure, pulse, and                            oxygen saturations were monitored continuously. The                             Colonoscope was introduced through the anus and                            advanced to the the cecum, identified by                            appendiceal orifice and ileocecal valve. The                            colonoscopy was performed without difficulty. The                            patient tolerated the procedure well. The quality                            of the bowel preparation was good. The ileocecal                            valve, appendiceal orifice, and rectum were                            photographed. Scope In: 8:08:41 AM Scope Out: 8:39:01 AM Scope Withdrawal Time: 0 hours 25 minutes 22 seconds  Total Procedure Duration: 0 hours 30 minutes 20 seconds  Findings:                 External hemorrhoids were found on perianal exam.                            There was a small polypoid lesion in the anal                            canal. This was located distal to the dentate line,                            and therefore not biopsied in favor of referral to  Colorectal Surgery.                           11 sessile polyps were found in the sigmoid colon                            (4), descending colon (2), transverse colon (2) and                            ascending colon (3). The polyps were 2 to 6 mm in                            size. These polyps were removed with a cold snare.                            Resection and retrieval were complete. Estimated                            blood loss was minimal.                           A 12 mm polyp was found in the sigmoid colon. The                            polyp was pedunculated. The polyp was removed with                            a hot snare. Resection and retrieval were complete.                           Many sessile polyps were found in the rectum,                            recto-sigmoid colon, and distal sigmoid colon. The                            polyps were 1 to 3 mm in size. Ten of  these polyps                            were removed with a cold snare for histologic                            representative evaluation. Resection and retrieval                            were complete. Estimated blood loss was minimal.                            Estimated blood loss was minimal.                           There was a lipoma, in the ascending colon.  Multiple small and large-mouthed diverticula were                            found in the sigmoid colon and ascending colon.                           The retroflexed view of the distal rectum and anal                            verge was normal and showed no anal or rectal                            abnormalities. Complications:            No immediate complications. Estimated Blood Loss:     Estimated blood loss was minimal. Impression:               - Hemorrhoids found on perianal exam.                           - 11 2 to 6 mm polyps in the sigmoid colon, in the                            descending colon, in the transverse colon and in                            the ascending colon, removed with a cold snare.                            Resected and retrieved.                           - One 12 mm polyp in the sigmoid colon, removed                            with a hot snare. Resected and retrieved.                           - Many 1 to 3 mm polyps in the rectum and in the                            distal sigmoid colon, removed with a cold snare.                            Resected and retrieved.                           - Lipoma in the ascending colon.                           - Diverticulosis in the sigmoid colon and in the                            ascending colon.                           -  The distal rectum and anal verge are normal on                            retroflexion view. Recommendation:           - Patient has a contact number available for                             emergencies. The signs and symptoms of potential                            delayed complications were discussed with the                            patient. Return to normal activities tomorrow.                            Written discharge instructions were provided to the                            patient.                           - Resume previous diet.                           - Continue present medications.                           - Await pathology results.                           - Repeat colonoscopy for surveillance based on                            pathology results.                           - Return to GI office PRN.                           - Refer to a colo-rectal surgeon at appointment to                            be scheduled. Gerrit Heck, MD 10/10/2020 8:50:16 AM

## 2020-10-10 NOTE — Progress Notes (Signed)
To PACU, VSS. report to [email or web address].tb

## 2020-10-10 NOTE — Progress Notes (Signed)
Vitals-CW  History reviewed. 

## 2020-10-10 NOTE — Patient Instructions (Addendum)
Handouts given on polyps, diverticulosis and hemorrhoids.  YOU HAD AN ENDOSCOPIC PROCEDURE TODAY AT [facility]:   Refer to the procedure report that was given to you for any specific questions about what was found during the examination.  If the procedure report does not answer your questions, please call your gastroenterologist to clarify.  If you requested that your care partner not be given the details of your procedure findings, then the procedure report has been included in a sealed envelope for you to review at your convenience later.  YOU SHOULD EXPECT: Some feelings of bloating in the abdomen. Passage of more gas than usual.  Walking can help get rid of the air that was put into your GI tract during the procedure and reduce the bloating. If you had a lower endoscopy (such as a colonoscopy or flexible sigmoidoscopy) you may notice spotting of blood in your stool or on the toilet paper. If you underwent a bowel prep for your procedure, you may not have a normal bowel movement for a few days.  Please Note:  You might notice some irritation and congestion in your nose or some drainage.  This is from the oxygen used during your procedure.  There is no need for concern and it should clear up in a day or so.  SYMPTOMS TO REPORT IMMEDIATELY:  Following lower endoscopy (colonoscopy or flexible sigmoidoscopy):  Excessive amounts of blood in the stool  Significant tenderness or worsening of abdominal pains  Swelling of the abdomen that is new, acute  Fever of 100F or higher   For urgent or emergent issues, a gastroenterologist can be reached at any hour by calling [phone]. Do not use MyChart messaging for urgent concerns.    DIET:  We do recommend a small meal at first, but then you may proceed to your regular diet.  Drink plenty of fluids but you should avoid alcoholic beverages for 24 hours.  ACTIVITY:  You should plan to take it easy for the rest of today and you  should NOT DRIVE or use heavy machinery until tomorrow (because of the sedation medicines used during the test).    FOLLOW UP: Our staff will call the number listed on your records 48-72 hours following your procedure to check on you and address any questions or concerns that you may have regarding the information given to you following your procedure. If we do not reach you, we will leave a message.  We will attempt to reach you two times.  During this call, we will ask if you have developed any symptoms of COVID 19. If you develop any symptoms (ie: fever, flu-like symptoms, shortness of breath, cough etc.) before then, please call [phone].  If you test positive for Covid 19 in the 2 weeks post procedure, please call and report this information to [place].    If any biopsies were taken you will be contacted by phone or by letter within the next 1-3 weeks.  Please call us at [phone] if you have not heard about the biopsies in 3 weeks.    SIGNATURES/CONFIDENTIALITY: You and/or your care partner have signed paperwork which will be entered into your electronic medical record.  These signatures attest to the fact that that the information above on your After Visit Summary has been reviewed and is understood.  Full responsibility of the confidentiality of this discharge information lies with you and/or your care-partner.

## 2020-10-10 NOTE — Progress Notes (Deleted)
To PACU, VSS. Report to [email or web address].tb 

## 2020-10-11 ENCOUNTER — Telehealth: Payer: Self-pay

## 2020-10-11 ENCOUNTER — Encounter: Payer: BC Managed Care – PPO | Admitting: Internal Medicine

## 2020-10-11 NOTE — Telephone Encounter (Signed)
Per the procedure report the pt needs referral to [facility] for abnormal colon-hemorrhoids-? anal AIN. Referral has been made and records faxed via Epic

## 2020-10-12 ENCOUNTER — Telehealth: Payer: Self-pay

## 2020-10-12 NOTE — Telephone Encounter (Signed)
  Follow up Call-  Call back number 10/10/2020  Post procedure Call Back phone  # [phone]  Permission to leave phone message Yes  Some recent data might be hidden     Patient questions:  Do you have a fever, pain , or abdominal swelling? No. Pain Score  0 *  Have you tolerated food without any problems? Yes.    Have you been able to return to your normal activities? Yes.    Do you have any questions about your discharge instructions: Diet   No. Medications  No. Follow up visit  No.  Do you have questions or concerns about your Care? No.  Actions: * If pain score is 4 or above: No action needed, pain <4. Have you developed a fever since your procedure? no  2.   Have you had an respiratory symptoms (SOB or cough) since your procedure? no  3.   Have you tested positive for COVID 19 since your procedure no  4.   Have you had any family members/close contacts diagnosed with the COVID 19 since your procedure?  no   If yes to any of these questions please route to Joylene John, RN and Joella Prince, RN

## 2020-10-13 NOTE — Progress Notes (Signed)
Addendum: Reviewed and agree with assessment and management plan. Caroleen Stoermer M, MD  

## 2020-10-16 ENCOUNTER — Telehealth: Payer: Self-pay | Admitting: Gastroenterology

## 2020-10-16 NOTE — Telephone Encounter (Signed)
Inbound call from patient requesting results.

## 2020-10-16 NOTE — Telephone Encounter (Signed)
The pt has been advised that as soon as we get the results we will contact him by phone, My Chart or letter.  The pt has been advised of the information and verbalized understanding.

## 2020-10-19 ENCOUNTER — Other Ambulatory Visit: Payer: Self-pay

## 2020-10-19 ENCOUNTER — Ambulatory Visit
Admission: RE | Admit: 2020-10-19 | Discharge: 2020-10-19 | Disposition: A | Discharge status: Home / Self Care | Payer: BC Managed Care – PPO | Source: Ambulatory Visit | Attending: Orthopaedic Surgery | Admitting: Orthopaedic Surgery

## 2020-10-19 DIAGNOSIS — M25512 Pain in left shoulder: Secondary | ICD-10-CM

## 2020-10-19 DIAGNOSIS — G8929 Other chronic pain: Secondary | ICD-10-CM

## 2020-10-19 MED ORDER — IOPAMIDOL (ISOVUE-M 200) INJECTION 41%
17.0000 mL | Freq: Once | INTRAMUSCULAR | Status: AC
  Administered 2020-10-19: 17 mL via INTRA_ARTICULAR

## 2020-10-19 NOTE — Progress Notes (Signed)
Successful Arthrogram but patient was unable to tolerate MRI. Patient did take Valium prior to Arthrogram.

## 2020-10-20 ENCOUNTER — Encounter: Payer: Self-pay | Admitting: Gastroenterology

## 2020-10-20 ENCOUNTER — Other Ambulatory Visit: Payer: Self-pay

## 2020-10-20 ENCOUNTER — Ambulatory Visit: Payer: BC Managed Care – PPO | Admitting: Orthopaedic Surgery

## 2020-10-20 DIAGNOSIS — D369 Benign neoplasm, unspecified site: Secondary | ICD-10-CM

## 2020-10-20 DIAGNOSIS — D124 Benign neoplasm of descending colon: Secondary | ICD-10-CM

## 2020-10-20 DIAGNOSIS — D125 Benign neoplasm of sigmoid colon: Secondary | ICD-10-CM

## 2020-10-20 DIAGNOSIS — D128 Benign neoplasm of rectum: Secondary | ICD-10-CM

## 2020-10-20 DIAGNOSIS — D122 Benign neoplasm of ascending colon: Secondary | ICD-10-CM

## 2020-10-20 DIAGNOSIS — D123 Benign neoplasm of transverse colon: Secondary | ICD-10-CM

## 2020-10-23 ENCOUNTER — Ambulatory Visit (INDEPENDENT_AMBULATORY_CARE_PROVIDER_SITE_OTHER): Payer: BC Managed Care – PPO | Admitting: *Deleted

## 2020-10-23 ENCOUNTER — Other Ambulatory Visit (HOSPITAL_COMMUNITY): Payer: Self-pay | Admitting: Orthopaedic Surgery

## 2020-10-23 DIAGNOSIS — J309 Allergic rhinitis, unspecified: Secondary | ICD-10-CM | POA: Diagnosis not present

## 2020-10-23 DIAGNOSIS — G8929 Other chronic pain: Secondary | ICD-10-CM

## 2020-10-23 DIAGNOSIS — M25512 Pain in left shoulder: Secondary | ICD-10-CM

## 2020-10-24 ENCOUNTER — Ambulatory Visit: Payer: BC Managed Care – PPO | Admitting: Orthopaedic Surgery

## 2020-11-01 ENCOUNTER — Telehealth: Payer: Self-pay | Admitting: Genetic Counselor

## 2020-11-01 NOTE — Telephone Encounter (Signed)
Received a genetic counseling referral from [facility]. Levi Blankenship has been cld and scheduled to see Cari on 9/1 at 10am. Pt asked me to call his wife with the appt date and time. I cld Mrs. Humber and lft the appt date and time on her vm. Letter mailed.

## 2020-11-07 ENCOUNTER — Ambulatory Visit (HOSPITAL_COMMUNITY): Admission: RE | Admit: 2020-11-07 | Payer: BC Managed Care – PPO | Source: Ambulatory Visit

## 2020-11-07 ENCOUNTER — Ambulatory Visit (HOSPITAL_COMMUNITY): Payer: BC Managed Care – PPO

## 2020-11-07 DIAGNOSIS — J3081 Allergic rhinitis due to animal (cat) (dog) hair and dander: Secondary | ICD-10-CM

## 2020-11-07 NOTE — Progress Notes (Signed)
VIALS MADE. EXP 11-07-21 

## 2020-11-08 ENCOUNTER — Ambulatory Visit: Payer: BC Managed Care – PPO | Admitting: Orthopaedic Surgery

## 2020-11-13 ENCOUNTER — Ambulatory Visit (INDEPENDENT_AMBULATORY_CARE_PROVIDER_SITE_OTHER): Payer: BC Managed Care – PPO

## 2020-11-13 ENCOUNTER — Ambulatory Visit: Payer: Self-pay | Admitting: Surgery

## 2020-11-13 DIAGNOSIS — K429 Umbilical hernia without obstruction or gangrene: Secondary | ICD-10-CM | POA: Insufficient documentation

## 2020-11-13 DIAGNOSIS — K643 Fourth degree hemorrhoids: Secondary | ICD-10-CM | POA: Insufficient documentation

## 2020-11-13 DIAGNOSIS — K6289 Other specified diseases of anus and rectum: Secondary | ICD-10-CM | POA: Insufficient documentation

## 2020-11-13 DIAGNOSIS — Z860101 Personal history of adenomatous and serrated colon polyps: Secondary | ICD-10-CM | POA: Insufficient documentation

## 2020-11-13 DIAGNOSIS — M6208 Separation of muscle (nontraumatic), other site: Secondary | ICD-10-CM | POA: Insufficient documentation

## 2020-11-13 DIAGNOSIS — J309 Allergic rhinitis, unspecified: Secondary | ICD-10-CM | POA: Diagnosis not present

## 2020-11-13 DIAGNOSIS — K625 Hemorrhage of anus and rectum: Secondary | ICD-10-CM | POA: Insufficient documentation

## 2020-11-13 DIAGNOSIS — Z86010 Personal history of colonic polyps: Secondary | ICD-10-CM | POA: Insufficient documentation

## 2020-11-16 ENCOUNTER — Other Ambulatory Visit: Payer: BC Managed Care – PPO

## 2020-11-16 ENCOUNTER — Other Ambulatory Visit: Payer: Self-pay

## 2020-11-16 ENCOUNTER — Inpatient Hospital Stay: Payer: BC Managed Care – PPO | Attending: Genetic Counselor | Admitting: Genetic Counselor

## 2020-11-16 ENCOUNTER — Encounter: Payer: BC Managed Care – PPO | Admitting: Genetic Counselor

## 2020-11-16 ENCOUNTER — Telehealth: Payer: Self-pay | Admitting: Genetic Counselor

## 2020-11-16 ENCOUNTER — Inpatient Hospital Stay: Payer: BC Managed Care – PPO

## 2020-11-16 DIAGNOSIS — Z800 Family history of malignant neoplasm of digestive organs: Secondary | ICD-10-CM

## 2020-11-16 DIAGNOSIS — D369 Benign neoplasm, unspecified site: Secondary | ICD-10-CM | POA: Diagnosis not present

## 2020-11-16 LAB — GENETIC SCREENING ORDER

## 2020-11-16 NOTE — Progress Notes (Signed)
REFERRING PROVIDER: Lavena Bullion, DO [place], [place] [address]  PRIMARY PROVIDER:  Venida Jarvis, MD  PRIMARY REASON FOR VISIT:  Encounter Diagnoses  Name Primary?   Multiple adenomatous polyps Yes   Family history of colon cancer     HISTORY OF PRESENT ILLNESS:   Mr. Levi Blankenship, a 60 y.o. male, was seen for a [facility] cancer genetics consultation at the request of Dr. Bryan Lemma due to a personal history of >10 tubular adenomas (12 total) noted on a recent colonoscopy.  Mr. Toole presents to clinic today to discuss the possibility of a hereditary predisposition to cancer/colon polyps, to discuss genetic testing, and to further clarify his future cancer/polyp risks, as well as potential cancer/polyp risks for family members.   CANCER HISTORY:  Oncology History   No history exists.   RISK FACTORS:  Mr. Majid has a personal history of >10 tubular adenomas (12 total) noted on a recent colonoscopy. He has had 2 colonoscopies to date. One was 09/2020 in which 12 tubular adenomas were identified and one was 03/2012 in which 6 hyperplastic polyps were identified. He reports no history of elevated PSA .    Past Medical History:  Diagnosis Date   Hypertension    Mild intermittent asthma without complication [id number]   New onset type 2 diabetes mellitus ([place]) 12/01/2018   Pre-diabetes    Sleep apnea    not currently using c-pap    Past Surgical History:  Procedure Laterality Date   TONSILLECTOMY     tumor neck     benign and removed    Social History   Socioeconomic History   Marital status: Married    Spouse name: Not on file   Number of children: Not on file   Years of education: Not on file   Highest education level: Not on file  Occupational History   Not on file  Tobacco Use   Smoking status: Some Days    Packs/day: 0.25    Years: 30.00    Pack years: 7.50    Types: Cigarettes   Smokeless tobacco: Never  Vaping Use   Vaping Use: Never used   Substance and Sexual Activity   Alcohol use: Yes    Alcohol/week: 1.0 standard drink    Types: 1 Standard drinks or equivalent per week   Drug use: No   Sexual activity: Yes  Other Topics Concern   Not on file  Social History Narrative   Not on file   Social Determinants of Health   Financial Resource Strain: Not on file  Food Insecurity: Not on file  Transportation Needs: Not on file  Physical Activity: Not on file  Stress: Not on file  Social Connections: Not on file     FAMILY HISTORY:  We obtained a detailed, 4-generation family history.  Significant diagnoses are listed below:  Family History  Problem Relation Age of Onset   Diabetes Father    Heart disease Brother    Colon cancer Maternal Grandmother        dx. 31s   Esophageal cancer Maternal Grandfather 86   Prostate cancer Maternal Grandfather 86   Stomach cancer Neg Hx      Mr. Slicer is unaware of previous family history of genetic testing for hereditary cancer risks. There no reported Ashkenazi Jewish ancestry.   Mr. Olivos maternal grandmother was diagnosed with colon cancer in her 17s, she is deceased. His maternal grandfather was diagnosed with esophageal cancer and prostate cancer at  55, he is deceased. His paternal grandmother was diagnosed with an unknown type of cancer (the cancer was on her leg) in her 61s, she is deceased. His paternal grandfather was diagnosed with an unknown type of cancer at an unknown age, he is deceased.      GENETIC COUNSELING ASSESSMENT: Mr. Menninger is a 60 y.o. male with a personal history of 12 tubular adenomas which is somewhat suggestive of a hereditary predisposition to colon polyps/cancer given >10 tubular adenomas and a family history of colon cancer. We, therefore, discussed and recommended the following at today's visit.   DISCUSSION: We discussed that 5 - 10% of cancer is hereditary, with most cases of cancer/colon polyps are associated with the APC or MUTYH genes.  There are other genes that can be associated with hereditary cancer syndromes. We discussed that testing is beneficial for several reasons including knowing how to follow individuals after completing their treatment and understanding if other family members could be at risk for cancer and allowing them to undergo genetic testing.   We reviewed the characteristics, features and inheritance patterns of hereditary cancer syndromes. We also discussed genetic testing, including the appropriate family members to test, the process of testing, insurance coverage and turn-around-time for results. We discussed the implications of a negative, positive, carrier and/or variant of uncertain significant result. We recommended Mr. Pembleton pursue genetic testing for a panel that includes genes associated with hereditary colon polyps/cancer.   Based on Mr. Tkachenko personal and family history of colon polyps/cancer, he meets medical criteria for genetic testing. Despite that he meets criteria, he may still have an out of pocket cost. Mr. Pellicano requested a benefits investigation before proceeding with testing.   PLAN: We emailed [facility] for a benefits investigation. Mr. Ryland estimated out of pocket cost is $1000. However, the lab offers a $250 self pay option.   Per Mr. Foerst request, I discussed the $250 self pay option with his with his wife. She said she will discuss this with Mr. Fracasso and reach back out to our office today or next week with their decision on whether or not to proceed with genetic testing.   Mr. Lettman questions were answered to his satisfaction today. Our contact information was provided should additional questions or concerns arise. Thank you for the referral and allowing [place] to share in the care of your patient.   Lew Dawes, MS, [facility] Genetic Counselor [email or web address] (P) [phone]  The patient was seen for a total of 30 minutes in face-to-face genetic  counseling. The patient was seen alone.  Drs. Magrinat, Lindi Adie and/or Burr Medico were available to discuss this case as needed.  _______________________________________________________________________ For Office Staff:  Number of people involved in session: 1 Was an Intern/ student involved with case: no

## 2020-11-16 NOTE — Telephone Encounter (Signed)
Returned patient message to answer questions about costs associated with labs. Discussed billing policies of laboratories, self-pay options, and availability of patient pay assistance programs.

## 2020-11-17 ENCOUNTER — Encounter: Payer: Self-pay | Admitting: Genetic Counselor

## 2020-11-17 DIAGNOSIS — D369 Benign neoplasm, unspecified site: Secondary | ICD-10-CM | POA: Insufficient documentation

## 2020-11-17 DIAGNOSIS — Z800 Family history of malignant neoplasm of digestive organs: Secondary | ICD-10-CM | POA: Insufficient documentation

## 2020-11-21 ENCOUNTER — Telehealth: Payer: Self-pay | Admitting: Genetic Counselor

## 2020-11-21 NOTE — Telephone Encounter (Signed)
I returned Adrian's wife, Linda's, call regarding genetic testing. She stated that Kailash does not want to pursue the genetic testing after learning that the out of pocket cost would likely be $250. We encourage Eesa to contact us should he ever decide that he would like to continue with genetic testing.   Lew Dawes, MS, [place] Licensed, Insurance risk surveyor

## 2020-11-27 ENCOUNTER — Encounter (HOSPITAL_BASED_OUTPATIENT_CLINIC_OR_DEPARTMENT_OTHER): Payer: Self-pay | Admitting: Surgery

## 2020-11-27 ENCOUNTER — Other Ambulatory Visit: Payer: Self-pay

## 2020-11-27 DIAGNOSIS — R350 Frequency of micturition: Secondary | ICD-10-CM

## 2020-11-27 DIAGNOSIS — F32A Depression, unspecified: Secondary | ICD-10-CM

## 2020-11-27 DIAGNOSIS — F40240 Claustrophobia: Secondary | ICD-10-CM

## 2020-11-27 DIAGNOSIS — Z972 Presence of dental prosthetic device (complete) (partial): Secondary | ICD-10-CM

## 2020-11-27 DIAGNOSIS — F419 Anxiety disorder, unspecified: Secondary | ICD-10-CM

## 2020-11-27 DIAGNOSIS — K648 Other hemorrhoids: Secondary | ICD-10-CM

## 2020-11-27 HISTORY — DX: Other hemorrhoids: K64.8

## 2020-11-27 HISTORY — DX: Anxiety disorder, unspecified: F41.9

## 2020-11-27 HISTORY — DX: Claustrophobia: F40.240

## 2020-11-27 HISTORY — DX: Depression, unspecified: F32.A

## 2020-11-27 HISTORY — DX: Presence of dental prosthetic device (complete) (partial): Z97.2

## 2020-11-27 HISTORY — DX: Frequency of micturition: R35.0

## 2020-11-27 NOTE — Progress Notes (Signed)
Spoke w/ via phone for pre-op interview---PT WIFE LINDA PER PT REQUEST Lab needs dos----   I stat, ekg            Lab results------none COVID test -----patient states asymptomatic no test needed Arrive at -------730 am 11-30-2020 NPO after MN NO Solid Food.  Clear liquids from MN until---630 am Med rec completed Medications to take morning of surgery -----albuterol inhaler prn/bring inhaler, atorvastatin, felodipine Diabetic medication -----none dos Patient instructed no nail polish to be worn day of surgery Patient instructed to bring photo id and insurance card day of surgery Patient aware to have Driver (ride ) / caregiver   wife linda cell [phone]  for 24 hours after surgery  Patient Special Instructions -----pt does not wish to pick up presurgery G2 drink Pre-Op special Istructions -----none Patient verbalized understanding of instructions that were given at this phone interview. Patient denies shortness of breath, chest pain, fever, cough at this phone interview.

## 2020-11-30 ENCOUNTER — Ambulatory Visit (HOSPITAL_BASED_OUTPATIENT_CLINIC_OR_DEPARTMENT_OTHER)
Admission: RE | Admit: 2020-11-30 | Discharge: 2020-11-30 | Disposition: A | Discharge status: Home / Self Care | Payer: BC Managed Care – PPO | Attending: Surgery | Admitting: Surgery

## 2020-11-30 ENCOUNTER — Ambulatory Visit (HOSPITAL_BASED_OUTPATIENT_CLINIC_OR_DEPARTMENT_OTHER): Payer: BC Managed Care – PPO | Admitting: Anesthesiology

## 2020-11-30 ENCOUNTER — Encounter (HOSPITAL_BASED_OUTPATIENT_CLINIC_OR_DEPARTMENT_OTHER)
Admission: RE | Disposition: A | Discharge status: Home / Self Care | Payer: Self-pay | Source: Home / Self Care | Attending: Surgery

## 2020-11-30 ENCOUNTER — Other Ambulatory Visit: Payer: Self-pay

## 2020-11-30 ENCOUNTER — Encounter (HOSPITAL_BASED_OUTPATIENT_CLINIC_OR_DEPARTMENT_OTHER): Payer: Self-pay | Admitting: Surgery

## 2020-11-30 DIAGNOSIS — Z7984 Long term (current) use of oral hypoglycemic drugs: Secondary | ICD-10-CM | POA: Diagnosis not present

## 2020-11-30 DIAGNOSIS — K6282 Dysplasia of anus: Secondary | ICD-10-CM | POA: Diagnosis present

## 2020-11-30 DIAGNOSIS — A630 Anogenital (venereal) warts: Secondary | ICD-10-CM | POA: Diagnosis not present

## 2020-11-30 DIAGNOSIS — Z6841 Body Mass Index (BMI) 40.0 and over, adult: Secondary | ICD-10-CM | POA: Diagnosis not present

## 2020-11-30 DIAGNOSIS — K641 Second degree hemorrhoids: Secondary | ICD-10-CM | POA: Diagnosis not present

## 2020-11-30 DIAGNOSIS — F17210 Nicotine dependence, cigarettes, uncomplicated: Secondary | ICD-10-CM | POA: Insufficient documentation

## 2020-11-30 DIAGNOSIS — Z79899 Other long term (current) drug therapy: Secondary | ICD-10-CM | POA: Insufficient documentation

## 2020-11-30 DIAGNOSIS — Z8616 Personal history of COVID-19: Secondary | ICD-10-CM | POA: Diagnosis not present

## 2020-11-30 DIAGNOSIS — Z7982 Long term (current) use of aspirin: Secondary | ICD-10-CM | POA: Diagnosis not present

## 2020-11-30 DIAGNOSIS — Z86010 Personal history of colonic polyps: Secondary | ICD-10-CM | POA: Diagnosis not present

## 2020-11-30 DIAGNOSIS — E119 Type 2 diabetes mellitus without complications: Secondary | ICD-10-CM | POA: Insufficient documentation

## 2020-11-30 HISTORY — DX: COVID-19: U07.1

## 2020-11-30 HISTORY — DX: Dyspnea, unspecified: R06.00

## 2020-11-30 HISTORY — DX: Unspecified rotator cuff tear or rupture of left shoulder, not specified as traumatic: M75.102

## 2020-11-30 HISTORY — DX: Gastro-esophageal reflux disease without esophagitis: K21.9

## 2020-11-30 HISTORY — DX: Melena: K92.1

## 2020-11-30 HISTORY — PX: EVALUATION UNDER ANESTHESIA WITH HEMORRHOIDECTOMY: SHX5624

## 2020-11-30 LAB — POCT I-STAT, CHEM 8
BUN: 7 mg/dL — ABNORMAL LOW (ref 8–23)
Calcium, Ion: 1.21 mmol/L (ref 1.15–1.40)
Chloride: 97 mmol/L — ABNORMAL LOW (ref 98–111)
Creatinine, Ser: 0.9 mg/dL (ref 0.61–1.24)
Glucose, Bld: 121 mg/dL — ABNORMAL HIGH (ref 70–99)
HCT: 51 % (ref 39.0–52.0)
Hemoglobin: 17.3 g/dL — ABNORMAL HIGH (ref 13.0–17.0)
Potassium: 3.7 mmol/L (ref 3.5–5.1)
Sodium: 140 mmol/L (ref 135–145)
TCO2: 30 mmol/L (ref 22–32)

## 2020-11-30 LAB — GLUCOSE, CAPILLARY: Glucose-Capillary: 153 mg/dL — ABNORMAL HIGH (ref 70–99)

## 2020-11-30 SURGERY — EXAM UNDER ANESTHESIA WITH HEMORRHOIDECTOMY
Anesthesia: General | Site: Rectum

## 2020-11-30 MED ORDER — GLYCOPYRROLATE 0.2 MG/ML IJ SOLN
INTRAMUSCULAR | Status: DC | PRN
  Administered 2020-11-30: .2 mg via INTRAVENOUS

## 2020-11-30 MED ORDER — OXYCODONE HCL 5 MG PO TABS
5.0000 mg | ORAL_TABLET | Freq: Once | ORAL | Status: AC | PRN
  Administered 2020-11-30: 5 mg via ORAL

## 2020-11-30 MED ORDER — OXYCODONE HCL 5 MG PO TABS
ORAL_TABLET | ORAL | Status: AC
  Filled 2020-11-30: qty 1

## 2020-11-30 MED ORDER — MIDAZOLAM HCL 5 MG/5ML IJ SOLN
INTRAMUSCULAR | Status: DC | PRN
  Administered 2020-11-30 (×2): 1 mg via INTRAVENOUS

## 2020-11-30 MED ORDER — ONDANSETRON HCL 4 MG/2ML IJ SOLN
INTRAMUSCULAR | Status: DC | PRN
  Administered 2020-11-30: 4 mg via INTRAVENOUS

## 2020-11-30 MED ORDER — OXYCODONE HCL 5 MG/5ML PO SOLN
5.0000 mg | Freq: Once | ORAL | Status: AC | PRN
Start: 2020-11-30 — End: 2020-11-30

## 2020-11-30 MED ORDER — PROMETHAZINE HCL 25 MG/ML IJ SOLN: 6.2500 mg | INTRAMUSCULAR | Status: DC | PRN

## 2020-11-30 MED ORDER — MIDAZOLAM HCL 2 MG/2ML IJ SOLN
INTRAMUSCULAR | Status: AC
  Filled 2020-11-30: qty 2

## 2020-11-30 MED ORDER — FENTANYL CITRATE (PF) 100 MCG/2ML IJ SOLN
INTRAMUSCULAR | Status: AC
  Filled 2020-11-30: qty 2

## 2020-11-30 MED ORDER — CELECOXIB 200 MG PO CAPS
ORAL_CAPSULE | ORAL | Status: AC
  Filled 2020-11-30: qty 1

## 2020-11-30 MED ORDER — HYDROMORPHONE HCL 1 MG/ML IJ SOLN: 0.2500 mg | INTRAMUSCULAR | Status: DC | PRN

## 2020-11-30 MED ORDER — GABAPENTIN 300 MG PO CAPS
300.0000 mg | ORAL_CAPSULE | ORAL | Status: AC
  Administered 2020-11-30: 300 mg via ORAL

## 2020-11-30 MED ORDER — DEXAMETHASONE SODIUM PHOSPHATE 10 MG/ML IJ SOLN
INTRAMUSCULAR | Status: AC
  Filled 2020-11-30: qty 1

## 2020-11-30 MED ORDER — PROPOFOL 10 MG/ML IV BOLUS
INTRAVENOUS | Status: DC | PRN
  Administered 2020-11-30: 200 mg via INTRAVENOUS

## 2020-11-30 MED ORDER — EPHEDRINE SULFATE 50 MG/ML IJ SOLN
INTRAMUSCULAR | Status: DC | PRN
  Administered 2020-11-30: 10 mg via INTRAVENOUS

## 2020-11-30 MED ORDER — PHENYLEPHRINE 40 MCG/ML (10ML) SYRINGE FOR IV PUSH (FOR BLOOD PRESSURE SUPPORT)
PREFILLED_SYRINGE | INTRAVENOUS | Status: AC
  Filled 2020-11-30: qty 10

## 2020-11-30 MED ORDER — ROCURONIUM BROMIDE 10 MG/ML (PF) SYRINGE
PREFILLED_SYRINGE | INTRAVENOUS | Status: AC
  Filled 2020-11-30: qty 10

## 2020-11-30 MED ORDER — DEXAMETHASONE SODIUM PHOSPHATE 4 MG/ML IJ SOLN
INTRAMUSCULAR | Status: DC | PRN
  Administered 2020-11-30: 5 mg via INTRAVENOUS

## 2020-11-30 MED ORDER — CHLORHEXIDINE GLUCONATE CLOTH 2 % EX PADS: 6.0000 | MEDICATED_PAD | Freq: Once | CUTANEOUS | Status: DC

## 2020-11-30 MED ORDER — SUGAMMADEX SODIUM 200 MG/2ML IV SOLN
INTRAVENOUS | Status: DC | PRN
  Administered 2020-11-30: 300 mg via INTRAVENOUS
  Administered 2020-11-30: 100 mg via INTRAVENOUS

## 2020-11-30 MED ORDER — DIBUCAINE (PERIANAL) 1 % EX OINT
TOPICAL_OINTMENT | CUTANEOUS | Status: DC | PRN
  Administered 2020-11-30: 1 via RECTAL

## 2020-11-30 MED ORDER — ESMOLOL HCL 100 MG/10ML IV SOLN
INTRAVENOUS | Status: DC | PRN
Start: 2020-11-30 — End: 2020-11-30
  Administered 2020-11-30 (×3): 20 mg via INTRAVENOUS

## 2020-11-30 MED ORDER — PHENYLEPHRINE HCL (PRESSORS) 10 MG/ML IV SOLN
INTRAVENOUS | Status: DC | PRN
  Administered 2020-11-30 (×2): 120 ug via INTRAVENOUS
  Administered 2020-11-30: 80 ug via INTRAVENOUS
  Administered 2020-11-30 (×2): 120 ug via INTRAVENOUS
  Administered 2020-11-30 (×3): 80 ug via INTRAVENOUS

## 2020-11-30 MED ORDER — CEFTRIAXONE SODIUM 2 G IJ SOLR
INTRAMUSCULAR | Status: AC
  Filled 2020-11-30: qty 20

## 2020-11-30 MED ORDER — FENTANYL CITRATE (PF) 100 MCG/2ML IJ SOLN
INTRAMUSCULAR | Status: DC | PRN
  Administered 2020-11-30 (×2): 50 ug via INTRAVENOUS

## 2020-11-30 MED ORDER — PHENYLEPHRINE HCL (PRESSORS) 10 MG/ML IV SOLN: INTRAVENOUS | Status: DC | PRN

## 2020-11-30 MED ORDER — BUPIVACAINE-EPINEPHRINE 0.25% -1:200000 IJ SOLN
INTRAMUSCULAR | Status: DC | PRN
  Administered 2020-11-30: 20 mL

## 2020-11-30 MED ORDER — GABAPENTIN 300 MG PO CAPS
ORAL_CAPSULE | ORAL | Status: AC
  Filled 2020-11-30: qty 1

## 2020-11-30 MED ORDER — ROCURONIUM BROMIDE 100 MG/10ML IV SOLN
INTRAVENOUS | Status: DC | PRN
  Administered 2020-11-30: 60 mg via INTRAVENOUS
  Administered 2020-11-30: 20 mg via INTRAVENOUS

## 2020-11-30 MED ORDER — SODIUM CHLORIDE 0.9 % IV SOLN
INTRAVENOUS | Status: AC
  Filled 2020-11-30: qty 100

## 2020-11-30 MED ORDER — BUPIVACAINE LIPOSOME 1.3 % IJ SUSP
INTRAMUSCULAR | Status: DC | PRN
  Administered 2020-11-30: 20 mL

## 2020-11-30 MED ORDER — PROPOFOL 10 MG/ML IV BOLUS
INTRAVENOUS | Status: AC
  Filled 2020-11-30: qty 20

## 2020-11-30 MED ORDER — EPHEDRINE 5 MG/ML INJ
INTRAVENOUS | Status: AC
  Filled 2020-11-30: qty 5

## 2020-11-30 MED ORDER — ONDANSETRON HCL 4 MG/2ML IJ SOLN
INTRAMUSCULAR | Status: AC
  Filled 2020-11-30: qty 2

## 2020-11-30 MED ORDER — GLYCOPYRROLATE PF 0.2 MG/ML IJ SOSY
PREFILLED_SYRINGE | INTRAMUSCULAR | Status: AC
  Filled 2020-11-30: qty 1

## 2020-11-30 MED ORDER — MEPERIDINE HCL 25 MG/ML IJ SOLN: 6.2500 mg | INTRAMUSCULAR | Status: DC | PRN

## 2020-11-30 MED ORDER — ENSURE PRE-SURGERY PO LIQD: 296.0000 mL | Freq: Once | ORAL | Status: DC

## 2020-11-30 MED ORDER — SODIUM CHLORIDE 0.9 % IV SOLN: INTRAVENOUS | Status: DC

## 2020-11-30 MED ORDER — ESMOLOL HCL 100 MG/10ML IV SOLN
INTRAVENOUS | Status: AC
  Filled 2020-11-30: qty 10

## 2020-11-30 MED ORDER — OXYCODONE HCL 5 MG PO TABS: 5.0000 mg | ORAL_TABLET | Freq: Four times a day (QID) | ORAL | 0 refills | Status: DC | PRN

## 2020-11-30 MED ORDER — CELECOXIB 200 MG PO CAPS
200.0000 mg | ORAL_CAPSULE | ORAL | Status: AC
  Administered 2020-11-30: 200 mg via ORAL

## 2020-11-30 MED ORDER — ACETAMINOPHEN 500 MG PO TABS
ORAL_TABLET | ORAL | Status: AC
  Filled 2020-11-30: qty 2

## 2020-11-30 MED ORDER — ACETAMINOPHEN 500 MG PO TABS
1000.0000 mg | ORAL_TABLET | ORAL | Status: AC
Start: 2020-11-30 — End: 2020-11-30
  Administered 2020-11-30: 1000 mg via ORAL

## 2020-11-30 MED ORDER — LIDOCAINE HCL (PF) 2 % IJ SOLN
INTRAMUSCULAR | Status: AC
  Filled 2020-11-30: qty 5

## 2020-11-30 MED ORDER — LIDOCAINE HCL (CARDIAC) PF 100 MG/5ML IV SOSY
PREFILLED_SYRINGE | INTRAVENOUS | Status: DC | PRN
  Administered 2020-11-30: 100 mg via INTRAVENOUS

## 2020-11-30 MED ORDER — BUPIVACAINE LIPOSOME 1.3 % IJ SUSP: 20.0000 mL | Freq: Once | INTRAMUSCULAR | Status: DC

## 2020-11-30 MED ORDER — 0.9 % SODIUM CHLORIDE (POUR BTL) OPTIME
TOPICAL | Status: DC | PRN
  Administered 2020-11-30: 500 mL

## 2020-11-30 MED ORDER — SODIUM CHLORIDE 0.9 % IV SOLN
2.0000 g | INTRAVENOUS | Status: AC
  Administered 2020-11-30: 2 g via INTRAVENOUS

## 2020-11-30 SURGICAL SUPPLY — 46 items
COVER BACK TABLE 60X90IN (DRAPES) ×2 IMPLANT
COVER MAYO STAND STRL (DRAPES) ×2 IMPLANT
DRAPE HYSTEROSCOPY (MISCELLANEOUS) ×2 IMPLANT
DRAPE SHEET LG 3/4 BI-LAMINATE (DRAPES) ×2 IMPLANT
DRSG PAD ABDOMINAL 8X10 ST (GAUZE/BANDAGES/DRESSINGS) ×2 IMPLANT
ELECT REM PT RETURN 9FT ADLT (ELECTROSURGICAL) ×2
ELECTRODE REM PT RTRN 9FT ADLT (ELECTROSURGICAL) ×1 IMPLANT
GAUZE 4X4 16PLY ~~LOC~~+RFID DBL (SPONGE) ×2 IMPLANT
GAUZE SPONGE 4X4 12PLY STRL (GAUZE/BANDAGES/DRESSINGS) ×2 IMPLANT
GLOVE SRG 8 PF TXTR STRL LF DI (GLOVE) ×1 IMPLANT
GLOVE SURG LTX SZ8 (GLOVE) ×2 IMPLANT
GLOVE SURG UNDER POLY LF SZ7 (GLOVE) ×6 IMPLANT
GLOVE SURG UNDER POLY LF SZ8 (GLOVE) ×1
GOWN STRL REUS W/TWL LRG LVL3 (GOWN DISPOSABLE) ×2 IMPLANT
GOWN STRL REUS W/TWL XL LVL3 (GOWN DISPOSABLE) ×2 IMPLANT
HIBICLENS CHG 4% 4OZ BTL (MISCELLANEOUS) ×2 IMPLANT
IV CATH PLACEMENT 20 GA (IV SOLUTION) IMPLANT
KIT SIGMOIDOSCOPE (SET/KITS/TRAYS/PACK) IMPLANT
KIT TURNOVER CYSTO (KITS) ×2 IMPLANT
LEGGING LITHOTOMY PAIR STRL (DRAPES) ×2 IMPLANT
MANIFOLD NEPTUNE II (INSTRUMENTS) ×2 IMPLANT
NEEDLE HYPO 22GX1.5 SAFETY (NEEDLE) ×2 IMPLANT
NS IRRIG 500ML POUR BTL (IV SOLUTION) ×2 IMPLANT
PACK BASIN DAY SURGERY FS (CUSTOM PROCEDURE TRAY) ×2 IMPLANT
PAD PREP 24X48 CUFFED NSTRL (MISCELLANEOUS) ×2 IMPLANT
PANTS MESH DISP LRG (UNDERPADS AND DIAPERS) ×1 IMPLANT
PANTS MESH DISPOSABLE L (UNDERPADS AND DIAPERS) ×1
PENCIL SMOKE EVACUATOR (MISCELLANEOUS) ×2 IMPLANT
SHEARS HARMONIC 9CM CVD (BLADE) IMPLANT
SURGILUBE 2OZ TUBE FLIPTOP (MISCELLANEOUS) ×2 IMPLANT
SUT CHROMIC 2 0 SH (SUTURE) ×2 IMPLANT
SUT CHROMIC 3 0 SH 27 (SUTURE) IMPLANT
SUT VIC AB 2-0 SH 27 (SUTURE)
SUT VIC AB 2-0 SH 27XBRD (SUTURE) IMPLANT
SUT VIC AB 2-0 UR6 27 (SUTURE) ×12 IMPLANT
SUT VICRYL 0 UR6 27IN ABS (SUTURE) IMPLANT
SUT VICRYL AB 2 0 TIE (SUTURE) IMPLANT
SUT VICRYL AB 2 0 TIES (SUTURE)
SYR 20ML LL LF (SYRINGE) ×2 IMPLANT
SYR BULB IRRIG 60ML STRL (SYRINGE) ×2 IMPLANT
SYR CONTROL 10ML LL (SYRINGE) ×2 IMPLANT
TOWEL OR 17X26 10 PK STRL BLUE (TOWEL DISPOSABLE) ×2 IMPLANT
TRAY DSU PREP LF (CUSTOM PROCEDURE TRAY) ×2 IMPLANT
TUBE CONNECTING 12X1/4 (SUCTIONS) ×2 IMPLANT
UNDERPAD 30X36 HEAVY ABSORB (UNDERPADS AND DIAPERS) ×2 IMPLANT
YANKAUER SUCT BULB TIP NO VENT (SUCTIONS) ×2 IMPLANT

## 2020-11-30 NOTE — Anesthesia Postprocedure Evaluation (Signed)
Anesthesia Post Note  Patient: Levi Blankenship  Procedure(s) Performed: EXCISION OF PROLAPSING ANAL MASS, HEMORRHOIDECTOMY, HEMORRHOIDOPEXY, ANORECTAL EXAMINATION UNDER ANESTHESIA (Rectum)     Patient location during evaluation: PACU Anesthesia Type: General Level of consciousness: sedated and patient cooperative Pain management: pain level controlled Vital Signs Assessment: post-procedure vital signs reviewed and stable Respiratory status: spontaneous breathing Cardiovascular status: stable Anesthetic complications: no   No notable events documented.  Last Vitals:  Vitals:   11/30/20 1200 11/30/20 1215  BP: (!) 141/88   Pulse: (!) 101   Resp: 20   Temp:  36.6 C  SpO2: 100% 96%    Last Pain:  Vitals:   11/30/20 1215  TempSrc:   PainSc: [place]

## 2020-11-30 NOTE — Op Note (Signed)
11/30/2020  11:20 AM  PATIENT:  Levi Blankenship  60 y.o. male  Patient Care Team: Venida Jarvis, MD as PCP - General (Family Medicine) Ernst Bowler Gwenith Daily, MD as Consulting Physician (Allergy and Immunology) Michael Boston, MD as Consulting Physician (General Surgery) Leandrew Koyanagi, MD as Attending Physician (Orthopedic Surgery) Pyrtle, Lajuan Lines, MD as Consulting Physician (Gastroenterology)  PRE-OPERATIVE DIAGNOSIS:   [place] WITH BLEEDING  POST-OPERATIVE DIAGNOSIS:   [place] GRADE 2 WITH BLEEDING  PROCEDURE:  EXCISION OF PROLAPSING ANAL MASS Internal and external hemorrhoidectomy x1 Internal hemorrhoidal ligation and pexy Anorectal examination under anesthesia  SURGEON:  Adin Hector, MD  ANESTHESIA:   General Anorectal & Local field block (0.25% bupivacaine with epinephrine mixed with Liposomal bupivacaine (Experel)   EBL:  No intake/output data recorded..  See operative record  Delay start of Pharmacological VTE agent (>24hrs) due to surgical blood loss or risk of bleeding:  NO  DRAINS: NONE  SPECIMEN:  PROLAPSING ANORECTAL MASS Internal & external hemorrhoid x1  DISPOSITION OF SPECIMEN:  PATHOLOGY  COUNTS:  YES  PLAN OF CARE: Discharge home after PACU  PATIENT DISPOSITION:  PACU - hemodynamically stable.  INDICATION: Pleasant patient with prolapsed anal rectal mass.  Probable hemorrhoid but possible adenomatous polyp.  Bleeding and discomfort.  I recommended examination under anesthesia and surgical treatment:  The anatomy & physiology of the anorectal region was discussed.  The pathophysiology of hemorrhoids and differential diagnosis was discussed.  Natural history risks without surgery was discussed.   I stressed the importance of a bowel regimen to have daily soft bowel movements to minimize progression of disease.  Interventions such as sclerotherapy & banding were  discussed.  The patient's symptoms are not adequately controlled by medicines and other non-operative treatments.  I feel the risks & problems of no surgery outweigh the operative risks; therefore, I recommended surgery to treat the hemorrhoids by ligation, pexy, and possible resection.  Risks such as bleeding, infection, need for further treatment, heart attack, death, and other risks were discussed.   I noted a good likelihood this will help address the problem.  Goals of post-operative recovery were discussed as well.  Possibility that this will not correct all symptoms was explained.  Post-operative pain, bleeding, constipation, urinary difficulties, and other problems after surgery were discussed.  We will work to minimize complications.   Educational handouts further explaining the pathology, treatment options, and bowel regimen were given as well.  Questions were answered.  The patient expresses understanding & wishes to proceed with surgery.  OR FINDINGS: Right posterior anorectal prolapsing mass.  Most likely enlarged atypical hemorrhoid versus adenomatous polyp.  Excised closure.  Left lateral grade 2/3 hemorrhoid.  Pexy and hemorrhoidectomy done.  Right anterior grade 2 pile.  Ligation/pexy done.  No fissure, fistula.  No obvious condyloma or anal wart or cancer.  No pilonidal disease.  No hidradenitis.  DESCRIPTION:   Informed consent was confirmed. Patient underwent general anesthesia without difficulty. Patient was placed into prone positioning.  The perianal region was prepped and draped in sterile fashion. Surgical time-out confirmed our plan.  I did digital rectal examination and then transitioned over to anoscopy to get a sense of the anatomy.  Findings noted above.   I proceeded to excise the obvious prolapsing mass in the right posterior region involving the right posterior hemorrhoid.  I used a 2-0 Vicryl suture on a UR-6 needle in a figure-of-eight fashion 6 cm proximal to  the anal verge.  I ran that down more distally.  I excised the prolapsing mass longitudinally in a fusiform biconcave fashion, sparing the anal canal to avoid narrowing.  I then ran that stitch longitudinally more distally to close the hemorrhoidectomy wound to the anal verge over a large Parks self-retaining anal retractor to avoid narrowing of the anal canal.  I then tied that stitch down to cause a hemorrhoidopexy.   I also had to do an excision at the  left lateral pile locations.  I then did hemorrhoidal ligation and pexy at the other 4 hemorrhoidal columns.  At the completion of this, all 6 anorectal columns were ligated and pexied in the classic hexagonal fashion (right anterior/lateral/posterior, left anterior/lateral/posterior).  I trimmed and closed the external part of the hemorrhoidectomy wounds at the right posterior and left lateral columns with radial interrupted horizontal mattress 2-0 chromic suture over a large Sawyer anal retractor, leaving the last 5 mm open to allow natural drainage.    I redid anoscopy & examination.  At completion of this, all hemorrhoids had been removed or reduced into the rectum.  There is no more prolapse.  Internal & external anatomy was more more normal.  Hemostasis was good.  Fluffed gauze was on-laid over the perianal region.  No packing done.  Patient is being extubated go to go to the recovery room.  I had discussed postop care in detail with the patient in the preop holding area.  Instructions for post-operative recovery and prescriptions are written. I discussed operative findings, updated the patient's status, discussed probable steps to recovery, and gave postoperative recommendations to the patient's spouse, Dreyon Sebring.  Recommendations were made.  Questions were answered.  She expressed understanding & appreciation.  Adin Hector, M.D., F.A.C.S. Gastrointestinal and Minimally Invasive Surgery [facility] [address] [place], [place] [address] [phone] Main / Paging

## 2020-11-30 NOTE — H&P (Signed)
11/30/2020    REFERRING PHYSICIAN: Pyrtle, Frances Maywood, MD  Patient Care Team: Stevphen Meuse, MD as PCP - General (Family Medicine) Johney Maine, Adrian Saran, MD as Consulting Provider (General Surgery) Gerrit Heck, DO (Gastroenterology) Pyrtle, Frances Maywood, MD (Gastroenterology)  PROVIDER: Hollace Kinnier, MD  [facility] MRN: [id number] DOB: [birth year] DATE OF ENCOUNTER: 11/13/2020  Subjective   Chief Complaint: Anal AIN   History of Present Illness: Levi Blankenship is a 60 y.o. male who is seen today as an office consultation at the request of Dr. Hilarie Fredrickson for evaluation of Anal AIN .   Pleasant morbidly obese male. Still smokes a few cigarettes. Some seasonal allergies. Diabetes on oral hypoglycemic. Question of sleep apnea but not on any oxygen. He comes today with his wife. He had a severe headache and took 2 ibuprofens. They were 800 mg. He had some bright red blood with the next bowel movement that concerned him. He is convinced that the ibuprofen did that. Wonder if it is related to his heartburn. Discussed with primary care who sent him to gastroenterology. Colonoscopy done. Numerous adenomatous and hyperplastic polyps removed. 1 year colonoscopy follow-up recommended by gastroenterology.   Mass at the anal verge noted. Seemed atypical. Possible AIN versus atypical hemorrhoid. Surgical consultation offered.  Patient moves his bowels about 3 times a day. He smokes less than half a pack a day. He can walk at least 20 minutes without difficulty. He is no longer had any more bloody bowel movements after he stopped taking the ibuprofen. He is on proton pump inhibitors. He gets some issues lying down flat when he sleeps but is better in a chair. Wife thinks that sleep apnea. He thinks it is heartburn. No cardiac or pulmonary issues that he is aware of. He is on aspirin at the most. He has never had any abdominal surgeries. Recalls having 3 polyps on prior colonoscopy, so having more  than 10 was a surprise to him. Struggles with seasonal allergies and sinus headaches.  Ready for surgery    Medical History: Past Medical History:  Diagnosis Date   Asthma, unspecified asthma severity, unspecified whether complicated, unspecified whether persistent   Diabetes mellitus without complication (CMS-HCC)   GERD (gastroesophageal reflux disease)   History of cancer   Hypertension   Sleep apnea   There is no problem list on file for this patient.  Past Surgical History:  Procedure Laterality Date   Removal of tumor from throat 2010    No Known Allergies  Current Outpatient Medications on File Prior to Visit  Medication Sig Dispense Refill   aspirin 81 MG EC tablet Take 81 mg by mouth once daily   atorvastatin (LIPITOR) 40 MG tablet Take 40 mg by mouth once daily   famotidine (PEPCID) 10 MG tablet Take 10 mg by mouth once daily   lisinopriL (ZESTRIL) 20 MG tablet Take 20 mg by mouth once daily   metFORMIN (GLUMETZA) 1000 MG (MOD) ER tablet Take 1,000 mg by mouth daily with breakfast   omeprazole (PRILOSEC) 40 MG DR capsule   No current facility-administered medications on file prior to visit.   Family History  Problem Relation Age of Onset   Stroke Father   Skin cancer Father   High blood pressure (Hypertension) Father   Hyperlipidemia (Elevated cholesterol) Father   Diabetes Father   Obesity Sister   Diabetes Brother   Colon cancer Maternal Grandmother   Skin cancer Paternal Grandmother    Social History   Tobacco Use  Smoking Status Current Every Day Smoker  Smokeless Tobacco Never Used    Social History   Socioeconomic History   Marital status: Married  Tobacco Use   Smoking status: Current Every Day Smoker   Smokeless tobacco: Never Used  Substance and Sexual Activity   Alcohol use: Yes   Drug use: Yes  Comment: Marijuana   ############################################################  Fraility Risk:  Review of Systems: A complete  review of systems (ROS) was obtained from the patient. I have reviewed this information and discussed as appropriate with the patient. See HPI as well for other pertinent ROS.  Constitutional: No fevers, chills, sweats. Weight stable Eyes: No vision changes, No discharge HENT: No sore throats, nasal drainage Lymph: No neck swelling, No bruising easily Pulmonary: No cough, productive sputum CV: No orthopnea, PND Patient walks 20 minutes for about 1 miles without difficulty. No exertional chest/neck/shoulder/arm pain.  GI: No personal nor family history of GI/colon cancer, inflammatory bowel disease, irritable bowel syndrome, allergy such as Celiac Sprue, dietary/dairy problems, colitis, ulcers nor gastritis. No recent sick contacts/gastroenteritis. No travel outside the country. No changes in diet.  Renal: No UTIs, No hematuria Genital: No drainage, bleeding, masses Musculoskeletal: No severe joint pain. Good ROM major joints Skin: No sores or lesions Heme/Lymph: No easy bleeding. No swollen lymph nodes  Objective:   Vitals:  11/13/20 0842  Pulse: 107  Temp: 36.4 C (97.6 F)  SpO2: 97%  Weight: (!) 139.2 kg (306 lb 12.8 oz)  Height: 177.8 cm ('5\' 10"'$ )    11/30/2020 BP (!) 158/104   Pulse 95   Temp 98.9 F (37.2 C) (Oral)   Resp 18   Ht '5\' 11"'$  (1.803 m)   Wt (!) 138.2 kg   SpO2 97%   BMI 42.50 kg/m    Body mass index is 44.02 kg/m.  PHYSICAL EXAM:  Constitutional: Not cachectic. Hygeine adequate. Vitals signs as above.  Eyes: Pupils reactive, normal extraocular movements. Sclera nonicteric Neuro: CN II-XII intact. No major focal sensory defects. No major motor deficits. Lymph: No head/neck/groin lymphadenopathy Psych: No severe agitation. No severe anxiety. Judgment & insight Adequate, Oriented x4, HENT: Normocephalic, Mucus membranes moist. No thrush.  Neck: Supple, No tracheal deviation. No obvious thyromegaly Chest: No pain to chest wall compression. Good  respiratory excursion. No audible wheezing CV: Pulses intact. Regular rhythm. No major extremity edema  Abdomen: Obese with panniculus Hernia: Present at: umbilicaus 0000000 small. Diastasis recti: Large supraumbilical midline. Soft. Nondistended. Nontender. No hepatomegaly. No splenomegaly  Gen: Inguinal hernia: Not present. Inguinal lymph nodes: without lymphadenopathy.   Rectal: ##################################  Patient examined in decubitus position.  Perianal skin Clean with good hygiene  Pruritis ani: Not present Anal fissure: Not present Perirectal abscess/fistula Not present External hemorrhoids Prolapsed mucosa. Most likely right posterior rectum/hemorrhoidal tissue grade 4 along with tag. Pilonidal disease: Not present Condyloma / warts: Not present  Digital and anoscopic rectal exam Barely tolerated  Sphincter tone Mildly decreased squeeze  Hemorrhoidal piles Grade 4 prolapsed at right posterior -no definite nodularity. Rather soft but sensitive. Friable.. The rest grade 2. Limited exam Rectal masses: As noted above. Probable prolapsed hemorrhoid  ###################################  Ext: No obvious deformity or contracture. Edema: Not present. No cyanosis Skin: No major subcutaneous nodules. Warm and dry Musculoskeletal: Severe joint rigidity not present. No obvious clubbing. No digital petechiae.   Labs, Imaging and Diagnostic Testing:  Located in [place]' section of Epic EMR chart  PRIOR NOTES   Not applicable  SURGERY NOTES:  Not applicable  PATHOLOGY:  Located in [place]' section of Epic EMR chart   Diagnosis 1. Surgical [P], colon, ascending, transverse, descending, sigmoid, polyp (11) - TUBULAR ADENOMA (MULTIPLE FRAGMENTS). - NO HIGH GRADE DYSPLASIA OR MALIGNANCY. 2. Surgical [P], colon, sigmoid, polyp (1) - TUBULAR ADENOMA. - NO HIGH GRADE DYSPLASIA OR MALIGNANCY. 3. Surgical [P], colon, sigmoid, rectal, polyp (10) -  HYPERPLASTIC POLYP (X4 FRAGMENTS). - NO DYSPLASIA OR MALIGNANCY. Vicente Males MD Pathologist, Electronic Signature (Case signed 10/12/2020) Specimen Aryssa Rosamond and Clinical Information Specimen Comment 1. Rectal bleeding; hemorrhoids, unspecified hemorrhoid type; benign neoplasm of ascending colon; benign neoplasm of transverse colon; benign neoplasm of descending colon; benign neoplasm of sigmoid colon; benign neoplasm of rectum Specimen(s) Obtained: 1. Surgical [P], colon, ascending, transverse, descending, sigmoid, polyp (11) 2. Surgical [P], colon, sigmoid, polyp (1) 3. Surgical [P], colon, sigmoid, rectal, polyp (10) Specimen Clinical Information 1. R/O adenoma 2. R/O adenoma 3. R/O adenoma Kunaal Walkins 1. Received in formalin are tan, soft tissue fragments that are submitted in toto. Number: 11, Size: 0.3 cm smallest to 1 of 2 FINAL for Syring, Keron L. [id number]) Calogero Geisen(continued) 1.1 cm largest, (1B) ( TA ) 2. Received in formalin are tan, soft tissue fragments that are submitted in toto. blue Number: 1, Size: 0.6 x 0.6 x 1.1 cm = 4 sec (1B) (TA) 3. Received in formalin are tan, soft tissue fragments that are submitted in toto. only 4pol grossly observed in bottle Number: 4, Size: 0.4 cm smallest to 0.6 cm largest, (1B) ( TA )  Assessment and Plan:  DIAGNOSES:  Diagnoses and all orders for this visit:  Mass of anus  Hx of adenomatous colonic polyps  Prolapsed internal hemorrhoids, grade 4  Bright red rectal bleeding    ASSESSMENT/PLAN  Prolapsed anal mass friable. Feels soft. Suspect this is a chronically irritated grade 4 hemorrhoid.  I recommended outpatient examination anesthesia with excisional biopsy and hemorrhoidal ligation & pexy. Should be an outpatient surgery. Discussed at length risk benefits and alternatives. Patient and his wife wish to be aggressive and proceed. I did caution that he will struggle with postoperative pain. Despite his obesity and respiratory  issues and smoking, he has decent performance status so I think his operative risks are low for this low risk surgery.  The anatomy & physiology of the anorectal region was discussed.  The pathophysiology of hemorrhoids and differential diagnosis was discussed.  Natural history risks without surgery was discussed.   I stressed the importance of a bowel regimen to have daily soft bowel movements to minimize progression of disease.  Interventions such as sclerotherapy & banding were discussed.  The patient's symptoms are not adequately controlled by medicines and other non-operative treatments.  I feel the risks & problems of no surgery outweigh the operative risks; therefore, I recommended surgery to treat the hemorrhoids by ligation, pexy, and possible resection.  Risks such as bleeding, infection, urinary difficulties, injury to other organs, need for repair of tissues / organs, need for further treatment, heart attack, death, and other risks were discussed.   I noted a good likelihood this will help address the problem.  Goals of post-operative recovery were discussed as well.  Possibility that this will not correct all symptoms was explained.  Post-operative pain, bleeding, constipation, and other problems after surgery were discussed.  We will work to minimize complications.   Educational handouts further explaining the pathology, treatment options, and bowel regimen were given as well.  Questions were answered.  The patient expresses  understanding & wishes to proceed with surgery.    FOLLOWUP: No follow-ups on file.  I had direct face-to-face contact with the patient for a total of 45 minutes and greater than 50% of that time was spent providing counseling and/or coordination of care for the patient regarding the above.  ########################################################  Adin Hector, MD, FACS, MASCRS Esophageal, Gastrointestinal & Colorectal Surgery Robotic and Minimally Invasive  Surgery  [facility] [place]  [place], [place], [place] [address] [phone] Fax [phone] Main       Note: Portions of this report may have been transcribed using voice recognition software. Every effort was made to ensure accuracy; however, inadvertent computerized transcription errors may be present. Any transcriptional errors that result from this process are unintentional.

## 2020-11-30 NOTE — Anesthesia Procedure Notes (Signed)
Procedure Name: Intubation Date/Time: 11/30/2020 10:16 AM Performed by: Bufford Spikes, CRNA Pre-anesthesia Checklist: Patient identified, Emergency Drugs available, Suction available and Patient being monitored Patient Re-evaluated:Patient Re-evaluated prior to induction Oxygen Delivery Method: Circle system utilized Preoxygenation: Pre-oxygenation with 100% oxygen Induction Type: IV induction Ventilation: Mask ventilation without difficulty Laryngoscope Size: Miller and 2 Grade View: Grade II Tube type: Oral Tube size: 7.0 mm Number of attempts: 1 Airway Equipment and Method: Stylet and Oral airway Placement Confirmation: ETT inserted through vocal cords under direct vision, positive ETCO2 and breath sounds checked- equal and bilateral Secured at: 21 cm Tube secured with: Tape Dental Injury: Teeth and Oropharynx as per pre-operative assessment

## 2020-11-30 NOTE — Transfer of Care (Signed)
Immediate Anesthesia Transfer of Care Note  Patient: Levi Blankenship  Procedure(s) Performed: EXCISION OF PROLAPSING ANAL MASS, HEMORRHOIDECTOMY, HEMORRHOIDOPEXY, ANORECTAL EXAMINATION UNDER ANESTHESIA (Rectum)  Patient Location: PACU  Anesthesia Type:General  Level of Consciousness: awake, alert  and oriented  Airway & Oxygen Therapy: Patient Spontanous Breathing and Patient connected to nasal cannula oxygen  Post-op Assessment: Report given to RN and Post -op Vital signs reviewed and stable  Post vital signs: Reviewed and stable  Last Vitals:  Vitals Value Taken Time  BP 160/106 11/30/20 1138  Temp 36.8 C 11/30/20 1138  Pulse 103 11/30/20 1142  Resp 21 11/30/20 1142  SpO2 100 % 11/30/20 1142  Vitals shown include unvalidated device data.  Last Pain:  Vitals:   11/30/20 1138  TempSrc:   PainSc: 0-No pain      Patients Stated Pain Goal: 5 ([id number])  Complications: No notable events documented.

## 2020-11-30 NOTE — Interval H&P Note (Signed)
History and Physical Interval Note:  11/30/2020 9:32 AM  Levi Blankenship  has presented today for surgery, with the diagnosis of HEMORRHOIDS PROLAPSED GRADE 4 WITH BLEEDING.  The various methods of treatment have been discussed with the patient and family. After consideration of risks, benefits and other options for treatment, the patient has consented to  Procedure(s) with comments: HEMORRHOIDECTOMY WITH LIGATION AND HEMORRHOIDOPEXY, ANORECTAL EXAMINATION UNDER ANESTHESIA (N/A) - GEN & LOCAL as a surgical intervention.  The patient's history has been reviewed, patient examined, no change in status, stable for surgery.  I have reviewed the patient's chart and labs.  Questions were answered to the patient's satisfaction.    I have re-reviewed the the patient's records, history, medications, and allergies.  I have re-examined the patient.  I again discussed intraoperative plans and goals of post-operative recovery.  The patient agrees to proceed.  Levi Blankenship  12-25-1960 [id number]  Patient Care Team: Venida Jarvis, MD as PCP - General (Family Medicine) Ernst Bowler Gwenith Daily, MD as Consulting Physician (Allergy and Immunology) Michael Boston, MD as Consulting Physician (General Surgery) Leandrew Koyanagi, MD as Attending Physician (Orthopedic Surgery) Jerene Bears, MD as Consulting Physician (Gastroenterology)  Patient Active Problem List   Diagnosis Date Noted   Multiple adenomatous polyps 11/17/2020   Family history of colon cancer 11/17/2020   Mild intermittent asthma without complication [id number]   New onset type 2 diabetes mellitus ([place]) 12/01/2018   Seasonal and perennial allergic rhinitis 07/08/2018   Laryngopharyngeal reflux (LPR) 01/13/2018   Allergy 01/13/2018   Morbid obesity with BMI of 45.0-49.9, adult ([place]) 04/13/2017   OSA (obstructive sleep apnea) 04/13/2017   Primary osteoarthritis involving multiple joints 07/20/2014   Hypertension, benign 03/22/2003    Past  Medical History:  Diagnosis Date   Blood in stool    OVER A MONTH AGO PER PT WIFE ON 11-27-2020   Claustrophobia 11/27/2020   COVID    SPRING 2022 COUGH CHILLS SOB X 4 DAYS ALL SYMPTOMS REOLVED   Dyspnea    OCC DUE TO ACID REFLUX   GERD (gastroesophageal reflux disease)    Hypertension    MILD ANXIETY 11/27/2020   NO MEDS TAKEN   MILD DEPRESSION 11/27/2020   NO MEDS TAKEN   Mild intermittent asthma without complication [id number]   New onset type 2 diabetes mellitus ([place]) 12/01/2018   Prolapsed hemorrhoids 11/27/2020   Rotator cuff tear, left    DONE SEVERAL MNTHS AGO, PER PT WIFE ON 11-27-2020 HAS PAIN WITH CAN MOVE ARM OK   Sleep apnea    not currently using c-pap   Urinary frequency 11/27/2020   Wears partial dentures 11/27/2020   UPPER AND LOWER    Past Surgical History:  Procedure Laterality Date   COLONSCOPY  10/10/2020   LABAUER GI WITH PRECANCEROUS AREAS REMOVED   TONSILLECTOMY     MORE THAN 20 YRS AGO, ADDENOIDS REMOVED ALSO   tumor neck     benign and removed 15 YRS AGO    Social History   Socioeconomic History   Marital status: Married    Spouse name: Not on file   Number of children: Not on file   Years of education: Not on file   Highest education level: Not on file  Occupational History   Not on file  Tobacco Use   Smoking status: Some Days    Years: 30.00    Types: Cigarettes   Smokeless tobacco: Never   Tobacco comments:    SMOKES SOME  DAYS SMALL FEW CIGARETTES PER PT  Vaping Use   Vaping Use: Never used  Substance and Sexual Activity   Alcohol use: Yes    Alcohol/week: 1.0 standard drink    Types: 1 Standard drinks or equivalent per week   Drug use: Yes    Types: Marijuana    Comment: MARIJUANA LAST USED 11-26-2020 PER PT WIFE   Sexual activity: Yes  Other Topics Concern   Not on file  Social History Narrative   Not on file   Social Determinants of Health   Financial Resource Strain: Not on file  Food Insecurity: Not on file   Transportation Needs: Not on file  Physical Activity: Not on file  Stress: Not on file  Social Connections: Not on file  Intimate Partner Violence: Not on file    Family History  Problem Relation Age of Onset   Diabetes Father    Heart disease Brother    Colon cancer Maternal Grandmother        dx. 60s   Esophageal cancer Maternal Grandfather 86   Prostate cancer Maternal Grandfather 86   Stomach cancer Neg Hx     Medications Prior to Admission  Medication Sig Dispense Refill Last Dose   albuterol (PROVENTIL HFA;VENTOLIN HFA) 108 (90 Base) MCG/ACT inhaler Inhale 1-2 puffs into the lungs every 6 (six) hours as needed for wheezing or shortness of breath. 1 Inhaler 11 Past Month   aspirin EC 81 MG tablet Take 81 mg by mouth daily.   11/29/2020   atorvastatin (LIPITOR) 40 MG tablet Take by mouth daily.   11/29/2020   cetirizine (ZYRTEC) 10 MG tablet Take 10 mg by mouth as needed.   11/29/2020   felodipine (PLENDIL) 10 MG 24 hr tablet Take 10 mg by mouth daily.   11/30/2020 at 0600   lisinopril (PRINIVIL,ZESTRIL) 10 MG tablet Take 10 mg by mouth daily.    11/30/2020 at 0600   metFORMIN (GLUMETZA) 1000 MG (MOD) 24 hr tablet Take 1,000 mg by mouth daily with breakfast.   11/29/2020   omeprazole (PRILOSEC) 40 MG capsule Take by mouth.   11/30/2020 at 0600   Cetirizine HCl (ZERVIATE) 0.24 % SOLN Apply 2 drops to eye 2 (two) times daily. (Patient taking differently: Apply 2 drops to eye as needed.) 30 each 5 Unknown   diphenhydrAMINE (BENADRYL) 25 MG tablet Take 25 mg by mouth every 6 (six) hours as needed.   Unknown   fluticasone (FLONASE) 50 MCG/ACT nasal spray Place 2 sprays into both nostrils daily. (Patient taking differently: Place 2 sprays into both nostrils as needed.) 16 g 5 Unknown   levocetirizine (XYZAL) 5 MG tablet TAKE 1 TABLET BY MOUTH ONCE DAILY IN THE EVENING. PT NEEDS OV FOR ANY ADDITIONAL REFILLS. 30 tablet 5 Unknown   loratadine (CLARITIN) 10 MG tablet Take 10 mg by mouth daily  as needed.   Unknown   triamcinolone (NASACORT) 55 MCG/ACT AERO nasal inhaler Place 2 sprays into the nose daily. 1 Bottle 5 Unknown    Current Facility-Administered Medications  Medication Dose Route Frequency Provider Last Rate Last Admin   0.9 %  sodium chloride infusion   Intravenous Continuous Barnet Glasgow, MD 50 mL/hr at 11/30/20 [id number] Continued from Pre-op at 11/30/20 0822   bupivacaine liposome (EXPAREL) 1.3 % injection 266 mg  20 mL Infiltration Once Michael Boston, MD       cefTRIAXone (ROCEPHIN) 2 g in sodium chloride 0.9 % 100 mL IVPB  2 g Intravenous On Call  to OR Michael Boston, MD       Chlorhexidine Gluconate Cloth 2 % PADS 6 each  6 each Topical Once Kathlynn Swofford, Remo Lipps, MD       And   Chlorhexidine Gluconate Cloth 2 % PADS 6 each  6 each Topical Once Michael Boston, MD       Derrill Memo ON 12/01/2020] feeding supplement (ENSURE PRE-SURGERY) liquid 296 mL  296 mL Oral Once Michael Boston, MD         No Known Allergies  BP (!) 158/104   Pulse 95   Temp 98.9 F (37.2 C) (Oral)   Resp 18   Ht '5\' 11"'$  (1.803 m)   Wt (!) 138.2 kg   SpO2 97%   BMI 42.50 kg/m   Labs: Results for orders placed or performed during the hospital encounter of 11/30/20 (from the past 48 hour(s))  I-STAT, chem 8     Status: Abnormal   Collection Time: 11/30/20  8:00 AM  Result Value Ref Range   Sodium 140 135 - 145 mmol/L   Potassium 3.7 3.5 - 5.1 mmol/L   Chloride 97 (L) 98 - 111 mmol/L   BUN 7 (L) 8 - 23 mg/dL   Creatinine, Ser 0.90 0.61 - 1.24 mg/dL   Glucose, Bld 121 (H) 70 - 99 mg/dL    Comment: Glucose reference range applies only to samples taken after fasting for at least 8 hours.   Calcium, Ion 1.21 1.15 - 1.40 mmol/L   TCO2 30 22 - 32 mmol/L   Hemoglobin 17.3 (H) 13.0 - 17.0 g/dL   HCT 51.0 39.0 - 52.0 %    Imaging / Studies: No results found.   Adin Hector, M.D., F.A.C.S. Gastrointestinal and Minimally Invasive Surgery [facility] [address], [place], [place] [address] [phone] [address] / Paging  11/30/2020 9:32 AM    Adin Hector

## 2020-11-30 NOTE — Discharge Instructions (Addendum)
ANORECTAL SURGERY:  POST OPERATIVE INSTRUCTIONS  ######################################################################  EAT Start with a pureed / full liquid diet After 24 hours, gradually transition to a high fiber diet.    CONTROL PAIN Control pain so you can tolerate bowel movements,  walk, sleep, tolerate sneezing/coughing, and go up/down stairs.   HAVE A BOWEL MOVEMENT DAILY Keep your bowels regular to avoid problems.   Taking a fiber supplement every day to keep bowels soft.   Try a laxative to override constipation. Use an antidairrheal to slow down diarrhea.   Call if not better after 2 tries  WALK Walk an hour a day.  Control your pain to do that.   CALL IF YOU HAVE PROBLEMS/CONCERNS Call if you are still struggling despite following these instructions. Call if you have concerns not answered by these instructions  ######################################################################    Take your usually prescribed home medications unless otherwise directed.  DIET: Follow a light bland diet & liquids the first 24 hours after arrival home, such as soup, liquids, starches, etc.  Be sure to drink plenty of fluids.  Quickly advance to a usual solid diet within a few days.  Avoid fast food or heavy meals as your are more likely to get nauseated or have irregular bowels.  A low-fat, high-fiber diet for the rest of your life is ideal.  PAIN CONTROL: Pain is best controlled by a usual combination of many methods TOGETHER: Warm baths/soaks or Ice packs Over the counter pain medication Prescription pain medications Topical creams  Expect swelling and discomfort in the anus/rectal area.  Warm water baths (30-60 minutes up to 6 times a day, especially after bowel meovements) will help. Use ice for the first few days to help decrease swelling and bruising, then switch to heat such as warm towels, sitz baths, warm baths, etc to help relax tight/sore spots and speed recovery.   Some people prefer to use ice alone, heat alone, alternating between ice & heat.  Experiment to what works for you.   It is helpful to take an over-the-counter pain medication continuously for the first few weeks.  Choose one of the following that works best for you: Naproxen (Aleve, etc)  Two '220mg'$  tabs twice a day Ibuprofen (Advil, etc) Three '200mg'$  tabs four times a day (every meal & bedtime) Acetaminophen (Tylenol, etc) 500-'650mg'$  four times a day (every meal & bedtime) A  prescription for pain medication (such as oxycodone, hydrocodone, etc) should be given to you upon discharge.  Take your pain medication as prescribed.  If you are having problems/concerns with the prescription medicine (does not control pain, nausea, vomiting, rash, itching, etc), please call us [phone] to see if we need to switch you to a different pain medicine that will work better for you and/or control your side effect better. If you need a refill on your pain medication, please contact your pharmacy.  They will contact our office to request authorization. Prescriptions will not be filled after 5 pm or on week-ends.  If can take up to 48 hours for it to be filled & ready so avoid waiting until you are down to thel ast pill. A topical cream (Dibucaine) or a prescription for a cream (such as diltiazem 2% gel) may be given to you.  Many people find relief with topical creams.  Some people find it burns too much.  Experiment.  If it helps, use it.  If it burns, don't using it. You also may receive a prescription for diazepam, and  a muscle relaxant to help you to be able to urinate and defecate more easily.  It is safe to take a few doses with the other medications as long as you are not planning to drive or do anything intense.  Hopefully this can minimize the chance of needing a Foley catheter into your bladder   Use a Sitz Bath 4-8 times a day for relief   Sitz Bath A sitz bath is a warm water bath taken in the sitting  position that covers only the hips and buttocks. It may be used for either healing or hygiene purposes. Sitz baths are also used to relieve pain, itching, or muscle spasms. The water may contain medicine. Moist heat will help you heal and relax.  HOME CARE INSTRUCTIONS  Take 3 to 4 sitz baths a day. Fill the bathtub half full with warm water. Sit in the water and open the drain a little. Turn on the warm water to keep the tub half full. Keep the water running constantly. Soak in the water for 15 to 20 minutes. After the sitz bath, pat the affected area dry first.   KEEP YOUR BOWELS REGULAR The goal is one soft bowel movement a day Avoid getting constipated.  Between the surgery and the pain medications, it is common to experience some constipation.  Increasing fluid intake and taking a fiber supplement (such as Metamucil, Citrucel, FiberCon, MiraLax, etc) 2-3 times a day regularly will usually help prevent this problem from occurring.  A mild laxative (prune juice, Milk of Magnesia, MiraLax, etc) should be taken according to package directions if there are no bowel movements after 48 hours. Watch out for diarrhea.  If you have many loose bowel movements, simplify your diet to bland foods & liquids for a few days.  Stop any stool softeners and decrease your fiber supplement.  Switching to mild anti-diarrheal medications (Kayopectate, Pepto Bismol) can help.  Can try an imodium/loperamide dose.  If this worsens or does not improve, please call us.  Wound Care  Remove your bandages with your first bowel movement, usually the day after surgery.  Let the gauze fall off with the first bowel movement or shower.   Wear an absorbent pad or soft cotton balls in your underwear as needed to catch any drainage and help keep the area  Keep the area clean and dry.  Bathe / shower every day.  Keep the area clean by showering / bathing over the incision / wound.   It is okay to soak an open wound to help wash it.   Consider using a squeeze bottle filled with warm water to gently wash the anal area.  Wet wipes or showers / gentle washing after bowel movements is often less traumatic than regular toilet paper. You will often notice bleeding with bowel movements.  This should slow down by the end of the first week of surgery.  Sitting on an ice pack can help. Expect some drainage.  This should slow down by the end of the first week of surgery, but you will have occasional bleeding or drainage up to a few months after surgery.  Wear an absorbent pad or soft cotton gauze in your underwear until the drainage stops.  ACTIVITIES as tolerated:   You may resume regular (light) daily activities beginning the next day--such as daily self-care, walking, climbing stairs--gradually increasing activities as tolerated.  If you can walk 30 minutes without difficulty, it is safe to try more intense activity such as  jogging, treadmill, bicycling, low-impact aerobics, swimming, etc. Save the most intensive and strenuous activity for last such as sit-ups, heavy lifting, contact sports, etc  Refrain from any heavy lifting or straining until you are off narcotics for pain control.   DO NOT PUSH THROUGH PAIN.  Let pain be your guide: If it hurts to do something, don't do it.  Pain is your body warning you to avoid that activity for another week until the pain goes down. You may drive when you are no longer taking prescription pain medication, you can comfortably sit for long periods of time, and you can safely maneuver your car and apply brakes. You may have sexual intercourse when it is comfortable.  FOLLOW UP in our office Please call [facility] at [phone] to set up an appointment to see your surgeon in the office for a follow-up appointment approximately 2-3 weeks after your surgery. Make sure that you call for this appointment the day you arrive home to ensure a convenient appointment time.  8. IF YOU HAVE DISABILITY OR FAMILY LEAVE  FORMS, BRING THEM TO THE OFFICE FOR PROCESSING.  DO NOT GIVE THEM TO YOUR DOCTOR.        WHEN TO CALL us [phone]: Poor pain control Reactions / problems with new medications (rash/itching, nausea, etc)  Fever over 101.5 F (38.5 C) Inability to urinate Nausea and/or vomiting Worsening swelling or bruising Continued bleeding from incision. Increased pain, redness, or drainage from the incision  The clinic staff is available to answer your questions during regular business hours (8:30am-5pm).  Please don't hesitate to call and ask to speak to one of our nurses for clinical concerns.   A surgeon from [facility] is always on call at the hospitals   If you have a medical emergency, go to the nearest emergency room or call 911.    [facility], [place], [place], [place], [place]  [address] ? MAIN: [phone] ? TOLL FREE: [phone] ? FAX [phone] [email or web address]    Post Anesthesia Home Care Instructions  Activity: Get plenty of rest for the remainder of the day. A responsible individual must stay with you for 24 hours following the procedure.  For the next 24 hours, DO NOT: -Drive a car -Paediatric nurse -Drink alcoholic beverages -Take any medication unless instructed by your physician -Make any legal decisions or sign important papers.  Meals: Start with liquid foods such as gelatin or soup. Progress to regular foods as tolerated. Avoid greasy, spicy, heavy foods. If nausea and/or vomiting occur, drink only clear liquids until the nausea and/or vomiting subsides. Call your physician if vomiting continues.  Special Instructions/Symptoms: Your throat may feel dry or sore from the anesthesia or the breathing tube placed in your throat during surgery. If this causes discomfort, gargle with warm salt water. The discomfort should disappear within 24 hours.  If you had a scopolamine patch placed behind  your ear for the management of post- operative nausea and/or vomiting:  1. The medication in the patch is effective for 72 hours, after which it should be removed.  Wrap patch in a tissue and discard in the trash. Wash hands thoroughly with soap and water. 2. You may remove the patch earlier than 72 hours if you experience unpleasant side effects which may include dry mouth, dizziness or visual disturbances. 3. Avoid touching the patch. Wash your hands with soap and water after contact with the patch.     NO  IBUPROFEN OR TYLENOL UNTIL 2:00PM    Information for Discharge Teaching: EXPAREL (bupivacaine liposome injectable suspension)   Your surgeon gave you EXPAREL(bupivacaine) in your surgical incision to help control your pain after surgery.  EXPAREL is a local anesthetic that provides pain relief by numbing the tissue around the surgical site. EXPAREL is designed to release pain medication over time and can control pain for up to 72 hours. Depending on how you respond to EXPAREL, you may require less pain medication during your recovery.  Possible side effects: Temporary loss of sensation or ability to move in the area where bupivacaine was injected. Nausea, vomiting, constipation Rarely, numbness and tingling in your mouth or lips, lightheadedness, or anxiety may occur. Call your doctor right away if you think you may be experiencing any of these sensations, or if you have other questions regarding possible side effects.  Follow all other discharge instructions given to you by your surgeon or nurse. Eat a healthy diet and drink plenty of water or other fluids.  If you return to the hospital for any reason within 96 hours following the administration of EXPAREL, please inform your health care providers.

## 2020-11-30 NOTE — Anesthesia Preprocedure Evaluation (Addendum)
Anesthesia Evaluation  Patient identified by MRN, date of birth, ID band Patient awake    Reviewed: Allergy & Precautions, NPO status , Patient's Chart, lab work & pertinent test results  Airway Mallampati: III  TM Distance: >3 FB Neck ROM: Full    Dental  (+) Edentulous Upper, Poor Dentition, Missing, Dental Advisory Given, Partial Lower   Pulmonary shortness of breath, asthma , sleep apnea , Current Smoker and Patient abstained from smoking.,    Pulmonary exam normal breath sounds clear to auscultation       Cardiovascular hypertension, Normal cardiovascular exam Rhythm:Regular Rate:Normal     Neuro/Psych PSYCHIATRIC DISORDERS Anxiety Depression negative neurological ROS     GI/Hepatic GERD  ,  Endo/Other  diabetesMorbid obesity  Renal/GU negative Renal ROS     Musculoskeletal  (+) Arthritis ,   Abdominal (+) + obese,   Peds  Hematology negative hematology ROS (+)   Anesthesia Other Findings   Reproductive/Obstetrics                           Anesthesia Physical Anesthesia Plan  ASA: 3  Anesthesia Plan: General   Post-op Pain Management:    Induction: Intravenous  PONV Risk Score and Plan: 3 and Ondansetron, Dexamethasone, Treatment may vary due to age or medical condition and Midazolam  Airway Management Planned: Oral ETT  Additional Equipment: None  Intra-op Plan:   Post-operative Plan: Extubation in OR  Informed Consent: I have reviewed the patients History and Physical, chart, labs and discussed the procedure including the risks, benefits and alternatives for the proposed anesthesia with the patient or authorized representative who has indicated his/her understanding and acceptance.     Dental advisory given  Plan Discussed with: CRNA  Anesthesia Plan Comments:        Anesthesia Quick Evaluation

## 2020-12-01 ENCOUNTER — Encounter (HOSPITAL_BASED_OUTPATIENT_CLINIC_OR_DEPARTMENT_OTHER): Payer: Self-pay | Admitting: Surgery

## 2020-12-04 LAB — SURGICAL PATHOLOGY

## 2020-12-12 ENCOUNTER — Ambulatory Visit (INDEPENDENT_AMBULATORY_CARE_PROVIDER_SITE_OTHER): Payer: BC Managed Care – PPO | Admitting: Allergy & Immunology

## 2020-12-12 ENCOUNTER — Encounter: Payer: Self-pay | Admitting: Allergy & Immunology

## 2020-12-12 ENCOUNTER — Other Ambulatory Visit: Payer: Self-pay

## 2020-12-12 ENCOUNTER — Ambulatory Visit: Payer: Self-pay | Admitting: *Deleted

## 2020-12-12 VITALS — BP 130/82 | HR 115 | Temp 97.2°F | Resp 16 | Ht 70.0 in | Wt 301.6 lb

## 2020-12-12 DIAGNOSIS — J309 Allergic rhinitis, unspecified: Secondary | ICD-10-CM | POA: Diagnosis not present

## 2020-12-12 DIAGNOSIS — J302 Other seasonal allergic rhinitis: Secondary | ICD-10-CM | POA: Diagnosis not present

## 2020-12-12 NOTE — Progress Notes (Signed)
FOLLOW UP  Date of Service/Encounter:  12/12/20   Assessment:   Seasonal and perennial allergic rhinitis (grasses, ragweed, weeds, trees, indoor molds, outdoor molds, dust mites, cat and cockroach) - on allergen immunotherapy with maintenance reached September 2020  Stopping immunotherapy after two years maintenance   Colon cancer - controlled with a routine colonoscopy (localized without the need for any chemotherapy)   Plan/Recommendations:   1. Chronic rhinitis (grasses, ragweed, weeds, trees, indoor molds, outdoor molds, dust mites, cat and cockroach) - STOP shorts today. - We will touch base in one year to see how you are doing.  - Continue taking: alternating antihistamines as you are doing (more Xyzal samples provided today)  2. Return in about 1 year (around 12/12/2021).   Subjective:   Levi Blankenship is a 60 y.o. male presenting today for follow up of  Chief Complaint  Patient presents with   Follow-up    Levi Blankenship has a history of the following: Patient Active Problem List   Diagnosis Date Noted   Multiple adenomatous polyps 11/17/2020   Family history of colon cancer 11/17/2020   Mild intermittent asthma without complication 87/86/7672   New onset type 2 diabetes mellitus ([place]) 12/01/2018   Seasonal and perennial allergic rhinitis 07/08/2018   Laryngopharyngeal reflux (LPR) 01/13/2018   Allergy 01/13/2018   Morbid obesity with BMI of 45.0-49.9, adult ([place]) 04/13/2017   OSA (obstructive sleep apnea) 04/13/2017   Primary osteoarthritis involving multiple joints 07/20/2014   Hypertension, benign 03/22/2003    History obtained from: chart review and patient.  Levi Blankenship is a 60 y.o. male presenting for a follow up visit.  He was last seen in June 2021.  At that time, we continue with allergy shots at the same schedule.  We continued with alternating antihistamines and restarted his Flonase.  Since last visit, he has done well. He actually was diagnosed  with colon cancer, thankfully localized. He did not need chemotherapy at all. However, he has not been getting allergy shots very routinely and is wondering whether he needs to continue with the allergy shots. Overall symptoms are going well without the use of any medications. He is interested in stopping  his allergy shots.   Elohim is on allergen immunotherapy. He receives two injections. Immunotherapy script #1 contains molds, dust mites and cockroach. He currently receives 0.50mL of the RED vial (1/100). Immunotherapy script #2 contains trees, weeds, grasses and cat. He currently receives 0.61mL of the RED vial (1/100). He started shots May of 2020 and reached maintenance in September of 2020.  Otherwise, there have been no changes to his past medical history, surgical history, family history, or social history.    Review of Systems  Constitutional: Negative.  Negative for chills, fever, malaise/fatigue and weight loss.  HENT: Negative.  Negative for congestion, ear discharge, ear pain and sinus pain.   Eyes:  Negative for pain, discharge and redness.  Respiratory:  Negative for cough, sputum production, shortness of breath and wheezing.   Cardiovascular: Negative.  Negative for chest pain and palpitations.  Gastrointestinal:  Negative for abdominal pain, constipation, diarrhea, heartburn, nausea and vomiting.  Skin: Negative.  Negative for itching and rash.  Neurological:  Negative for dizziness and headaches.  Endo/Heme/Allergies:  Negative for environmental allergies. Does not bruise/bleed easily.      Objective:   Blood pressure 130/82, pulse (!) 115, temperature (!) 97.2 F (36.2 C), temperature source Temporal, resp. rate 16, height 5\' 10"  (1.778 m), weight (!) 301  lb 9.6 oz (136.8 kg), SpO2 96 %. Body mass index is 43.28 kg/m.   Physical Exam:  Physical Exam Vitals reviewed.  Constitutional:      Appearance: He is well-developed.  HENT:     Head: Normocephalic and  atraumatic.     Right Ear: Tympanic membrane, ear canal and external ear normal.     Left Ear: Tympanic membrane, ear canal and external ear normal.     Nose: No nasal deformity, septal deviation, mucosal edema or rhinorrhea.     Right Turbinates: Enlarged and swollen.     Left Turbinates: Enlarged and swollen.     Right Sinus: No maxillary sinus tenderness or frontal sinus tenderness.     Left Sinus: No maxillary sinus tenderness or frontal sinus tenderness.     Mouth/Throat:     Mouth: Mucous membranes are not pale and not dry.     Pharynx: Uvula midline.  Eyes:     General: Lids are normal. No allergic shiner.       Right eye: No discharge.        Left eye: No discharge.     Conjunctiva/sclera: Conjunctivae normal.     Right eye: Right conjunctiva is not injected. No chemosis.    Left eye: Left conjunctiva is not injected. No chemosis.    Pupils: Pupils are equal, round, and reactive to light.  Cardiovascular:     Rate and Rhythm: Normal rate and regular rhythm.     Heart sounds: Normal heart sounds.  Pulmonary:     Effort: Pulmonary effort is normal. No tachypnea, accessory muscle usage or respiratory distress.     Breath sounds: Normal breath sounds. No wheezing, rhonchi or rales.     Comments: Moving air well in all lung fields. No increased work of breathing noted. Chest:     Chest wall: No tenderness.  Lymphadenopathy:     Cervical: No cervical adenopathy.  Skin:    Coloration: Skin is not pale.     Findings: No abrasion, erythema, petechiae or rash. Rash is not papular, urticarial or vesicular.  Neurological:     Mental Status: He is alert.  Psychiatric:        Behavior: Behavior is cooperative.     Diagnostic studies: none     Salvatore Marvel, MD  [facility] [place] [facility] [place] [facility]

## 2020-12-12 NOTE — Patient Instructions (Addendum)
1. Chronic rhinitis (grasses, ragweed, weeds, trees, indoor molds, outdoor molds, dust mites, cat and cockroach) - STOP shorts today. - We will touch base in one year to see how you are doing.  - Continue taking: alternating antihistamines as you are doing (more Xyzal samples provided today)  2. Return in about 1 year (around 12/12/2021).    Please inform us of any Emergency Department visits, hospitalizations, or changes in symptoms. Call us before going to the ED for breathing or allergy symptoms since we might be able to fit you in for a sick visit. Feel free to contact us anytime with any questions, problems, or concerns.  It was a pleasure to see you again today!  Websites that have reliable patient information:  1. American Academy of Asthma, Allergy, and Immunology: www.aaaai.org 2. Food Allergy Research and Education (FARE): foodallergy.org 3. Mothers of Asthmatics: http://www.asthmacommunitynetwork.org 4. American College of Allergy, Asthma, and Immunology: www.acaai.org   COVID-19 Vaccine Information can be found at: ShippingScam.co.uk For questions related to vaccine distribution or appointments, please email vaccine@[place] [email or web address] or call [phone].     "Like" [place] on Facebook and Instagram for our latest updates!        Make sure you are registered to vote! If you have moved or changed any of your contact information, you will need to get this updated before voting!  In some cases, you MAY be able to register to vote online: [email or web address]

## 2022-01-31 ENCOUNTER — Encounter (HOSPITAL_COMMUNITY): Payer: Self-pay

## 2022-01-31 ENCOUNTER — Other Ambulatory Visit: Payer: Self-pay

## 2022-01-31 ENCOUNTER — Ambulatory Visit (HOSPITAL_COMMUNITY)
Admission: RE | Admit: 2022-01-31 | Discharge: 2022-01-31 | Disposition: A | Discharge status: Home / Self Care | Payer: Commercial Managed Care - HMO | Source: Ambulatory Visit | Attending: Internal Medicine | Admitting: Internal Medicine

## 2022-01-31 ENCOUNTER — Ambulatory Visit (HOSPITAL_COMMUNITY)
Admission: EM | Admit: 2022-01-31 | Discharge: 2022-01-31 | Disposition: A | Discharge status: Home / Self Care | Payer: Commercial Managed Care - HMO | Attending: Internal Medicine | Admitting: Internal Medicine

## 2022-01-31 DIAGNOSIS — N503 Cyst of epididymis: Secondary | ICD-10-CM | POA: Insufficient documentation

## 2022-01-31 DIAGNOSIS — N5089 Other specified disorders of the male genital organs: Secondary | ICD-10-CM | POA: Diagnosis present

## 2022-01-31 DIAGNOSIS — N433 Hydrocele, unspecified: Secondary | ICD-10-CM | POA: Diagnosis not present

## 2022-01-31 DIAGNOSIS — N50812 Left testicular pain: Secondary | ICD-10-CM | POA: Diagnosis not present

## 2022-01-31 LAB — POCT URINALYSIS DIPSTICK, ED / UC
Glucose, UA: NEGATIVE mg/dL
Leukocytes,Ua: NEGATIVE
Nitrite: NEGATIVE
Protein, ur: 100 mg/dL — AB
Specific Gravity, Urine: 1.025 (ref 1.005–1.030)
Urobilinogen, UA: 1 mg/dL (ref 0.0–1.0)
pH: 6 (ref 5.0–8.0)

## 2022-01-31 MED ORDER — KETOROLAC TROMETHAMINE 60 MG/2ML IM SOLN: 60.0000 mg | Freq: Once | INTRAMUSCULAR | Status: DC

## 2022-01-31 MED ORDER — CEFTRIAXONE SODIUM 500 MG IJ SOLR
500.0000 mg | INTRAMUSCULAR | Status: DC
  Administered 2022-01-31: 500 mg via INTRAMUSCULAR

## 2022-01-31 MED ORDER — TRAMADOL HCL 50 MG PO TABS: 50.0000 mg | ORAL_TABLET | Freq: Three times a day (TID) | ORAL | 0 refills | Status: DC | PRN

## 2022-01-31 MED ORDER — SULFAMETHOXAZOLE-TRIMETHOPRIM 800-160 MG PO TABS: 1.0000 | ORAL_TABLET | Freq: Two times a day (BID) | ORAL | 0 refills | Status: AC

## 2022-01-31 MED ORDER — CEFTRIAXONE SODIUM 500 MG IJ SOLR
INTRAMUSCULAR | Status: AC
  Filled 2022-01-31: qty 500

## 2022-01-31 NOTE — Discharge Instructions (Addendum)
Ultrasound shows that you have a cyst on the left testis. Tramadol as needed for pain Good underwear support recommended Apply cold compresses intermittently as needed.   As pain recedes, begin normal activities slowly as tolerated.  If you have worsening or persistent pain after a few days of antibiotics, fever or chills-please call the urologist office and make an appointment to be seen.  Depending on the results of the scrotal ultrasound, we will make further recommendations.

## 2022-01-31 NOTE — ED Notes (Signed)
Discharged by [facility], CMA.

## 2022-01-31 NOTE — ED Provider Notes (Signed)
[place]    CSN: [id number] Arrival date & time: 01/31/22  4696      History   Chief Complaint Chief Complaint  Patient presents with   Groin Swelling   Testicle Pain    HPI Levi Blankenship is a 61 y.o. male comes to the urgent care with about 4-hour history of sudden onset left testicular pain.  Pain is currently constant, throbbing and moderately severe.  Pain is aggravated by palpation with no known relieving factors.  No radiation of pain.  No dysuria, urgency or frequency.  No penile discharge or discomfort.  No screening on urination.  Pain is associated with swelling of the left testis.  Patient is diabetic and currently managed on oral hypoglycemic agents.  No fever or chills.  No trauma to the testicles.  No history of similar symptoms in the past.  Patient is married and is in a monogamous relationship.   HPI  Past Medical History:  Diagnosis Date   Blood in stool    OVER A MONTH AGO PER PT WIFE ON 11-27-2020   Claustrophobia 11/27/2020   COVID    SPRING 2022 COUGH CHILLS SOB X 4 DAYS ALL SYMPTOMS REOLVED   Dyspnea    OCC DUE TO ACID REFLUX   GERD (gastroesophageal reflux disease)    Hypertension    MILD ANXIETY 11/27/2020   NO MEDS TAKEN   MILD DEPRESSION 11/27/2020   NO MEDS TAKEN   Mild intermittent asthma without complication 29/52/8413   New onset type 2 diabetes mellitus ([place]) 12/01/2018   Prolapsed hemorrhoids 11/27/2020   Rotator cuff tear, left    DONE SEVERAL MNTHS AGO, PER PT WIFE ON 11-27-2020 HAS PAIN WITH CAN MOVE ARM OK   Sleep apnea    not currently using c-pap   Urinary frequency 11/27/2020   Wears partial dentures 11/27/2020   UPPER AND LOWER    Patient Active Problem List   Diagnosis Date Noted   Multiple adenomatous polyps 11/17/2020   Family history of colon cancer 11/17/2020   Mild intermittent asthma without complication 24/40/1027   New onset type 2 diabetes mellitus ([place]) 12/01/2018   Seasonal and perennial  allergic rhinitis 07/08/2018   Laryngopharyngeal reflux (LPR) 01/13/2018   Allergy 01/13/2018   Morbid obesity with BMI of 45.0-49.9, adult ([place]) 04/13/2017   OSA (obstructive sleep apnea) 04/13/2017   Primary osteoarthritis involving multiple joints 07/20/2014   Hypertension, benign 03/22/2003    Past Surgical History:  Procedure Laterality Date   COLONSCOPY  10/10/2020   LABAUER GI WITH PRECANCEROUS AREAS REMOVED   EVALUATION UNDER ANESTHESIA WITH HEMORRHOIDECTOMY N/A 11/30/2020   Procedure: EXCISION OF PROLAPSING ANAL MASS, HEMORRHOIDECTOMY, HEMORRHOIDOPEXY, ANORECTAL EXAMINATION UNDER ANESTHESIA;  Surgeon: Michael Boston, MD;  Location: [facility];  Service: General;  Laterality: N/A;  GEN & LOCAL   TONSILLECTOMY     MORE THAN 20 YRS AGO, ADDENOIDS REMOVED ALSO   tumor neck     benign and removed 15 YRS AGO       Home Medications    Prior to Admission medications   Medication Sig Start Date End Date Taking? Authorizing Provider  aspirin EC 81 MG tablet Take 81 mg by mouth daily.   Yes [provider]  atorvastatin (LIPITOR) 40 MG tablet Take by mouth daily. 11/24/19 01/31/22 Yes [provider]  felodipine (PLENDIL) 10 MG 24 hr tablet Take 10 mg by mouth daily.   Yes [provider]  lisinopril (PRINIVIL,ZESTRIL) 10 MG tablet  Take 10 mg by mouth daily.  03/03/12  Yes [provider]  metFORMIN (GLUMETZA) 1000 MG (MOD) 24 hr tablet Take 1,000 mg by mouth daily with breakfast.   Yes [provider]  omeprazole (PRILOSEC) 40 MG capsule Take by mouth. 01/13/18  Yes [provider]  sulfamethoxazole-trimethoprim (BACTRIM DS) 800-160 MG tablet Take 1 tablet by mouth 2 (two) times daily for 10 days. 01/31/22 02/10/22 Yes Dandrae Kustra, Myrene Galas, MD  traMADol (ULTRAM) 50 MG tablet Take 1 tablet (50 mg total) by mouth every 8 (eight) hours as needed. 01/31/22  Yes Arrionna Serena, Myrene Galas, MD  albuterol (PROVENTIL HFA;VENTOLIN HFA)  108 (90 Base) MCG/ACT inhaler Inhale 1-2 puffs into the lungs every 6 (six) hours as needed for wheezing or shortness of breath. 05/27/18   Robyn Haber, MD  cetirizine (ZYRTEC) 10 MG tablet Take 10 mg by mouth as needed.    [provider]  Cetirizine HCl (ZERVIATE) 0.24 % SOLN Apply 2 drops to eye 2 (two) times daily. Patient taking differently: Apply 2 drops to eye as needed. 08/24/19   Valentina Shaggy, MD  diphenhydrAMINE (BENADRYL) 25 MG tablet Take 25 mg by mouth every 6 (six) hours as needed.    [provider]  loratadine (CLARITIN) 10 MG tablet Take 10 mg by mouth daily as needed.    [provider]    Family History Family History  Problem Relation Age of Onset   Diabetes Father    Heart disease Brother    Colon cancer Maternal Grandmother        dx. 26s   Esophageal cancer Maternal Grandfather 86   Prostate cancer Maternal Grandfather 86   Stomach cancer Neg Hx     Social History Social History   Tobacco Use   Smoking status: Some Days    Years: 30.00    Types: Cigarettes   Smokeless tobacco: Never   Tobacco comments:    SMOKES SOME DAYS SMALL FEW CIGARETTES PER PT  Vaping Use   Vaping Use: Never used  Substance Use Topics   Alcohol use: Yes    Alcohol/week: 1.0 standard drink of alcohol    Types: 1 Standard drinks or equivalent per week   Drug use: Yes    Types: Marijuana    Comment: MARIJUANA LAST USED 11-26-2020 PER PT WIFE     Allergies   Patient has no known allergies.   Review of Systems Review of Systems As per HPI  Physical Exam Triage Vital Signs ED Triage Vitals  Enc Vitals Group     BP 01/31/22 0834 (!) 142/103     Pulse Rate 01/31/22 0834 97     Resp 01/31/22 0834 16     Temp 01/31/22 0834 98.3 F (36.8 C)     Temp Source 01/31/22 0834 Oral     SpO2 01/31/22 0834 100 %     Weight --      Height --      Head Circumference --      Peak Flow --      Pain Score 01/31/22 0833 7     Pain Loc --       Pain Edu? --      Excl. in [place]? --    No data found.  Updated Vital Signs BP (!) 142/103 (BP Location: Right Arm)   Pulse 97   Temp 98.3 F (36.8 C) (Oral)   Resp 16   SpO2 100%   Visual Acuity Right Eye Distance:  Left Eye Distance:   Bilateral Distance:    Right Eye Near:   Left Eye Near:    Bilateral Near:     Physical Exam Vitals and nursing note reviewed.  Constitutional:      General: He is not in acute distress.    Appearance: He is not ill-appearing.  Cardiovascular:     Rate and Rhythm: Normal rate and regular rhythm.     Pulses: Normal pulses.     Heart sounds: Normal heart sounds.  Pulmonary:     Effort: Pulmonary effort is normal.     Breath sounds: Normal breath sounds.  Genitourinary:    Penis: Normal.      Comments: Swelling within the testis.  No changes in scrotal skin.  Swelling is in the posterosuperior aspect of the left testis. Measures about 2 x 1 inch in the longest diameter.  The right testicle is without any tenderness or swelling Neurological:     Mental Status: He is alert.      UC Treatments / Results  Labs (all labs ordered are listed, but only abnormal results are displayed) Labs Reviewed  POCT URINALYSIS DIPSTICK, ED / UC - Abnormal; Notable for the following components:      Result Value   Bilirubin Urine SMALL (*)    Ketones, ur TRACE (*)    Hgb urine dipstick TRACE (*)    Protein, ur 100 (*)    All other components within normal limits    EKG   Radiology US SCROTUM W/DOPPLER  Result Date: 01/31/2022 CLINICAL DATA:  Left testicular swelling. EXAM: SCROTAL ULTRASOUND DOPPLER ULTRASOUND OF THE TESTICLES TECHNIQUE: Complete ultrasound examination of the testicles, epididymis, and other scrotal structures was performed. Color and spectral Doppler ultrasound were also utilized to evaluate blood flow to the testicles. COMPARISON:  None Available. FINDINGS: Right testicle Measurements: 4.7 x 3.5 x 2.0 cm. No mass or  microlithiasis visualized. Left testicle Measurements: 4.1 x 2.9 x 2.8 cm. No mass or microlithiasis visualized. Right epididymis:  Normal in size and appearance. Left epididymis: Multiple cysts are noted, the largest measuring 1.5 cm. Hydrocele: Moderate size septated left hydrocele is noted which may be inflammatory in etiology. Varicocele:  None visualized. Pulsed Doppler interrogation of both testes demonstrates normal low resistance arterial and venous waveforms bilaterally. IMPRESSION: No evidence of testicular mass or torsion. Moderate size septated left hydrocele is noted which may be inflammatory in etiology. Multiple left epididymal cysts are noted. Electronically Signed   By: Marijo Conception M.D.   On: 01/31/2022 10:27    Procedures Procedures (including critical care time)  Medications Ordered in UC Medications - No data to display  Initial Impression / Assessment and Plan / UC Course  I have reviewed the triage vital signs and the nursing notes.  Pertinent labs & imaging results that were available during my care of the patient were reviewed by me and considered in my medical decision making (see chart for details).     1.  Left testicular pain: Ultrasound of the left testis was negative for testicular torsion.  There was a moderate-sized septated left hydrocele with inflammatory changes. I discussed the case with the urologist on-call who recommended pain control and a 10-day course of antibiotics Patient was started on Bactrim twice daily for 10 days.  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 Final Clinical Impressions(s) / UC Diagnoses   Final diagnoses:  Left testicular pain     Discharge Instructions      Ultrasound shows that you have a cyst on the left  testis. Tramadol as needed for pain Good underwear support recommended Apply cold compresses intermittently as needed.   As pain recedes, begin normal activities slowly as tolerated.  If you have worsening or persistent pain after a few days of antibiotics, fever or chills-please call the urologist office and make an appointment to be seen.  Depending on the results of the scrotal ultrasound, we will make further recommendations.    ED Prescriptions     Medication Sig Dispense Auth. Provider   sulfamethoxazole-trimethoprim (BACTRIM DS) 800-160 MG tablet Take 1 tablet by mouth 2 (two) times daily for 10 days. 20 tablet Maddalena Linarez, Myrene Galas, MD   traMADol (ULTRAM) 50 MG tablet Take 1 tablet (50 mg total) by mouth every 8 (eight) hours as needed. 15 tablet Sole Lengacher, Myrene Galas, MD      I have reviewed the PDMP during this encounter.   Chase Picket, MD 02/01/22 2221

## 2022-01-31 NOTE — ED Triage Notes (Signed)
Pain and swelling in the left testicle last night. No previous issues. No falls or injures that Patient knows of.  Patient having urinary frequency for a while but no other issues.

## 2022-05-03 ENCOUNTER — Emergency Department (HOSPITAL_COMMUNITY)
Admission: EM | Admit: 2022-05-03 | Discharge: 2022-05-03 | Disposition: A | Discharge status: Home / Self Care | Payer: BLUE CROSS/BLUE SHIELD | Attending: Emergency Medicine | Admitting: Emergency Medicine

## 2022-05-03 ENCOUNTER — Emergency Department (HOSPITAL_COMMUNITY): Payer: BLUE CROSS/BLUE SHIELD

## 2022-05-03 ENCOUNTER — Encounter (HOSPITAL_COMMUNITY): Payer: Self-pay

## 2022-05-03 DIAGNOSIS — Z8616 Personal history of COVID-19: Secondary | ICD-10-CM | POA: Insufficient documentation

## 2022-05-03 DIAGNOSIS — E119 Type 2 diabetes mellitus without complications: Secondary | ICD-10-CM | POA: Diagnosis not present

## 2022-05-03 DIAGNOSIS — Z20822 Contact with and (suspected) exposure to covid-19: Secondary | ICD-10-CM | POA: Diagnosis not present

## 2022-05-03 DIAGNOSIS — J441 Chronic obstructive pulmonary disease with (acute) exacerbation: Secondary | ICD-10-CM | POA: Insufficient documentation

## 2022-05-03 DIAGNOSIS — I10 Essential (primary) hypertension: Secondary | ICD-10-CM | POA: Insufficient documentation

## 2022-05-03 DIAGNOSIS — J45909 Unspecified asthma, uncomplicated: Secondary | ICD-10-CM | POA: Insufficient documentation

## 2022-05-03 DIAGNOSIS — Z7984 Long term (current) use of oral hypoglycemic drugs: Secondary | ICD-10-CM | POA: Insufficient documentation

## 2022-05-03 DIAGNOSIS — Z7982 Long term (current) use of aspirin: Secondary | ICD-10-CM | POA: Diagnosis not present

## 2022-05-03 DIAGNOSIS — Z7951 Long term (current) use of inhaled steroids: Secondary | ICD-10-CM | POA: Insufficient documentation

## 2022-05-03 DIAGNOSIS — R0602 Shortness of breath: Secondary | ICD-10-CM | POA: Diagnosis present

## 2022-05-03 DIAGNOSIS — F17210 Nicotine dependence, cigarettes, uncomplicated: Secondary | ICD-10-CM | POA: Insufficient documentation

## 2022-05-03 DIAGNOSIS — Z79899 Other long term (current) drug therapy: Secondary | ICD-10-CM | POA: Diagnosis not present

## 2022-05-03 LAB — BASIC METABOLIC PANEL
Anion gap: 10 (ref 5–15)
BUN: 8 mg/dL (ref 8–23)
CO2: 28 mmol/L (ref 22–32)
Calcium: 8.9 mg/dL (ref 8.9–10.3)
Chloride: 97 mmol/L — ABNORMAL LOW (ref 98–111)
Creatinine, Ser: 1.07 mg/dL (ref 0.61–1.24)
GFR, Estimated: >60 mL/min (ref >60)
Glucose, Bld: 152 mg/dL — ABNORMAL HIGH (ref 70–99)
Potassium: 3.4 mmol/L — ABNORMAL LOW (ref 3.5–5.1)
Sodium: 135 mmol/L (ref 135–145)

## 2022-05-03 LAB — TROPONIN I (HIGH SENSITIVITY)
Troponin I (High Sensitivity): 24 ng/L — ABNORMAL HIGH (ref <18)
Troponin I (High Sensitivity): 31 ng/L — ABNORMAL HIGH (ref <18)

## 2022-05-03 LAB — RESP PANEL BY RT-PCR (RSV, FLU A&B, COVID)  RVPGX2
Influenza A by PCR: NEGATIVE
Influenza B by PCR: NEGATIVE
Resp Syncytial Virus by PCR: NEGATIVE
SARS Coronavirus 2 by RT PCR: NEGATIVE

## 2022-05-03 LAB — CBC
HCT: 45.4 % (ref 39.0–52.0)
Hemoglobin: 15.1 g/dL (ref 13.0–17.0)
MCH: 31.4 pg (ref 26.0–34.0)
MCHC: 33.3 g/dL (ref 30.0–36.0)
MCV: 94.4 fL (ref 80.0–100.0)
Platelets: 275 10*3/uL (ref 150–400)
RBC: 4.81 MIL/uL (ref 4.22–5.81)
RDW: 13.6 % (ref 11.5–15.5)
WBC: 10.7 10*3/uL — ABNORMAL HIGH (ref 4.0–10.5)
nRBC: 0 % (ref 0.0–0.2)

## 2022-05-03 LAB — BRAIN NATRIURETIC PEPTIDE: B Natriuretic Peptide: 76.8 pg/mL (ref 0.0–100.0)

## 2022-05-03 MED ORDER — ALBUTEROL SULFATE HFA 108 (90 BASE) MCG/ACT IN AERS: 2.0000 | INHALATION_SPRAY | Freq: Four times a day (QID) | RESPIRATORY_TRACT | 11 refills | Status: DC | PRN

## 2022-05-03 MED ORDER — ALBUTEROL SULFATE (2.5 MG/3ML) 0.083% IN NEBU
10.0000 mg/h | INHALATION_SOLUTION | Freq: Once | RESPIRATORY_TRACT | Status: AC
  Administered 2022-05-03: 10 mg/h via RESPIRATORY_TRACT
  Filled 2022-05-03: qty 12

## 2022-05-03 MED ORDER — ALBUTEROL SULFATE HFA 108 (90 BASE) MCG/ACT IN AERS: 1.0000 | INHALATION_SPRAY | Freq: Four times a day (QID) | RESPIRATORY_TRACT | 11 refills | Status: DC | PRN

## 2022-05-03 MED ORDER — AMOXICILLIN-POT CLAVULANATE 875-125 MG PO TABS: 1.0000 | ORAL_TABLET | Freq: Two times a day (BID) | ORAL | 0 refills | Status: AC

## 2022-05-03 MED ORDER — IPRATROPIUM-ALBUTEROL 0.5-2.5 (3) MG/3ML IN SOLN
3.0000 mL | Freq: Once | RESPIRATORY_TRACT | Status: AC
  Administered 2022-05-03: 3 mL via RESPIRATORY_TRACT
  Filled 2022-05-03: qty 3

## 2022-05-03 MED ORDER — PREDNISONE 50 MG PO TABS: 50.0000 mg | ORAL_TABLET | Freq: Every day | ORAL | 0 refills | Status: AC

## 2022-05-03 NOTE — ED Notes (Signed)
Pt ambulated to the restroom, room air sat after remained at 86%. Pt placed back on 2L [place]

## 2022-05-03 NOTE — ED Triage Notes (Signed)
Pt BIBA from home . Pt has hx of mild asthma. Pt c/o congestion x 1 week . Pt c/o SHOB today, used inhalers PTA. Pt had wheezing all over. Pt had duoneb and solumedrol with EMS. Pt has rales now, wheezing cleared up . Pt is supposed to wear CPAP and does not have one.  Pt c/o tightness in belly.  180/110- no BP meds today.  96% RA  18 L AC

## 2022-05-03 NOTE — ED Provider Notes (Signed)
[facility] Provider Note  CSN: [id number] Arrival date & time: 05/03/22 [id number]  Chief Complaint(s) Shortness of Breath  HPI Levi Blankenship. Levi Blankenship is a 62 y.o. male history of asthma, diabetes presenting to the emergency department with shortness of breath.  Patient reports that he has had a cough for the past week, nonproductive.  Reports that yesterday he felt nauseous and had 1 episode of vomiting.  This morning he felt increasingly short of breath, no chest pain, no fevers or chills, no abdominal pain, diarrhea, back pain.  He called the paramedics, received DuoNeb and Solu-Medrol, reports feeling better now.  Denies ongoing nausea or vomiting.  Denies any other symptoms such as sore throat, runny nose.   Past Medical History Past Medical History:  Diagnosis Date   Blood in stool    OVER A MONTH AGO PER PT WIFE ON 11-27-2020   Claustrophobia 11/27/2020   COVID    SPRING 2022 COUGH CHILLS SOB X 4 DAYS ALL SYMPTOMS REOLVED   Dyspnea    OCC DUE TO ACID REFLUX   GERD (gastroesophageal reflux disease)    Hypertension    MILD ANXIETY 11/27/2020   NO MEDS TAKEN   MILD DEPRESSION 11/27/2020   NO MEDS TAKEN   Mild intermittent asthma without complication [id number]   New onset type 2 diabetes mellitus ([place]) 12/01/2018   Prolapsed hemorrhoids 11/27/2020   Rotator cuff tear, left    DONE SEVERAL MNTHS AGO, PER PT WIFE ON 11-27-2020 HAS PAIN WITH CAN MOVE ARM OK   Sleep apnea    not currently using c-pap   Urinary frequency 11/27/2020   Wears partial dentures 11/27/2020   UPPER AND LOWER   Patient Active Problem List   Diagnosis Date Noted   Multiple adenomatous polyps 11/17/2020   Family history of colon cancer 11/17/2020   Mild intermittent asthma without complication [id number]   New onset type 2 diabetes mellitus ([place]) 12/01/2018   Seasonal and perennial allergic rhinitis 07/08/2018   Laryngopharyngeal reflux (LPR) 01/13/2018   Allergy  01/13/2018   Morbid obesity with BMI of 45.0-49.9, adult ([place]) 04/13/2017   OSA (obstructive sleep apnea) 04/13/2017   Primary osteoarthritis involving multiple joints 07/20/2014   Hypertension, benign 03/22/2003   Home Medication(s) Prior to Admission medications   Medication Sig Start Date End Date Taking? Authorizing Provider  amoxicillin-clavulanate (AUGMENTIN) 875-125 MG tablet Take 1 tablet by mouth every 12 (twelve) hours for 5 days. 05/03/22 05/08/22 Yes Cristie Hem, MD  predniSONE (DELTASONE) 50 MG tablet Take 1 tablet (50 mg total) by mouth daily for 5 days. 05/04/22 05/09/22 Yes Cristie Hem, MD  albuterol (VENTOLIN HFA) 108 (90 Base) MCG/ACT inhaler Inhale 1-2 puffs into the lungs every 6 (six) hours as needed for wheezing or shortness of breath. 05/03/22   Cristie Hem, MD  aspirin EC 81 MG tablet Take 81 mg by mouth daily.    [provider]  atorvastatin (LIPITOR) 40 MG tablet Take by mouth daily. 11/24/19 01/31/22  [provider]  cetirizine (ZYRTEC) 10 MG tablet Take 10 mg by mouth as needed.    [provider]  Cetirizine HCl (ZERVIATE) 0.24 % SOLN Apply 2 drops to eye 2 (two) times daily. Patient taking differently: Apply 2 drops to eye as needed. 08/24/19   Valentina Shaggy, MD  diphenhydrAMINE (BENADRYL) 25 MG tablet Take 25 mg by mouth every 6 (six) hours as needed.    [provider]  felodipine (  PLENDIL) 10 MG 24 hr tablet Take 10 mg by mouth daily.    [provider]  lisinopril (PRINIVIL,ZESTRIL) 10 MG tablet Take 10 mg by mouth daily.  03/03/12   [provider]  loratadine (CLARITIN) 10 MG tablet Take 10 mg by mouth daily as needed.    [provider]  metFORMIN (GLUMETZA) 1000 MG (MOD) 24 hr tablet Take 1,000 mg by mouth daily with breakfast.    [provider]  omeprazole (PRILOSEC) 40 MG capsule Take by mouth. 01/13/18   [provider]  traMADol (ULTRAM) 50 MG  tablet Take 1 tablet (50 mg total) by mouth every 8 (eight) hours as needed. 01/31/22   Chase Picket, MD                                                                                                                                    Past Surgical History Past Surgical History:  Procedure Laterality Date   COLONSCOPY  10/10/2020   LABAUER GI WITH PRECANCEROUS AREAS REMOVED   EVALUATION UNDER ANESTHESIA WITH HEMORRHOIDECTOMY N/A 11/30/2020   Procedure: EXCISION OF PROLAPSING ANAL MASS, HEMORRHOIDECTOMY, HEMORRHOIDOPEXY, ANORECTAL EXAMINATION UNDER ANESTHESIA;  Surgeon: Michael Boston, MD;  Location: [facility];  Service: General;  Laterality: N/A;  GEN & LOCAL   TONSILLECTOMY     MORE THAN 20 YRS AGO, ADDENOIDS REMOVED ALSO   tumor neck     benign and removed 15 YRS AGO   Family History Family History  Problem Relation Age of Onset   Diabetes Father    Heart disease Brother    Colon cancer Maternal Grandmother        dx. 37s   Esophageal cancer Maternal Grandfather 86   Prostate cancer Maternal Grandfather 86   Stomach cancer Neg Hx     Social History Social History   Tobacco Use   Smoking status: Some Days    Years: 30.00    Types: Cigarettes   Smokeless tobacco: Never   Tobacco comments:    SMOKES SOME DAYS SMALL FEW CIGARETTES PER PT  Vaping Use   Vaping Use: Never used  Substance Use Topics   Alcohol use: Yes    Alcohol/week: 1.0 standard drink of alcohol    Types: 1 Standard drinks or equivalent per week   Drug use: Yes    Types: Marijuana    Comment: MARIJUANA LAST USED 11-26-2020 PER PT WIFE   Allergies Patient has no known allergies.  Review of Systems Review of Systems  All other systems reviewed and are negative.   Physical Exam Vital Signs  I have reviewed the triage vital signs BP (!) 162/93   Pulse 89   Temp 98 F (36.7 C)   Resp 14   Ht 5' 10"$  (1.778 m)   Wt (!) 140.9 kg   SpO2 96%   BMI 44.57 kg/m  Physical  Exam Vitals and nursing note  reviewed.  Constitutional:      General: He is not in acute distress.    Appearance: Normal appearance.  HENT:     Mouth/Throat:     Mouth: Mucous membranes are moist.  Eyes:     Conjunctiva/sclera: Conjunctivae normal.  Cardiovascular:     Rate and Rhythm: Normal rate and regular rhythm.  Pulmonary:     Comments: Mildly increased work of breathing, diffuse wheezes in all lung fields  Abdominal:     General: Abdomen is flat.     Palpations: Abdomen is soft.     Tenderness: There is no abdominal tenderness.  Musculoskeletal:     Right lower leg: No edema.     Left lower leg: No edema.  Skin:    General: Skin is warm and dry.     Capillary Refill: Capillary refill takes less than 2 seconds.  Neurological:     Mental Status: He is alert and oriented to person, place, and time. Mental status is at baseline.  Psychiatric:        Mood and Affect: Mood normal.        Behavior: Behavior normal.     ED Results and Treatments Labs (all labs ordered are listed, but only abnormal results are displayed) Labs Reviewed  BASIC METABOLIC PANEL - Abnormal; Notable for the following components:      Result Value   Potassium 3.4 (*)    Chloride 97 (*)    Glucose, Bld 152 (*)    All other components within normal limits  CBC - Abnormal; Notable for the following components:   WBC 10.7 (*)    All other components within normal limits  TROPONIN I (HIGH SENSITIVITY) - Abnormal; Notable for the following components:   Troponin I (High Sensitivity) 24 (*)    All other components within normal limits  TROPONIN I (HIGH SENSITIVITY) - Abnormal; Notable for the following components:   Troponin I (High Sensitivity) 31 (*)    All other components within normal limits  RESP PANEL BY RT-PCR (RSV, FLU A&B, COVID)  RVPGX2  BRAIN NATRIURETIC PEPTIDE                                                                                                                           Radiology DG Chest 2 View  Result Date: 05/03/2022 CLINICAL DATA:  Cough EXAM: CHEST - 2 VIEW COMPARISON:  01/10/2018 FINDINGS: Cardiomegaly. Mildly coarsened bibasilar interstitial markings. No focal airspace consolidation. No pleural effusion or pneumothorax. IMPRESSION: Cardiomegaly with mildly coarsened bibasilar interstitial markings, which may reflect bronchitic type lung changes. Electronically Signed   By: Davina Poke D.O.   On: 05/03/2022 09:09    Pertinent labs & imaging results that were available during my care of the patient were reviewed by me and considered in my medical decision making (see MDM for details).  Medications Ordered in ED Medications  ipratropium-albuterol (DUONEB) 0.5-2.5 (3) MG/3ML nebulizer solution 3 mL (3 mLs Nebulization Given  05/03/22 0909)  albuterol (PROVENTIL) (2.5 MG/3ML) 0.083% nebulizer solution (10 mg/hr Nebulization Given 05/03/22 1010)                                                                                                                                     Procedures Procedures  (including critical care time)  Medical Decision Making / ED Course   MDM:  62 year old male presenting to the emergency department shortness of breath.  Patient well-appearing, mildly increased work of breathing, pulmonary exam with diffuse wheeze/rhonchi, bibasilar crackles.  Differential includes pneumonia, asthma exacerbation, acute CHF.  No chest pain, recent travel or surgeries to suggest pulmonary embolism.  No chest pain to suggest ACS and ECG not suggestive of ACS but will check troponin.  Doubt pneumothorax.  Will check chest x-ray, labs including troponin and BNP.  Will reassess.  If patient remains hypoxic after further breathing treatments may end up needing to be admitted.  Also need admission if chest x-ray shows pneumonia given hypoxia.  Clinical Course as of 05/03/22 1249  Fri May 03, 2022  1243 Patient feels much better.  Hypoxia  resolved.  Repeat troponin 31 from 24.  Suspect this is likely demand related, EKG does not show any ischemic changes and his chest tightness resolved with breathing treatment.  He is able to ambulate without hypoxia.  Chest x-ray shows bronchitis, although patient has asthma rather than COPD, he is an active smoker so we will treat for COPD exacerbation/asthma exacerbation with steroids, refill albuterol inhaler, and antibiotics.  Advise close follow-up with primary physician and strict return precautions. Will discharge patient to home. All questions answered. Patient comfortable with plan of discharge. Return precautions discussed with patient and specified on the after visit summary.  [WS]    Clinical Course User Index [WS] Cristie Hem, MD     Additional history obtained: -Additional history obtained from ems and spouse -External records from outside source obtained and reviewed including: Chart review including previous notes, labs, imaging, consultation notes including ED visit 01/31/22   Lab Tests: -I ordered, reviewed, and interpreted labs.   The pertinent results include:   Labs Reviewed  BASIC METABOLIC PANEL - Abnormal; Notable for the following components:      Result Value   Potassium 3.4 (*)    Chloride 97 (*)    Glucose, Bld 152 (*)    All other components within normal limits  CBC - Abnormal; Notable for the following components:   WBC 10.7 (*)    All other components within normal limits  TROPONIN I (HIGH SENSITIVITY) - Abnormal; Notable for the following components:   Troponin I (High Sensitivity) 24 (*)    All other components within normal limits  TROPONIN I (HIGH SENSITIVITY) - Abnormal; Notable for the following components:   Troponin I (High Sensitivity) 31 (*)    All other components within normal limits  RESP PANEL BY RT-PCR (RSV,  FLU A&B, COVID)  RVPGX2  BRAIN NATRIURETIC PEPTIDE    Notable for normal BNP, mild troponin elevation likely due to  demand  EKG   EKG Interpretation  Date/Time:  Friday May 03 2022 08:16:44 EST Ventricular Rate:  97 PR Interval:  177 QRS Duration: 107 QT Interval:  405 QTC Calculation: 515 R Axis:   59 Text Interpretation: Sinus rhythm Borderline repolarization abnormality Prolonged QT interval Confirmed by Garnette Gunner [phone]) on 05/03/2022 8:48:23 AM         Imaging Studies ordered: I ordered imaging studies including CXR On my interpretation imaging demonstrates bronchitis  I independently visualized and interpreted imaging. I agree with the radiologist interpretation   Medicines ordered and prescription drug management: Meds ordered this encounter  Medications   ipratropium-albuterol (DUONEB) 0.5-2.5 (3) MG/3ML nebulizer solution 3 mL   albuterol (PROVENTIL) (2.5 MG/3ML) 0.083% nebulizer solution   predniSONE (DELTASONE) 50 MG tablet    Sig: Take 1 tablet (50 mg total) by mouth daily for 5 days.    Dispense:  5 tablet    Refill:  0   albuterol (VENTOLIN HFA) 108 (90 Base) MCG/ACT inhaler    Sig: Inhale 1-2 puffs into the lungs every 6 (six) hours as needed for wheezing or shortness of breath.    Dispense:  1 each    Refill:  11   amoxicillin-clavulanate (AUGMENTIN) 875-125 MG tablet    Sig: Take 1 tablet by mouth every 12 (twelve) hours for 5 days.    Dispense:  10 tablet    Refill:  0    -I have reviewed the patients home medicines and have made adjustments as needed   Cardiac Monitoring: The patient was maintained on a cardiac monitor.  I personally viewed and interpreted the cardiac monitored which showed an underlying rhythm of: NSR  Social Determinants of Health:  Diagnosis or treatment significantly limited by social determinants of health: current smoker and obesity   Reevaluation: After the interventions noted above, I reevaluated the patient and found that they have improved  Co morbidities that complicate the patient evaluation  Past Medical  History:  Diagnosis Date   Blood in stool    OVER A MONTH AGO PER PT WIFE ON 11-27-2020   Claustrophobia 11/27/2020   COVID    SPRING 2022 COUGH CHILLS SOB X 4 DAYS ALL SYMPTOMS REOLVED   Dyspnea    OCC DUE TO ACID REFLUX   GERD (gastroesophageal reflux disease)    Hypertension    MILD ANXIETY 11/27/2020   NO MEDS TAKEN   MILD DEPRESSION 11/27/2020   NO MEDS TAKEN   Mild intermittent asthma without complication [id number]   New onset type 2 diabetes mellitus ([place]) 12/01/2018   Prolapsed hemorrhoids 11/27/2020   Rotator cuff tear, left    DONE SEVERAL MNTHS AGO, PER PT WIFE ON 11-27-2020 HAS PAIN WITH CAN MOVE ARM OK   Sleep apnea    not currently using c-pap   Urinary frequency 11/27/2020   Wears partial dentures 11/27/2020   UPPER AND LOWER      Dispostion: Disposition decision including need for hospitalization was considered, and patient discharged from emergency department.    Final Clinical Impression(s) / ED Diagnoses Final diagnoses:  COPD exacerbation ([place])     This chart was dictated using voice recognition software.  Despite best efforts to proofread,  errors can occur which can change the documentation meaning.    Cristie Hem, MD 05/03/22 1249

## 2022-05-03 NOTE — ED Notes (Signed)
Walked patient around the nursing station patient did well patient stayed at 96 room air went down when we stopped at 89 room air but went back up to 96 room air patient is now back in bed on the monitor with family at bedside and call bell in reach

## 2022-05-03 NOTE — ED Notes (Addendum)
error 

## 2022-05-03 NOTE — Discharge Instructions (Addendum)
We evaluated you for your difficulty breathing.  Your symptoms improved with breathing treatments and your oxygen level improved.  You also had findings of bronchitis on your chest x-ray.  I prescribed you a course of steroids and refilled your albuterol inhaler.  Please start your steroids tomorrow.  I have also prescribed you antibiotics.  Please start these today.  Please follow-up closely with your primary doctor.  Please return if you have any worsening symptoms, recurrent shortness of breath, chest pain, nausea or vomiting, sweating, fevers, worsening cough, fainting, or any other concerning symptoms.

## 2022-05-16 ENCOUNTER — Encounter: Payer: Self-pay | Admitting: Internal Medicine

## 2022-05-18 ENCOUNTER — Other Ambulatory Visit: Payer: Self-pay

## 2022-05-18 ENCOUNTER — Observation Stay (HOSPITAL_COMMUNITY)
Admission: EM | Admit: 2022-05-18 | Discharge: 2022-05-19 | Disposition: A | Discharge status: Home / Self Care | Payer: BLUE CROSS/BLUE SHIELD | Attending: Family Medicine | Admitting: Family Medicine

## 2022-05-18 ENCOUNTER — Emergency Department (HOSPITAL_COMMUNITY): Payer: BLUE CROSS/BLUE SHIELD

## 2022-05-18 ENCOUNTER — Encounter (HOSPITAL_COMMUNITY): Payer: Self-pay | Admitting: Family Medicine

## 2022-05-18 DIAGNOSIS — I110 Hypertensive heart disease with heart failure: Secondary | ICD-10-CM | POA: Insufficient documentation

## 2022-05-18 DIAGNOSIS — Z7982 Long term (current) use of aspirin: Secondary | ICD-10-CM | POA: Insufficient documentation

## 2022-05-18 DIAGNOSIS — J45901 Unspecified asthma with (acute) exacerbation: Secondary | ICD-10-CM | POA: Insufficient documentation

## 2022-05-18 DIAGNOSIS — Z79899 Other long term (current) drug therapy: Secondary | ICD-10-CM | POA: Diagnosis not present

## 2022-05-18 DIAGNOSIS — R0602 Shortness of breath: Secondary | ICD-10-CM

## 2022-05-18 DIAGNOSIS — I509 Heart failure, unspecified: Secondary | ICD-10-CM | POA: Insufficient documentation

## 2022-05-18 DIAGNOSIS — E119 Type 2 diabetes mellitus without complications: Secondary | ICD-10-CM | POA: Diagnosis not present

## 2022-05-18 DIAGNOSIS — Z720 Tobacco use: Secondary | ICD-10-CM

## 2022-05-18 DIAGNOSIS — Z7984 Long term (current) use of oral hypoglycemic drugs: Secondary | ICD-10-CM | POA: Diagnosis not present

## 2022-05-18 DIAGNOSIS — J441 Chronic obstructive pulmonary disease with (acute) exacerbation: Secondary | ICD-10-CM | POA: Diagnosis present

## 2022-05-18 DIAGNOSIS — F17210 Nicotine dependence, cigarettes, uncomplicated: Secondary | ICD-10-CM | POA: Diagnosis not present

## 2022-05-18 DIAGNOSIS — U071 COVID-19: Secondary | ICD-10-CM | POA: Diagnosis not present

## 2022-05-18 LAB — RESPIRATORY PANEL BY PCR

## 2022-05-18 LAB — RESP PANEL BY RT-PCR (RSV, FLU A&B, COVID)  RVPGX2
Influenza A by PCR: NEGATIVE
Influenza B by PCR: NEGATIVE
Resp Syncytial Virus by PCR: NEGATIVE
SARS Coronavirus 2 by RT PCR: POSITIVE — AB

## 2022-05-18 LAB — I-STAT CHEM 8, ED
BUN: 8 mg/dL (ref 8–23)
Calcium, Ion: 1.12 mmol/L — ABNORMAL LOW (ref 1.15–1.40)
Chloride: 96 mmol/L — ABNORMAL LOW (ref 98–111)
Creatinine, Ser: 1 mg/dL (ref 0.61–1.24)
Glucose, Bld: 127 mg/dL — ABNORMAL HIGH (ref 70–99)
HCT: 45 % (ref 39.0–52.0)
Hemoglobin: 15.3 g/dL (ref 13.0–17.0)
Potassium: 3.8 mmol/L (ref 3.5–5.1)
Sodium: 138 mmol/L (ref 135–145)
TCO2: 33 mmol/L — ABNORMAL HIGH (ref 22–32)

## 2022-05-18 LAB — TROPONIN I (HIGH SENSITIVITY)
Troponin I (High Sensitivity): 20 ng/L — ABNORMAL HIGH (ref <18)
Troponin I (High Sensitivity): 29 ng/L — ABNORMAL HIGH (ref <18)

## 2022-05-18 LAB — BRAIN NATRIURETIC PEPTIDE: B Natriuretic Peptide: 92.3 pg/mL (ref 0.0–100.0)

## 2022-05-18 LAB — GLUCOSE, CAPILLARY: Glucose-Capillary: 194 mg/dL — ABNORMAL HIGH (ref 70–99)

## 2022-05-18 MED ORDER — LISINOPRIL 10 MG PO TABS
10.0000 mg | ORAL_TABLET | Freq: Every day | ORAL | Status: DC
  Administered 2022-05-19: 10 mg via ORAL
  Filled 2022-05-18: qty 1

## 2022-05-18 MED ORDER — PANTOPRAZOLE SODIUM 40 MG PO TBEC
40.0000 mg | DELAYED_RELEASE_TABLET | Freq: Every day | ORAL | Status: DC
  Administered 2022-05-18 – 2022-05-19 (×2): 40 mg via ORAL
  Filled 2022-05-18 (×2): qty 1

## 2022-05-18 MED ORDER — ENOXAPARIN SODIUM 80 MG/0.8ML IJ SOSY
0.5000 mg/kg | PREFILLED_SYRINGE | INTRAMUSCULAR | Status: DC
  Filled 2022-05-18: qty 0.7

## 2022-05-18 MED ORDER — FELODIPINE ER 5 MG PO TB24
10.0000 mg | ORAL_TABLET | Freq: Every day | ORAL | Status: DC
  Administered 2022-05-19: 10 mg via ORAL
  Filled 2022-05-18 (×2): qty 2

## 2022-05-18 MED ORDER — METHYLPREDNISOLONE SODIUM SUCC 125 MG IJ SOLR
125.0000 mg | Freq: Once | INTRAMUSCULAR | Status: AC
  Administered 2022-05-18: 125 mg via INTRAVENOUS
  Filled 2022-05-18: qty 2

## 2022-05-18 MED ORDER — IOHEXOL 350 MG/ML SOLN
75.0000 mL | Freq: Once | INTRAVENOUS | Status: AC | PRN
  Administered 2022-05-18: 75 mL via INTRAVENOUS

## 2022-05-18 MED ORDER — ASPIRIN 81 MG PO TBEC
81.0000 mg | DELAYED_RELEASE_TABLET | Freq: Every day | ORAL | Status: DC
  Administered 2022-05-19: 81 mg via ORAL
  Filled 2022-05-18: qty 1

## 2022-05-18 MED ORDER — METFORMIN HCL ER 500 MG PO TB24: 1000.0000 mg | ORAL_TABLET | Freq: Every day | ORAL | Status: DC

## 2022-05-18 MED ORDER — AZITHROMYCIN 500 MG PO TABS
500.0000 mg | ORAL_TABLET | Freq: Every day | ORAL | Status: DC
  Administered 2022-05-18 – 2022-05-19 (×2): 500 mg via ORAL
  Filled 2022-05-18 (×2): qty 1

## 2022-05-18 MED ORDER — IPRATROPIUM-ALBUTEROL 0.5-2.5 (3) MG/3ML IN SOLN
3.0000 mL | Freq: Once | RESPIRATORY_TRACT | Status: AC
  Administered 2022-05-18: 3 mL via RESPIRATORY_TRACT
  Filled 2022-05-18: qty 3

## 2022-05-18 MED ORDER — ATORVASTATIN CALCIUM 40 MG PO TABS
40.0000 mg | ORAL_TABLET | Freq: Every day | ORAL | Status: DC
  Administered 2022-05-19: 40 mg via ORAL
  Filled 2022-05-18: qty 1

## 2022-05-18 MED ORDER — INSULIN ASPART 100 UNIT/ML IJ SOLN: 0.0000 [IU] | Freq: Three times a day (TID) | INTRAMUSCULAR | Status: DC

## 2022-05-18 MED ORDER — NICOTINE 14 MG/24HR TD PT24
14.0000 mg | MEDICATED_PATCH | Freq: Every day | TRANSDERMAL | Status: DC
  Administered 2022-05-18 – 2022-05-19 (×2): 14 mg via TRANSDERMAL
  Filled 2022-05-18 (×2): qty 1

## 2022-05-18 MED ORDER — MELATONIN 5 MG PO TABS
5.0000 mg | ORAL_TABLET | Freq: Every day | ORAL | Status: DC
  Administered 2022-05-19: 5 mg via ORAL
  Filled 2022-05-18: qty 1

## 2022-05-18 MED ORDER — IPRATROPIUM-ALBUTEROL 0.5-2.5 (3) MG/3ML IN SOLN: 3.0000 mL | RESPIRATORY_TRACT | Status: DC | PRN

## 2022-05-18 MED ORDER — ENOXAPARIN SODIUM 80 MG/0.8ML IJ SOSY
0.5000 mg/kg | PREFILLED_SYRINGE | INTRAMUSCULAR | Status: DC
  Administered 2022-05-19: 70 mg via SUBCUTANEOUS
  Filled 2022-05-18: qty 0.7

## 2022-05-18 NOTE — Progress Notes (Signed)
FMTS Brief Progress Note  S: Patient states he is feeling much better than this morning.  Breathing comfortably on 2 L.  Denies any chest pain, denies shortness of breath on 2 L, denies body aches and chills.  States he feels similar to the way he felt when he had COVID a couple years ago.   O: BP (!) 163/138 (BP Location: Right Arm)   Pulse 72   Temp 98.4 F (36.9 C) (Oral)   Resp 18   Ht '5\' 11"'$  (1.803 m)   Wt (!) 139.3 kg   SpO2 97%   BMI 42.82 kg/m   General: 62 year old male, pleasant to speak with, NAD Cardio: RRR, normal S1/S2 Respiratory: Expiratory wheezes in upper lung fields, good air movement throughout  A/P COVID-19 infection Patient tested positive for COVID-19 today.  He is stable on 2 L via nasal cannula.  He continues to be afebrile. Will consider starting remdesivir in the morning.  COPD exacerbation It is unclear if patient's respiratory symptoms are solely due to COVID-19 or if COVID-19 has led to COPD exacerbation.  Will continue with plan from day team and treat with DuoNebs as needed, steroids and azithromycin.  Precious Gilding, DO 05/18/2022, 10:53 PM PGY-2,  Family Medicine Night Resident  Please page [phone] with questions.

## 2022-05-18 NOTE — ED Triage Notes (Signed)
Shortness of breath with productive cough since yesterday. 83% on RA. EMS gave '5mg'$  albuterol and 0.5 mg atrovent. Smoker. Hx of asthma.

## 2022-05-18 NOTE — ED Notes (Signed)
ED TO INPATIENT HANDOFF REPORT  ED Nurse Name and Phone #: Fishel Wamble [phone]  S Name/Age/Gender Levi Blankenship Levi Blankenship 62 y.o. male Room/Bed: 019C/019C  Code Status   Code Status: Full Code  Home/SNF/Other Home Patient oriented to: self, place, time, and situation Is this baseline? Yes   Triage Complete: Triage complete  Chief Complaint COPD exacerbation ([place]) [J44.1]  Triage Note Shortness of breath with productive cough since yesterday. 83% on RA. EMS gave '5mg'$  albuterol and 0.5 mg atrovent. Smoker. Hx of asthma.    Allergies No Known Allergies  Level of Care/Admitting Diagnosis ED Disposition     ED Disposition  Admit   Condition  --   Comment  Hospital Area: [facility] [id number]  Level of Care: Med-Surg [16]  May place patient in observation at [facility] or [place] if equivalent level of care is available:: No  Covid Evaluation: Asymptomatic - no recent exposure (last 10 days) testing not required  Diagnosis: COPD exacerbation [facility]) [id number]  Admitting Physician: Salvadore Oxford [id number]  Attending Physician: Kinnie Feil [[id number]]          B Medical/Surgery History Past Medical History:  Diagnosis Date   Blood in stool    OVER A MONTH AGO PER PT WIFE ON 11-27-2020   Claustrophobia 11/27/2020   COVID    SPRING 2022 COUGH CHILLS SOB X 4 DAYS ALL SYMPTOMS REOLVED   Dyspnea    OCC DUE TO ACID REFLUX   GERD (gastroesophageal reflux disease)    Hypertension    MILD ANXIETY 11/27/2020   NO MEDS TAKEN   MILD DEPRESSION 11/27/2020   NO MEDS TAKEN   Mild intermittent asthma without complication [id number]   New onset type 2 diabetes mellitus ([place]) 12/01/2018   Prolapsed hemorrhoids 11/27/2020   Rotator cuff tear, left    DONE SEVERAL MNTHS AGO, PER PT WIFE ON 11-27-2020 HAS PAIN WITH CAN MOVE ARM OK   Sleep apnea    not currently using c-pap   Urinary frequency 11/27/2020   Wears partial dentures 11/27/2020   UPPER AND LOWER    Past Surgical History:  Procedure Laterality Date   COLONSCOPY  10/10/2020   LABAUER GI WITH PRECANCEROUS AREAS REMOVED   EVALUATION UNDER ANESTHESIA WITH HEMORRHOIDECTOMY N/A 11/30/2020   Procedure: EXCISION OF PROLAPSING ANAL MASS, HEMORRHOIDECTOMY, HEMORRHOIDOPEXY, ANORECTAL EXAMINATION UNDER ANESTHESIA;  Surgeon: Michael Boston, MD;  Location: [place];  Service: General;  Laterality: N/A;  GEN & LOCAL   TONSILLECTOMY     MORE THAN 20 YRS AGO, ADDENOIDS REMOVED ALSO   tumor neck     benign and removed 15 YRS AGO     A IV Location/Drains/Wounds Patient Lines/Drains/Airways Status     Active Line/Drains/Airways     Name Placement date Placement time Site Days   Peripheral IV 05/18/22 20 G Left Antecubital 05/18/22  0811  Antecubital  less than 1            Intake/Output Last 24 hours No intake or output data in the 24 hours ending 05/18/22 1732  Labs/Imaging Results for orders placed or performed during the hospital encounter of 05/18/22 (from the past 48 hour(s))  Brain natriuretic peptide     Status: None   Collection Time: 05/18/22  8:12 AM  Result Value Ref Range   B Natriuretic Peptide 92.3 0.0 - 100.0 pg/mL    Comment: Performed at [facility], [address] [place], [place] [address]  Troponin I (High Sensitivity)  Status: Abnormal   Collection Time: 05/18/22  8:12 AM  Result Value Ref Range   Troponin I (High Sensitivity) 20 (H) <18 ng/L    Comment: (NOTE) Elevated high sensitivity troponin I (hsTnI) values and significant  changes across serial measurements may suggest ACS but many other  chronic and acute conditions are known to elevate hsTnI results.  Refer to the "Links" section for chest pain algorithms and additional  guidance. Performed at [facility], [place] [address] [place], [place] [address]   Troponin I (High Sensitivity)     Status: Abnormal   Collection Time: 05/18/22 10:33 AM  Result Value Ref Range    Troponin I (High Sensitivity) 29 (H) <18 ng/L    Comment: (NOTE) Elevated high sensitivity troponin I (hsTnI) values and significant  changes across serial measurements may suggest ACS but many other  chronic and acute conditions are known to elevate hsTnI results.  Refer to the "Links" section for chest pain algorithms and additional  guidance. Performed at [facility], [place] [address], [place], [place] [address]   I-stat chem 8, ED (not at [facility], [facility] or [facility])     Status: Abnormal   Collection Time: 05/18/22 11:54 AM  Result Value Ref Range   Sodium 138 135 - 145 mmol/L   Potassium 3.8 3.5 - 5.1 mmol/L   Chloride 96 (L) 98 - 111 mmol/L   BUN 8 8 - 23 mg/dL   Creatinine, Ser 1.00 0.61 - 1.24 mg/dL   Glucose, Bld 127 (H) 70 - 99 mg/dL    Comment: Glucose reference range applies only to samples taken after fasting for at least 8 hours.   Calcium, Ion 1.12 (L) 1.15 - 1.40 mmol/L   TCO2 33 (H) 22 - 32 mmol/L   Hemoglobin 15.3 13.0 - 17.0 g/dL   HCT 45.0 39.0 - 52.0 %   CT Angio Chest PE W and/or Wo Contrast  Result Date: 05/18/2022 CLINICAL DATA:  Shortness of breath EXAM: CT ANGIOGRAPHY CHEST WITH CONTRAST TECHNIQUE: Multidetector CT imaging of the chest was performed using the standard protocol during bolus administration of intravenous contrast. Multiplanar CT image reconstructions and MIPs were obtained to evaluate the vascular anatomy. RADIATION DOSE REDUCTION: This exam was performed according to the departmental dose-optimization program which includes automated exposure control, adjustment of the mA and/or kV according to patient size and/or use of iterative reconstruction technique. CONTRAST:  30m OMNIPAQUE IOHEXOL 350 MG/ML SOLN COMPARISON:  Chest x-ray earlier 05/18/2022. Report CT angiogram chest 11/22/2018. Standard chest CT 2017 March FINDINGS: Cardiovascular: Extensive breathing motion identified particularly along the mid to lower lung zones. There is some enlargement of  the main pulmonary artery. Please correlate for evidence of pulmonary artery hypertension. With the motion evaluation of small and peripheral emboli are nondiagnostic. No large or central embolus. The heart enlarged. Trace pericardial fluid. The thoracic aorta overall has a normal course and caliber. Slight calcifications along the area of the aortic valve. Mediastinum/Nodes: No specific abnormal lymph node enlargement identified in the axillary regions, hilum or mediastinum. Only a few small less than 1 cm in size in short axis left hilar nodes are seen, not pathologic by size criteria. Normal caliber thoracic esophagus. Lungs/Pleura: No consolidation, pneumothorax or effusion. Along the lower lobe series bronchial wall thickening and some subtle debris as well as some interstitial change. Acute process is possible. Subtle changes as well in the middle lobe and lingula. Upper Abdomen: Along the visualized upper abdomen there is  a left hepatic lobe cyst, unchanged from previous exams. The adrenal glands are incompletely included in the imaging field. Musculoskeletal: Slight curvature of the spine with some degenerative change. Review of the MIP images confirms the above findings. IMPRESSION: 1. Extensive breathing motion identified particularly along the mid to lower lung zones. With the motion evaluation of small and peripheral emboli are nondiagnostic. No large or central embolus identified. 2. Enlargement of the main pulmonary artery. Please correlate for evidence of pulmonary artery hypertension. 3. Lower lung areas of bronchial wall thickening and subtle debris. Acute process is possible. Aortic Atherosclerosis (ICD10-I70.0). Electronically Signed   By: Jill Side M.D.   On: 05/18/2022 14:33   DG Chest 2 View  Result Date: 05/18/2022 CLINICAL DATA:  Shortness of breath EXAM: CHEST - 2 VIEW COMPARISON:  Chest x-ray May 03, 2022 FINDINGS: The heart size and mediastinal contours are within normal limits.  Both lungs are clear. The visualized skeletal structures are unremarkable. IMPRESSION: No active cardiopulmonary disease. Electronically Signed   By: Dorise Bullion III M.D.   On: 05/18/2022 08:48    Pending Labs Unresulted Labs (From admission, onward)     Start     Ordered   05/19/22 [id number]  Basic metabolic panel  Tomorrow morning,   R        05/18/22 1726   05/19/22 0500  CBC  Tomorrow morning,   R        05/18/22 1726   05/19/22 0500  Hemoglobin A1c  Tomorrow morning,   R       Comments: To assess prior glycemic control    05/18/22 1726   05/19/22 0500  HIV Antibody (routine testing w rflx)  (HIV Antibody (Routine testing w reflex) panel)  Tomorrow morning,   R        05/18/22 1729            Vitals/Pain Today's Vitals   05/18/22 1200 05/18/22 1330 05/18/22 1430 05/18/22 1547  BP: (!) 171/115 (!) 104/91 (!) 179/109   Pulse: 63 (!) 44 (!) 109   Resp: (!) 26 (!) 21 (!) 21   Temp:    98.4 F (36.9 C)  TempSrc:    Oral  SpO2: 92% 93% 97%   Weight:      Height:      PainSc:        Isolation Precautions No active isolations  Medications Medications  aspirin EC tablet 81 mg (has no administration in time range)  atorvastatin (LIPITOR) tablet 40 mg (has no administration in time range)  felodipine (PLENDIL) 24 hr tablet 10 mg (has no administration in time range)  lisinopril (ZESTRIL) tablet 10 mg (has no administration in time range)  metFORMIN (GLUCOPHAGE-XR) 24 hr tablet 1,000 mg (has no administration in time range)  pantoprazole (PROTONIX) EC tablet 40 mg (has no administration in time range)  enoxaparin (LOVENOX) injection 40 mg (has no administration in time range)  nicotine (NICODERM CQ - dosed in mg/24 hours) patch 14 mg (has no administration in time range)  ipratropium-albuterol (DUONEB) 0.5-2.5 (3) MG/3ML nebulizer solution 3 mL (has no administration in time range)  ipratropium-albuterol (DUONEB) 0.5-2.5 (3) MG/3ML nebulizer solution 3 mL (3 mLs  Nebulization Given 05/18/22 0825)  ipratropium-albuterol (DUONEB) 0.5-2.5 (3) MG/3ML nebulizer solution 3 mL (3 mLs Nebulization Given 05/18/22 1228)  methylPREDNISolone sodium succinate (SOLU-MEDROL) 125 mg/2 mL injection 125 mg (125 mg Intravenous Given 05/18/22 1303)  iohexol (OMNIPAQUE) 350 MG/ML injection 75 mL (75 mLs Intravenous Contrast Given  05/18/22 1419)    Mobility walks       R Recommendations: See Admitting Provider Note  Report given to:   Additional Notes:

## 2022-05-18 NOTE — Assessment & Plan Note (Addendum)
Symptoms likely secondary to COPD exacerbation in the setting of COVID-19 infection. Still wheezy on exam with normal WOB. -O2 sats goals for COPD patients 88-92% -Redose a.m. prednisone orally 40 mg for 5 more days -Rx DuoNeb nebulizer every 4 hours as needed for 3 days -Discussed importance of smoking cessation - azithromycin 500 mg for 3 days -Outpatient PFTs -Could benefit from a daily controller -Vitals per floor -Continuous pulse ox -Ambulatory pulse ox

## 2022-05-18 NOTE — ED Notes (Addendum)
Pt ambulated to the bathroom on RA; upon returning to room oxygen was at 86% and pt was experiencing SOB and diaphoretic. Pt placed back on 2Lnc and oxygen returned to 94%.

## 2022-05-18 NOTE — ED Provider Notes (Signed)
[facility] Provider Note   CSN: [id number] Arrival date & time: 05/18/22  [id number]     History  Chief Complaint  Patient presents with   Shortness of Breath   Levi Blankenship is a 62 y.o. male.  Levi Blankenship is a 62 year old with history of asthma, tobacco use disorder, type 2 diabetes, hypertension, OSA who presents to the ED complaining of 2 to 3 days of shortness of breath (worsening last night) with associated dry cough and orthopnea (reports having to sit up more but still using 2 pillows at night). Per triage note he was BIB EMS, initial EMS vitals 83% on RA and he received albuterol and atrovent. Has known OSA per PMHx but does not have CPAP.  Denies fevers, N/V, abdominal discomfort. He does have chest wall soreness from coughing for which he has been taking Tylenol. He has been using his albuterol inhaler for his SOB.   He was last seen in the ED on 2/16 diagnosed with COPD exacerbation and discharged with albuterol nebulizer, duoneb, 5 day course of augmentin which he has completed. He does endorse feeling better after previous treatment but then his symptoms returned. He is concerned he may have pneumonia. He has had his COVID and pneumonia vaccines but not the influenza vaccine.       Home Medications Prior to Admission medications   Medication Sig Start Date End Date Taking? Authorizing Provider  albuterol (VENTOLIN HFA) 108 (90 Base) MCG/ACT inhaler Inhale 2 puffs into the lungs every 6 (six) hours as needed for wheezing or shortness of breath. 05/03/22   Cristie Hem, MD  aspirin EC 81 MG tablet Take 81 mg by mouth daily.    [provider]  atorvastatin (LIPITOR) 40 MG tablet Take by mouth daily. 11/24/19 01/31/22  [provider]  cetirizine (ZYRTEC) 10 MG tablet Take 10 mg by mouth as needed.    [provider]  Cetirizine HCl (ZERVIATE) 0.24 % SOLN Apply 2 drops to eye 2 (two) times daily. Patient  taking differently: Apply 2 drops to eye as needed. 08/24/19   Valentina Shaggy, MD  diphenhydrAMINE (BENADRYL) 25 MG tablet Take 25 mg by mouth every 6 (six) hours as needed.    [provider]  felodipine (PLENDIL) 10 MG 24 hr tablet Take 10 mg by mouth daily.    [provider]  lisinopril (PRINIVIL,ZESTRIL) 10 MG tablet Take 10 mg by mouth daily.  03/03/12   [provider]  loratadine (CLARITIN) 10 MG tablet Take 10 mg by mouth daily as needed.    [provider]  metFORMIN (GLUMETZA) 1000 MG (MOD) 24 hr tablet Take 1,000 mg by mouth daily with breakfast.    [provider]  omeprazole (PRILOSEC) 40 MG capsule Take by mouth. 01/13/18   [provider]  traMADol (ULTRAM) 50 MG tablet Take 1 tablet (50 mg total) by mouth every 8 (eight) hours as needed. 01/31/22   Lamptey, Myrene Galas, MD      Allergies    Patient has no known allergies.    Review of Systems   Review of Systems  Constitutional:  Negative for fever.  Respiratory:  Positive for cough and shortness of breath.   Gastrointestinal:        +abdominal soreness  Musculoskeletal:        +Chest wall soreness    Physical Exam Updated Vital Signs BP (!) 179/109   Pulse (!) 109  Temp 98.4 F (36.9 C) (Oral)   Resp (!) 21   Ht '5\' 11"'$  (1.803 m)   Wt (!) 140.6 kg   SpO2 97%   BMI 43.24 kg/m  Physical Exam Constitutional:      General: He is not in acute distress.    Appearance: He is well-developed. He is obese.  HENT:     Mouth/Throat:     Mouth: Mucous membranes are moist.  Eyes:     Comments: +Arcus senilis   Neck:     Vascular: JVD present.  Cardiovascular:     Rate and Rhythm: Regular rhythm. Tachycardia present.  Pulmonary:     Effort: Tachypnea present.     Breath sounds: Examination of the right-upper field reveals rhonchi. Examination of the left-upper field reveals rhonchi. Examination of the right-middle field reveals rhonchi. Examination of the  left-middle field reveals rhonchi. Examination of the right-lower field reveals rhonchi. Examination of the left-lower field reveals rhonchi. Rhonchi present.     Comments: + diffuse rhonchi Abdominal:     Comments: + distended, non-tender in all quadrants  Musculoskeletal:        General: Normal range of motion.     Cervical back: Neck supple.  Skin:    General: Skin is warm.  Neurological:     Mental Status: He is alert.    ED Results / Procedures / Treatments   Labs (all labs ordered are listed, but only abnormal results are displayed) Labs Reviewed  I-STAT CHEM 8, ED - Abnormal; Notable for the following components:      Result Value   Chloride 96 (*)    Glucose, Bld 127 (*)    Calcium, Ion 1.12 (*)    TCO2 33 (*)    All other components within normal limits  TROPONIN I (HIGH SENSITIVITY) - Abnormal; Notable for the following components:   Troponin I (High Sensitivity) 20 (*)    All other components within normal limits  TROPONIN I (HIGH SENSITIVITY) - Abnormal; Notable for the following components:   Troponin I (High Sensitivity) 29 (*)    All other components within normal limits  BRAIN NATRIURETIC PEPTIDE    EKG EKG Interpretation  Date/Time:  Saturday May 18 2022 07:53:03 EST Ventricular Rate:  111 PR Interval:  159 QRS Duration: 106 QT Interval:  366 QTC Calculation: 498 R Axis:   19 Text Interpretation: Sinus tachycardia Atrial premature complex Nonspecific T abnormalities, lateral leads Borderline prolonged QT interval Abnormal ECG Confirmed by Carmin Muskrat [phone]) on 05/18/2022 9:04:25 AM  Radiology CT Angio Chest PE W and/or Wo Contrast  Result Date: 05/18/2022 CLINICAL DATA:  Shortness of breath EXAM: CT ANGIOGRAPHY CHEST WITH CONTRAST TECHNIQUE: Multidetector CT imaging of the chest was performed using the standard protocol during bolus administration of intravenous contrast. Multiplanar CT image reconstructions and MIPs were obtained to evaluate the  vascular anatomy. RADIATION DOSE REDUCTION: This exam was performed according to the departmental dose-optimization program which includes automated exposure control, adjustment of the mA and/or kV according to patient size and/or use of iterative reconstruction technique. CONTRAST:  71m OMNIPAQUE IOHEXOL 350 MG/ML SOLN COMPARISON:  Chest x-ray earlier 05/18/2022. Report CT angiogram chest 11/22/2018. Standard chest CT 2017 March FINDINGS: Cardiovascular: Extensive breathing motion identified particularly along the mid to lower lung zones. There is some enlargement of the main pulmonary artery. Please correlate for evidence of pulmonary artery hypertension. With the motion evaluation of small and peripheral emboli are nondiagnostic. No large or central embolus. The heart  enlarged. Trace pericardial fluid. The thoracic aorta overall has a normal course and caliber. Slight calcifications along the area of the aortic valve. Mediastinum/Nodes: No specific abnormal lymph node enlargement identified in the axillary regions, hilum or mediastinum. Only a few small less than 1 cm in size in short axis left hilar nodes are seen, not pathologic by size criteria. Normal caliber thoracic esophagus. Lungs/Pleura: No consolidation, pneumothorax or effusion. Along the lower lobe series bronchial wall thickening and some subtle debris as well as some interstitial change. Acute process is possible. Subtle changes as well in the middle lobe and lingula. Upper Abdomen: Along the visualized upper abdomen there is a left hepatic lobe cyst, unchanged from previous exams. The adrenal glands are incompletely included in the imaging field. Musculoskeletal: Slight curvature of the spine with some degenerative change. Review of the MIP images confirms the above findings. IMPRESSION: 1. Extensive breathing motion identified particularly along the mid to lower lung zones. With the motion evaluation of small and peripheral emboli are  nondiagnostic. No large or central embolus identified. 2. Enlargement of the main pulmonary artery. Please correlate for evidence of pulmonary artery hypertension. 3. Lower lung areas of bronchial wall thickening and subtle debris. Acute process is possible. Aortic Atherosclerosis (ICD10-I70.0). Electronically Signed   By: Jill Side M.D.   On: 05/18/2022 14:33   DG Chest 2 View  Result Date: 05/18/2022 CLINICAL DATA:  Shortness of breath EXAM: CHEST - 2 VIEW COMPARISON:  Chest x-ray May 03, 2022 FINDINGS: The heart size and mediastinal contours are within normal limits. Both lungs are clear. The visualized skeletal structures are unremarkable. IMPRESSION: No active cardiopulmonary disease. Electronically Signed   By: Dorise Bullion III M.D.   On: 05/18/2022 08:48    Procedures Procedures    Medications Ordered in ED Medications  ipratropium-albuterol (DUONEB) 0.5-2.5 (3) MG/3ML nebulizer solution 3 mL (3 mLs Nebulization Given 05/18/22 0825)  ipratropium-albuterol (DUONEB) 0.5-2.5 (3) MG/3ML nebulizer solution 3 mL (3 mLs Nebulization Given 05/18/22 1228)  methylPREDNISolone sodium succinate (SOLU-MEDROL) 125 mg/2 mL injection 125 mg (125 mg Intravenous Given 05/18/22 1303)  iohexol (OMNIPAQUE) 350 MG/ML injection 75 mL (75 mLs Intravenous Contrast Given 05/18/22 1419)    ED Course/ Medical Decision Making/ A&P                             Medical Decision Making 62 y/o M with hx of asthma and tobacco use disorder, no formal dx of COPD, who presents with 2-3 days of SOB that worsened yesterday with associated dry cough. Recently treated for COPD/asthma exacerbation. Initial vitals notable for slight hypothermia 36F, tachypnea and tachycardia. Initial examination notable for NAD or obvious work of breathing. Mild JVD, diffuse rhonchi in b/l lungs, SpO2 >92% on RA.  Initial differential includes asthma exacerbation, PNA, CHF exacerbation, ACS. Will obtain troponin, BNP, chest-xray. Will give  duoneb.   8:59 AM back from radiology. CXR negative. Awaiting trop. Patient diaphoretic- reports started after another coughing episode. No chest pain and reports abdominal soreness has resolved. Feels improved compared to when he first came in. He is now on 2L [place] due to his oxygen saturation dipping to low 90s, maintaining saturations around 93-94% on 2L [place]. Lung examination slightly improved compared to initial examination.   9:19 AM initial troponin returned at 20, stable from 2 weeks ago. Suspect demand ischamia but will await repeat troponin.   10:22 AM BNP stable. On re-evaluation oxygen weaned to  RA, able to maintain saturations >90%.   10:45 AM RN reports desaturations to 86-87%, will place back on 1L [place]. Second troponin in process.   11:30 AM repeat trop 29, less than 10 point delta increase. Suspect demand ischemia. In to re-evaluate patient, he was sitting at edge of bed, still complaining of intermittent diaphoresis and SOB. Reporting dyspnea on exertion which he states is abnormal for him. Still on 1 L [place]. No chest pain or pain otherwise. Wells score 4.5, moderate risk. Will check CTA chest. Anticipate admission given new oxygen requirement, continued SOB, DOE.   12:26 PM Istat chem 8 returned with stable renal function. Still awaiting CTA.  12:51 PM still on oxygen, and tachypnic. Subjectively feels slightly improved after additional breathing treatment. Will give a dose of solu-medrol. Still awaiting CTA chest. Patient updated on plan.   3:13 PM re-evaluated patient. CTA negative though motion degraded. Patient reports feeling improved. He was on 3L [place] in the room maintaining saturations >95%. Weaned him to RA and he maintained low 90s but when getting up and pacing around room his oxygen level dropped to 86-87%. Still tachycardic. Lungs sound tight but patient reports SOB improved. Discussed admission given continued oxygen requirement, possible COPD exacerbation.   4:13 PM  discussed with family medicine teaching service who will admit.   Amount and/or Complexity of Data Reviewed Labs: ordered. Radiology: ordered and independent interpretation performed.    Details: CXR negative.  ECG/medicine tests: ordered and independent interpretation performed.  Risk Prescription drug management.         Final Clinical Impression(s) / ED Diagnoses Final diagnoses:  Shortness of breath   Rx / DC Orders ED Discharge Orders     None        Sharion Settler, DO 05/18/22 1618    Carmin Muskrat, MD 05/18/22 [phone]

## 2022-05-18 NOTE — Assessment & Plan Note (Addendum)
A1c per chart review on 10/23/2021 at 7.2.  Creatinine stable. -Continue metformin 1000 mg daily -CBGs with meals and at night -Consider addition of sliding scale

## 2022-05-18 NOTE — Plan of Care (Signed)

## 2022-05-18 NOTE — Plan of Care (Signed)
  Problem: Education: Goal: Ability to describe self-care measures that may prevent or decrease complications (Diabetes Survival Skills Education) will improve 05/18/2022 1844 by Anastasio Auerbach, RN Outcome: Progressing 05/18/2022 1844 by Anastasio Auerbach, RN Outcome: Progressing Goal: Individualized Educational Video(s) 05/18/2022 1844 by Anastasio Auerbach, RN Outcome: Progressing 05/18/2022 1844 by Anastasio Auerbach, RN Outcome: Progressing   Problem: Coping: Goal: Ability to adjust to condition or change in health will improve 05/18/2022 1844 by Anastasio Auerbach, RN Outcome: Progressing 05/18/2022 1844 by Anastasio Auerbach, RN Outcome: Progressing   Problem: Fluid Volume: Goal: Ability to maintain a balanced intake and output will improve 05/18/2022 1844 by Anastasio Auerbach, RN Outcome: Progressing 05/18/2022 1844 by Anastasio Auerbach, RN Outcome: Progressing   Problem: Health Behavior/Discharge Planning: Goal: Ability to identify and utilize available resources and services will improve 05/18/2022 1844 by Anastasio Auerbach, RN Outcome: Progressing 05/18/2022 1844 by Anastasio Auerbach, RN Outcome: Progressing Goal: Ability to manage health-related needs will improve 05/18/2022 1844 by Anastasio Auerbach, RN Outcome: Progressing 05/18/2022 1844 by Anastasio Auerbach, RN Outcome: Progressing   Problem: Metabolic: Goal: Ability to maintain appropriate glucose levels will improve 05/18/2022 1844 by Anastasio Auerbach, RN Outcome: Progressing 05/18/2022 1844 by Anastasio Auerbach, RN Outcome: Progressing   Problem: Nutritional: Goal: Maintenance of adequate nutrition will improve 05/18/2022 1844 by Anastasio Auerbach, RN Outcome: Progressing 05/18/2022 1844 by Anastasio Auerbach, RN Outcome: Progressing Goal: Progress toward achieving an optimal weight will improve 05/18/2022 1844 by Anastasio Auerbach, RN Outcome: Progressing 05/18/2022 1844 by Anastasio Auerbach, RN Outcome: Progressing   Problem: Skin Integrity: Goal: Risk for impaired skin  integrity will decrease 05/18/2022 1844 by Anastasio Auerbach, RN Outcome: Progressing 05/18/2022 1844 by Anastasio Auerbach, RN Outcome: Progressing   Problem: Tissue Perfusion: Goal: Adequacy of tissue perfusion will improve 05/18/2022 1844 by Anastasio Auerbach, RN Outcome: Progressing 05/18/2022 1844 by Anastasio Auerbach, RN Outcome: Progressing   Problem: Education: Goal: Knowledge of disease or condition will improve Outcome: Progressing Goal: Knowledge of the prescribed therapeutic regimen will improve Outcome: Progressing Goal: Individualized Educational Video(s) Outcome: Progressing   Problem: Activity: Goal: Ability to tolerate increased activity will improve Outcome: Progressing Goal: Will verbalize the importance of balancing activity with adequate rest periods Outcome: Progressing   Problem: Respiratory: Goal: Ability to maintain a clear airway will improve Outcome: Progressing Goal: Levels of oxygenation will improve Outcome: Progressing Goal: Ability to maintain adequate ventilation will improve Outcome: Progressing

## 2022-05-18 NOTE — Assessment & Plan Note (Signed)
Patient is interested in smoking cessation. -TOC consult -Nicotine patch 14 mg daily

## 2022-05-18 NOTE — H&P (Cosign Needed Addendum)
Hospital Admission History and Physical Service Pager: [phone]  Patient name: Levi Blankenship. [place] record number: [id number] Date of Birth: 02/05/1961 Age: 62 y.o. Gender: male  Primary Care Provider: Venida Jarvis, MD Consultants: None Code Status: Full code  Preferred Emergency Contact:  Contact Information     Name Relation Home Work [place] [phone]          Chief Complaint: Shortness of breath  Assessment and Plan: Levi Blankenship is a 62 y.o. male presenting with shortness of breath and new cough. Differential for this patient's presentation of this includes COPD/asthma exacerbation, new onset CHF, PE, pneumonia.  Is most likely mixed asthma/COPD exacerbation as has history of asthma but no formal diagnosis of COPD in the setting of chronic smoking as well as clinical presentation of COPD exacerbation with productive white sputum cough and shortness of breath.  Patient denies orthopnea, lower extremity swelling, and chest x-ray negative for pulmonary edema making heart failure less likely.  CTA in ED negative for PE.  Pneumonia less likely without fevers, elevated white count or clinical findings.  * Chronic obstructive pulmonary disease with acute exacerbation (HCC) Patient's history and exam findings are consistent with COPD exacerbations in the setting of current continued tobacco use.  O2 sats show 80% on room air and on exam has decreased air movement and expiratory wheezes and mild increased work of breathing.  -O2 sats goals for COPD patients 88-92% -Redose a.m. prednisone orally 40 mg for 5 more days -Rx DuoNeb nebulizer every 4 hours as needed for 3 days -Discussed importance of smoking cessation -Consider azithromycin 500 mg for 3 days -Outpatient PFTs -Could benefit from a daily controller -Vitals per floor -Follow up Respiratory panel -Continuous pulse ox   Tobacco use Patient is interested in smoking cessation. -TOC  consult -Nicotine patch 14 mg daily  Type 2 diabetes mellitus ([place]) A1c per chart review on 10/23/2021 at 7.2.  Creatinine stable. -Continue metformin 1000 mg daily -CBGs with meals and at night -Consider addition of sliding scale    FEN/GI: Heart healty/carb modified diet VTE Prophylaxis: Lovenox  Disposition: Pending clinical improvement  History of Present Illness:  Levi Blankenship is a 62 y.o. male presenting with worsening shortness of breath.  Reports SOB with minimal activity,  worsening SOB in last 3 days and last night was worse prompting visit to nthe ED. Pt said he was "short wiinded" just showering and got concerned which made him ask his wife to call 911. Patient report he has been coughing as well with tightness around his ribs which started 3 days ago as well. Symptoms were similar to last visit to the ED where he was treated for bronchitis. Albuterol provided some relive but he ran out last night. Reports normal BM. No runninmg nose, LE swelling, chest pain.  Does endorse some abdominal tightness.  No nausea or vomiting, no fevers.  Patient did uses home albuterol inhaler several times but while this helped initially continue to worsen. Patient report hx of sleep apnea and supposed to be on CPAP machine but not using due to claustrophobia.   Of note patient was recently admitted on 04/1618 24 to the ED and diagnosed with COPD exacerbation.  Presented similarly with shortness of breath and cough.  Patient's symptoms resolved with antibiotics, DuoNebs and Solu-Medrol at that time.  Was given course of Augmentin at that time.  In the ED, vitals were notable for tachycardia, hypertension, tachypnea, oxygen saturation 90%  on 1 L low flow nasal cannula.  Labs significant for creatinine 1.12 bicarb elevated at 33, troponin 29, BNP 92.3.  Chest x-ray showing no active disease.  CTA was negative for acute PE.  Patient received DuoNebs x 2 and 1 dose of IV Solu-Medrol.  Review Of  Systems: Per HPI.  Pertinent Past Medical History: Hypertension GERD Mild intermittent asthma Type 2 diabetes Obstructive Sleep apnea suppose to be on CPAP Remainder reviewed in history tab.   Pertinent Past Surgical History: Tonsillectomy Hemorrhoidectomy Remainder reviewed in history tab.   Pertinent Social History: Tobacco use: Yes- 2 packs a week for over 28yr Alcohol use: Beer occasionally Other Substance use: Marijuana occasionally Lives with wife  Pertinent Family History: Maternal grandmother- stomach cancer  Maternal Grandfather- prostate cancer Dad- T2DM Remainder reviewed in history tab.   Important Outpatient Medications: Albuterol Aspirin Lipitor Cetirizine Benadryl Filodipine Lisinopril Loratadine Metformin Omeprazole Tramadol Remainder reviewed in medication history.   Objective: BP (!) 163/90 (BP Location: Right Arm)   Pulse 72   Temp 98 F (36.7 C) (Oral)   Resp 17   Ht '5\' 11"'$  (1.803 m)   Wt (!) 139.3 kg   SpO2 96%   BMI 42.82 kg/m  Exam: General: A&O, NAD, lying comfortably in hospital bed HEENT: No sign of trauma, EOM grossly intact, moist mucous membranes Cardiac: RRR, no m/r/g Respiratory: CTAB, normal WOB, no w/c/r GI: Soft, NTTP, non-distended, no rebound or guarding Extremities: NTTP, no peripheral edema. Neuro: Moves all four extremities appropriately. Psych: Appropriate mood and affect   Labs:  CBC BMET  Recent Labs  Lab 05/18/22 1154  HGB 15.3  HCT 45.0   Recent Labs  Lab 05/18/22 1154  NA 138  K 3.8  CL 96*  BUN 8  CREATININE 1.00  GLUCOSE 127*     Pertinent additional labs: BMP 92.30, troponin 20.   EKG: My own interpretation: sinus tachycardia, non specific lateral lead t wave abnormalities   Imaging Studies Performed: CTA 1. Extensive breathing motion identified particularly along the mid to lower lung zones. With the motion evaluation of small and peripheral emboli are nondiagnostic. No large or  central embolus identified. 2. Enlargement of the main pulmonary artery. Please correlate for evidence of pulmonary artery hypertension. 3. Lower lung areas of bronchial wall thickening and subtle debris. Acute process is possible.  CXR Impression from Radiologist: No active cardiopulmonary disease My Interpretation: Possible right lower lobe opacity, likely artifact   QSalvadore Oxford MD 05/18/2022, 8:21 PM PGY-1, [facility] pager: [phone] text pages welcome Secure chat group [place]

## 2022-05-18 NOTE — ED Notes (Signed)
Patient transported to X-ray 

## 2022-05-19 ENCOUNTER — Observation Stay (HOSPITAL_BASED_OUTPATIENT_CLINIC_OR_DEPARTMENT_OTHER): Payer: BLUE CROSS/BLUE SHIELD

## 2022-05-19 DIAGNOSIS — U071 COVID-19: Secondary | ICD-10-CM

## 2022-05-19 DIAGNOSIS — R0602 Shortness of breath: Secondary | ICD-10-CM

## 2022-05-19 DIAGNOSIS — J441 Chronic obstructive pulmonary disease with (acute) exacerbation: Secondary | ICD-10-CM

## 2022-05-19 DIAGNOSIS — R0609 Other forms of dyspnea: Secondary | ICD-10-CM | POA: Diagnosis not present

## 2022-05-19 LAB — GLUCOSE, CAPILLARY
Glucose-Capillary: 134 mg/dL — ABNORMAL HIGH (ref 70–99)
Glucose-Capillary: 141 mg/dL — ABNORMAL HIGH (ref 70–99)

## 2022-05-19 LAB — ECHOCARDIOGRAM COMPLETE
Height: 71 in
S' Lateral: 4.4 cm
Weight: 4912 oz

## 2022-05-19 LAB — BASIC METABOLIC PANEL
Anion gap: 12 (ref 5–15)
BUN: 8 mg/dL (ref 8–23)
CO2: 29 mmol/L (ref 22–32)
Calcium: 9.1 mg/dL (ref 8.9–10.3)
Chloride: 99 mmol/L (ref 98–111)
Creatinine, Ser: 1 mg/dL (ref 0.61–1.24)
GFR, Estimated: >60 mL/min (ref >60)
Glucose, Bld: 118 mg/dL — ABNORMAL HIGH (ref 70–99)
Potassium: 3.9 mmol/L (ref 3.5–5.1)
Sodium: 140 mmol/L (ref 135–145)

## 2022-05-19 LAB — CBC
HCT: 46.3 % (ref 39.0–52.0)
Hemoglobin: 14.6 g/dL (ref 13.0–17.0)
MCH: 29.7 pg (ref 26.0–34.0)
MCHC: 31.5 g/dL (ref 30.0–36.0)
MCV: 94.1 fL (ref 80.0–100.0)
Platelets: 309 10*3/uL (ref 150–400)
RBC: 4.92 MIL/uL (ref 4.22–5.81)
RDW: 13.8 % (ref 11.5–15.5)
WBC: 13.3 10*3/uL — ABNORMAL HIGH (ref 4.0–10.5)
nRBC: 0 % (ref 0.0–0.2)

## 2022-05-19 LAB — HIV ANTIBODY (ROUTINE TESTING W REFLEX): HIV Screen 4th Generation wRfx: NONREACTIVE

## 2022-05-19 MED ORDER — FELODIPINE ER 5 MG PO TB24: 5.0000 mg | ORAL_TABLET | Freq: Every day | ORAL | Status: DC

## 2022-05-19 MED ORDER — NIRMATRELVIR/RITONAVIR (PAXLOVID)TABLET
3.0000 | ORAL_TABLET | Freq: Two times a day (BID) | ORAL | Status: DC
  Administered 2022-05-19: 3 via ORAL
  Filled 2022-05-19: qty 30

## 2022-05-19 MED ORDER — BLOOD GLUCOSE TEST VI STRP: 1.0000 | ORAL_STRIP | Freq: Three times a day (TID) | 0 refills | Status: AC

## 2022-05-19 MED ORDER — UMECLIDINIUM-VILANTEROL 62.5-25 MCG/ACT IN AEPB
1.0000 | INHALATION_SPRAY | Freq: Every day | RESPIRATORY_TRACT | Status: DC
  Filled 2022-05-19: qty 14

## 2022-05-19 MED ORDER — PREDNISONE 20 MG PO TABS
40.0000 mg | ORAL_TABLET | Freq: Every day | ORAL | Status: DC
  Administered 2022-05-19: 40 mg via ORAL
  Filled 2022-05-19: qty 2

## 2022-05-19 MED ORDER — LISINOPRIL 10 MG PO TABS: 10.0000 mg | ORAL_TABLET | Freq: Every day | ORAL | 0 refills | Status: DC

## 2022-05-19 MED ORDER — BLOOD GLUCOSE MONITORING SUPPL DEVI: 1.0000 | Freq: Three times a day (TID) | 0 refills | Status: DC

## 2022-05-19 MED ORDER — LANCETS MISC. MISC: 1.0000 | Freq: Three times a day (TID) | 0 refills | Status: AC

## 2022-05-19 MED ORDER — AZITHROMYCIN 500 MG PO TABS: 500.0000 mg | ORAL_TABLET | Freq: Every day | ORAL | 0 refills | Status: AC

## 2022-05-19 MED ORDER — UMECLIDINIUM-VILANTEROL 62.5-25 MCG/ACT IN AEPB: 1.0000 | INHALATION_SPRAY | Freq: Every day | RESPIRATORY_TRACT | 1 refills | Status: DC

## 2022-05-19 MED ORDER — DEXAMETHASONE 6 MG PO TABS: 6.0000 mg | ORAL_TABLET | Freq: Every day | ORAL | 0 refills | Status: AC

## 2022-05-19 MED ORDER — LANCET DEVICE MISC: 1.0000 | Freq: Three times a day (TID) | 0 refills | Status: AC

## 2022-05-19 MED ORDER — UMECLIDINIUM BROMIDE 62.5 MCG/ACT IN AEPB: 1.0000 | INHALATION_SPRAY | Freq: Every day | RESPIRATORY_TRACT | Status: DC

## 2022-05-19 MED ORDER — NIRMATRELVIR/RITONAVIR (PAXLOVID)TABLET: 3.0000 | ORAL_TABLET | Freq: Two times a day (BID) | ORAL | 0 refills | Status: AC

## 2022-05-19 MED ORDER — IPRATROPIUM-ALBUTEROL 0.5-2.5 (3) MG/3ML IN SOLN: 3.0000 mL | RESPIRATORY_TRACT | 1 refills | Status: AC | PRN

## 2022-05-19 MED ORDER — FELODIPINE ER 5 MG PO TB24: 5.0000 mg | ORAL_TABLET | Freq: Every day | ORAL | 0 refills | Status: DC

## 2022-05-19 MED ORDER — DEXAMETHASONE 6 MG PO TABS: 6.0000 mg | ORAL_TABLET | Freq: Every day | ORAL | Status: DC

## 2022-05-19 NOTE — Progress Notes (Signed)
Pt. Ambulated in hallway on R/A, pt. Experiencing some shortness of breath and fatigue during ambulation, O2 sat 90-91 percent, HR 110, pt. Settled back in room and placed on 1L, O2 sat 94% on 1 L O2 and HR 108, no distress noted  Anastasio Auerbach

## 2022-05-19 NOTE — Progress Notes (Addendum)
     Daily Progress Note Intern Pager: [phone]  Patient name: Levi Blankenship. [place] record number: [id number] Date of birth: 1960-10-09 Age: 62 y.o. Gender: male  Primary Care Provider: Venida Jarvis, MD Consultants: None Code Status: Full  Pt Overview and Major Events to Date:  3/2-admitted  Assessment and Plan: Levi Blankenship is a 62 y.o. male presenting with shortness of breath and new cough thought to have COPD exacerbation and found to be positive for COVID-19. PMH significant for hypertension, GERD, asthma, T2DM, OSA, possibly COPD  * Chronic obstructive pulmonary disease with acute exacerbation (HCC) Symptoms likely secondary to COPD exacerbation in the setting of COVID-19 infection. Still wheezy on exam with normal WOB. -O2 sats goals for COPD patients 88-92% -Redose a.m. prednisone orally 40 mg for 5 more days -Rx DuoNeb nebulizer every 4 hours as needed for 3 days -Discussed importance of smoking cessation - azithromycin 500 mg for 3 days -Outpatient PFTs -Could benefit from a daily controller -Vitals per floor -Continuous pulse ox -Ambulatory pulse ox   COVID-19 Due to oxygen requirement,can consider remdesivir vs paxlovid. Although O2 sat remained 86-92 on RA this morning which could be his normal if he does have COPD. He remains afebrile and is well appearing.  CBC still pending.  -shared decision making with pt in regards to treatment options if desired.  -airborne and contact precautions   Tobacco use Patient is interested in smoking cessation. -TOC consult -Nicotine patch 14 mg daily  Type 2 diabetes mellitus ([place]) A1c per chart review on 10/23/2021 at 7.2.  Creatinine stable. -Continue metformin 1000 mg daily -CBGs with meals and at night -Consider addition of sliding scale    FEN/GI: Regular diet PPx: Lovenox Dispo: Home pending continued medical management  Subjective:  Patient states he is feeling well this morning, denies any  shortness of breath or difficulty breathing at any time.  He would like to go home.  Objective: Temp:  [96 F (35.6 C)-98.4 F (36.9 C)] 98 F (36.7 C) (03/03 0616) Pulse Rate:  [44-109] 99 (03/03 0616) Resp:  [17-26] 18 (03/03 0616) BP: (104-179)/(71-138) 156/97 (03/03 0616) SpO2:  [90 %-99 %] 95 % (03/03 0616) Weight:  [139.3 kg-140.6 kg] 139.3 kg (03/02 1813) Physical Exam: General: 62 year old male, sitting up in bed, pleasant to speak with, NAD Cardiovascular: RRR Respiratory: Rhonchorous breath sounds and expiratory wheezes throughout, normal WOB, speaking in complete sentences  Laboratory: Most recent CBC Lab Results  Component Value Date   WBC 10.7 (H) 05/03/2022   HGB 15.3 05/18/2022   HCT 45.0 05/18/2022   MCV 94.4 05/03/2022   PLT 275 05/03/2022   Most recent BMP    Latest Ref Rng & Units 05/18/2022   11:54 AM  BMP  Glucose 70 - 99 mg/dL 127   BUN 8 - 23 mg/dL 8   Creatinine 0.61 - 1.24 mg/dL 1.00   Sodium 135 - 145 mmol/L 138   Potassium 3.5 - 5.1 mmol/L 3.8   Chloride 98 - 111 mmol/L 96      Levi Gilding, DO 05/19/2022, 7:22 AM  PGY-2, [place] Intern pager: [phone], text pages welcome Secure chat group [place]

## 2022-05-19 NOTE — Discharge Summary (Addendum)
[facility] Discharge Summary  Patient name: Levi Blankenship. [place] record number: [id number] Date of birth: 1960-05-08 Age: 62 y.o. Gender: male Date of Admission: 05/18/2022  Date of Discharge: 05/19/2022 Admitting Physician: Salvadore Oxford, MD  Primary Care Provider: Venida Jarvis, MD Consultants: None  Indication for Hospitalization: Dyspnea  Brief Hospital Course:  Levi Blankenship is a 62 y.o. male who presented with shortness of breath and new cough thought to have COPD exacerbation and found to be positive for COVID-19. PMH significant for hypertension, GERD, asthma, T2DM, OSA and newly diagnosed COPD.  Dyspnea secondary to COPD exacerbation In the ED, vitals were notable for tachycardia, hypertension, tachypnea, oxygen saturation 80% on room air which improved with 2 L [place]. Chest x-ray showing no active disease.  CBC with mild leukocytosis. CTA was negative for acute PE.  Patient received DuoNebs x 2 and 1 dose of IV Solu-Medrol. He was admitted to the floor on 2 L nasal cannula with oxygen saturations in the high 80s/low 90s.  Treated with DuoNebs prn, prednisone, azithromycin 500 mg for 3-day course. Was weaned to 1L O2. This is a new diagnosis and patient will need PFTs outpatient to confirm. Also noted to have echo that demonstrated EF 40-45% with global hypokinesis and mild calcification and mild thickening of aortic valve. Patient discharged on 1 more dose of azithromycin and 8 more days of decadron. Upon assessment, patient did not qualify for oxygen supplementation since he did well on room air. Patient was breathing comfortably and remained stable well prior to discharge, instructed to have close PCP follow up which was confirmed prior to discharge.    COVID-19 infection Patient was afebrile and well-appearing during admission.  Unsure if low SpO2 was due to COVID-19 versus COPD or combination of both. Started on paxlovid 5 day course and  continued on discharge.   All other issues chronic and stable.    Issues for follow up Patient started on paxlovid, statin held. Plan to resume this at follow up after paxlovid course completed.  Felodipine dose decreased while on paxlovid, please adjust at follow up as appropriate.  Monitor CBGs, adjust regimen as appropriate.  Please place cardiology referral at follow up. Will need PFTs outpatient. Patient would benefit from sleep study.   Discharge Diagnoses/Problem List:  * Chronic obstructive pulmonary disease with acute exacerbation ([place]) COVID-19 Tobacco use Type 2 diabetes mellitus ([place])  Disposition: home  Discharge Condition: medically stable  Discharge Exam:  General: Patient sitting upright on the side of the bed, speaking in complete sentences and in no acute distress. CV: RRR, no murmurs or gallops auscultated Resp: coarse breath sounds noted diffusely, no focal findings noted, breathing comfortably on 1L O2 without signs of respiratory distress  Significant Procedures: none  Significant Labs and Imaging:  Recent Labs  Lab 05/18/22 1154 05/19/22 0720  WBC  --  13.3*  HGB 15.3 14.6  HCT 45.0 46.3  PLT  --  309   Recent Labs  Lab 05/18/22 1154 05/19/22 0720  NA 138 140  K 3.8 3.9  CL 96* 99  CO2  --  29  GLUCOSE 127* 118*  BUN 8 8  CREATININE 1.00 1.00  CALCIUM  --  9.1     Results/Tests Pending at Time of Discharge: none  Discharge Medications:  Allergies as of 05/19/2022   No Known Allergies      Medication List     STOP taking these medications    atorvastatin 40  MG tablet Commonly known as: LIPITOR       TAKE these medications    albuterol 108 (90 Base) MCG/ACT inhaler Commonly known as: VENTOLIN HFA Inhale 2 puffs into the lungs every 6 (six) hours as needed for wheezing or shortness of breath.   aspirin EC 81 MG tablet Take 81 mg by mouth daily.   azithromycin 500 MG tablet Commonly known as: ZITHROMAX Take 1 tablet  (500 mg total) by mouth daily for 1 day. Start taking on: May 20, 2022   Blood Glucose Monitoring Suppl Devi 1 each by Does not apply route in the morning, at noon, and at bedtime. May substitute to any manufacturer covered by patient's insurance.   BLOOD GLUCOSE TEST STRIPS Strp 1 each by In Vitro route in the morning, at noon, and at bedtime. May substitute to any manufacturer covered by patient's insurance.   cetirizine 10 MG tablet Commonly known as: ZYRTEC Take 10 mg by mouth as needed for allergies.   dexamethasone 6 MG tablet Commonly known as: DECADRON Take 1 tablet (6 mg total) by mouth daily for 8 days. Start taking on: May 20, 2022   diphenhydrAMINE 25 MG tablet Commonly known as: BENADRYL Take 25 mg by mouth every 6 (six) hours as needed for allergies.   felodipine 5 MG 24 hr tablet Commonly known as: PLENDIL Take 1 tablet (5 mg total) by mouth daily. Start taking on: May 20, 2022 What changed:  medication strength how much to take   ipratropium-albuterol 0.5-2.5 (3) MG/3ML Soln Commonly known as: DUONEB Take 3 mLs by nebulization every 4 (four) hours as needed.   Lancet Device Misc 1 each by Does not apply route in the morning, at noon, and at bedtime. May substitute to any manufacturer covered by patient's insurance.   Lancets Misc. Misc 1 each by Does not apply route in the morning, at noon, and at bedtime. May substitute to any manufacturer covered by patient's insurance.   lisinopril 10 MG tablet Commonly known as: ZESTRIL Take 1 tablet (10 mg total) by mouth daily. Start taking on: May 20, 2022 What changed:  medication strength how much to take   metFORMIN 500 MG 24 hr tablet Commonly known as: GLUCOPHAGE-XR Take 1,000 mg by mouth daily with breakfast.   nirmatrelvir/ritonavir 20 x 150 MG & 10 x '100MG'$  Tabs Commonly known as: PAXLOVID Take 3 tablets by mouth 2 (two) times daily for 4 days. Patient GFR is >60. Take nirmatrelvir (150 mg) two  tablets twice daily for 5 days and ritonavir (100 mg) one tablet twice daily for 5 days.   omeprazole 40 MG capsule Commonly known as: PRILOSEC Take 40 mg by mouth in the morning and at bedtime.   umeclidinium-vilanterol 62.5-25 MCG/ACT Aepb Commonly known as: ANORO ELLIPTA Inhale 1 puff into the lungs daily. Start taking on: May 20, 2022               Durable Medical Equipment  (From admission, onward)           Start     Ordered   05/19/22 1042  For home use only DME oxygen  Once       Question Answer Comment  Length of Need 6 Months   Mode or (Route) Nasal cannula   Liters per Minute 1   Oxygen delivery system Gas      05/19/22 1041            Discharge Instructions: Please refer to Patient Instructions section  of EMR for full details.  Patient was counseled important signs and symptoms that should prompt return to medical care, changes in medications, dietary instructions, activity restrictions, and follow up appointments.   Follow-Up Appointments:  Follow-up Information     Burgert, Heide Spark, MD. Schedule an appointment as soon as possible for a visit.   Specialty: Family Medicine Why: Please make an appointment on 3/4 or 3/5 at your earliest convenience for a hospital follow up. Contact information: [place] [address] [place], [place], DO 05/19/2022, 2:14 PM PGY-3, [place]

## 2022-05-19 NOTE — Progress Notes (Signed)
SATURATION QUALIFICATIONS: (This note is used to comply with regulatory documentation for home oxygen)  Patient Saturations on Room Air at Rest = 92%  Patient Saturations on Room Air while Ambulating = 90%  Patient Saturations on 1 Liters of oxygen while Ambulating = 94%  Please briefly explain why patient needs home oxygen:  Pt. Becomes easily SOB with ambulation.  Anastasio Auerbach, RN

## 2022-05-19 NOTE — Discharge Instructions (Addendum)
You were hospitalized at [facility] due to shortness of breath.  We expect this is from COPD and COVID which improved after monitoring and medications.  We are so glad you are feeling better.  Be sure to follow-up with your regularly scheduled appointments.Please also be sure to follow-up with your PCP at your earliest convenience. Please stop taking your atorvastatin until you finish the paxlovid. We have decreased your felodipine to 5 mg daily, please take this until you see your doctor. Please check your glucose levels before meals and at bedtime. If they are consistently below 70 or consistently above 180s then please inform your PCP. Thank you for allowing [place] to be a part of your medical care.  Take care, [facility] family medicine team

## 2022-05-19 NOTE — Hospital Course (Addendum)
Levi Blankenship is a 62 y.o. male who presented with shortness of breath and new cough thought to have COPD exacerbation and found to be positive for COVID-19. PMH significant for hypertension, GERD, asthma, T2DM, OSA, possibly COPD  COPD exacerbation In the ED, vitals were notable for tachycardia, hypertension, tachypnea, oxygen saturation 80% on room air which improved with 2 L [place]. Chest x-ray showing no active disease.  CBC with mild leukocytosis. CTA was negative for acute PE.  Patient received DuoNebs x 2 and 1 dose of IV Solu-Medrol.  He was admitted to the floor on 2 L nasal cannula with oxygen saturations in the high 80s/low 90s.  Treated with DuoNebs prn, prednisone, azithromycin 500 mg for 3-day course.  Was weaned to room air***.  This is a new diagnosis and patient will need PFTs outpatient to confirm. Echo***  COVID-19 infection Patient was afebrile and well-appearing during admission.  Unsure if low SpO2 was due to COVID-19 versus COPD or combination of both. ***    Issues for follow up Patient started on paxlovid, statin held. Plan to resume this at follow up after paxlovid course completed.  Felodipine dose decreased while on paxlovid, please adjust at follow up as appropriate.  Monitor CBGs, adjust regimen as appropriate.  Please place cardiology referral at follow up. Will need PFTs outpatient. Patient would benefit from sleep study.

## 2022-05-19 NOTE — Progress Notes (Signed)
DISCHARGE NOTE HOME Iaan L. Fletes to be discharged Home per MD order. Discussed prescriptions and follow up appointments with the patient. Prescriptions given to patient including Paxlovid package; medication list explained in detail. Patient verbalized understanding. Pt. States he is familiar with taking his own blood sugar.  Skin clean, dry and intact without evidence of skin break down, no evidence of skin tears noted. IV catheter discontinued intact. Site without signs and symptoms of complications. Dressing and pressure applied. Pt denies pain at the site currently. No complaints noted.  Patient free of lines, drains, and wounds.   An After Visit Summary (AVS) was printed and given to the patient. Patient escorted via wheelchair, and discharged home via private auto.  Anastasio Auerbach, RN

## 2022-05-19 NOTE — Progress Notes (Signed)
  Echocardiogram 2D Echocardiogram has been performed.  Levi Blankenship 05/19/2022, 11:47 AM

## 2022-05-19 NOTE — Assessment & Plan Note (Addendum)
Due to oxygen requirement,can consider remdesivir vs paxlovid. Although O2 sat remained 86-92 on RA this morning which could be his normal if he does have COPD. He remains afebrile and is well appearing.  CBC still pending.  -shared decision making with pt in regards to treatment options if desired.  -airborne and contact precautions

## 2022-05-19 NOTE — TOC Transition Note (Signed)
Transition of Care [facility]) - CM/SW Discharge Note   Patient Details  Name: Levi Blankenship MRN: [id number] Date of Birth: [birth year]  Transition [facility]) CM/SW Contact:  Zenon Mayo, RN Phone Number: 05/19/2022, 2:55 PM   Clinical Narrative:    Patient is for dc today, he has transportation home, he did a ambulatory oxygen saturation and did not qualify for home oxygen, sats are above 88%.  MD aware.         Patient Goals and CMS Choice      Discharge Placement                         Discharge Plan and Services Additional resources added to the After Visit Summary for                                       Social Determinants of Health (SDOH) Interventions SDOH Screenings   Food Insecurity: No Food Insecurity (05/18/2022)  Housing: Low Risk  (05/18/2022)  Transportation Needs: No Transportation Needs (05/18/2022)  Utilities: Not At Risk (05/18/2022)  Tobacco Use: High Risk (05/18/2022)     Readmission Risk Interventions     No data to display

## 2022-05-19 NOTE — Plan of Care (Signed)
  Problem: Education: Goal: Ability to describe self-care measures that may prevent or decrease complications (Diabetes Survival Skills Education) will improve Outcome: Progressing Goal: Individualized Educational Video(s) Outcome: Progressing   Problem: Coping: Goal: Ability to adjust to condition or change in health will improve Outcome: Progressing   Problem: Fluid Volume: Goal: Ability to maintain a balanced intake and output will improve Outcome: Progressing   Problem: Health Behavior/Discharge Planning: Goal: Ability to identify and utilize available resources and services will improve Outcome: Progressing Goal: Ability to manage health-related needs will improve Outcome: Progressing   Problem: Metabolic: Goal: Ability to maintain appropriate glucose levels will improve Outcome: Progressing   Problem: Nutritional: Goal: Maintenance of adequate nutrition will improve Outcome: Progressing Goal: Progress toward achieving an optimal weight will improve Outcome: Progressing   Problem: Skin Integrity: Goal: Risk for impaired skin integrity will decrease Outcome: Progressing   Problem: Tissue Perfusion: Goal: Adequacy of tissue perfusion will improve Outcome: Progressing   Problem: Education: Goal: Knowledge of disease or condition will improve Outcome: Progressing Goal: Knowledge of the prescribed therapeutic regimen will improve Outcome: Progressing Goal: Individualized Educational Video(s) Outcome: Progressing   Problem: Activity: Goal: Ability to tolerate increased activity will improve Outcome: Progressing Goal: Will verbalize the importance of balancing activity with adequate rest periods Outcome: Progressing   Problem: Respiratory: Goal: Ability to maintain a clear airway will improve Outcome: Progressing Goal: Levels of oxygenation will improve Outcome: Progressing Goal: Ability to maintain adequate ventilation will improve Outcome: Progressing    Problem: Education: Goal: Knowledge of General Education information will improve Description: Including pain rating scale, medication(s)/side effects and non-pharmacologic comfort measures Outcome: Progressing   Problem: Health Behavior/Discharge Planning: Goal: Ability to manage health-related needs will improve Outcome: Progressing   Problem: Clinical Measurements: Goal: Ability to maintain clinical measurements within normal limits will improve Outcome: Progressing Goal: Will remain free from infection Outcome: Progressing Goal: Diagnostic test results will improve Outcome: Progressing Goal: Respiratory complications will improve Outcome: Progressing Goal: Cardiovascular complication will be avoided Outcome: Progressing   Problem: Activity: Goal: Risk for activity intolerance will decrease Outcome: Progressing   Problem: Nutrition: Goal: Adequate nutrition will be maintained Outcome: Progressing   Problem: Coping: Goal: Level of anxiety will decrease Outcome: Progressing   Problem: Elimination: Goal: Will not experience complications related to bowel motility Outcome: Progressing Goal: Will not experience complications related to urinary retention Outcome: Progressing   Problem: Pain Managment: Goal: General experience of comfort will improve Outcome: Progressing   Problem: Safety: Goal: Ability to remain free from injury will improve Outcome: Progressing   Problem: Skin Integrity: Goal: Risk for impaired skin integrity will decrease Outcome: Progressing

## 2022-05-20 ENCOUNTER — Telehealth: Payer: Self-pay | Admitting: Family Medicine

## 2022-05-20 ENCOUNTER — Telehealth: Payer: Self-pay

## 2022-05-20 LAB — HEMOGLOBIN A1C
Hgb A1c MFr Bld: 7 % — ABNORMAL HIGH (ref 4.8–5.6)
Mean Plasma Glucose: 154 mg/dL

## 2022-05-20 NOTE — Telephone Encounter (Signed)
A1C discussed with him. He was evaluated by his PCP today and his A1C had always been around 7 which is his goal. No meds adjustment needed. He was appreciative of the call.

## 2022-05-20 NOTE — Telephone Encounter (Signed)
Patients wife calls nurse line in regards to two medications sent in at discharge.   She reports Paxlovid and Anoro Ellipta have unaffordable copays.   Advised wife he is not a patient of [facility], however I will message back to ordering provider on inpatient for advisement.

## 2022-05-23 ENCOUNTER — Encounter: Payer: Self-pay | Admitting: Internal Medicine

## 2022-06-13 ENCOUNTER — Other Ambulatory Visit: Payer: Self-pay | Admitting: Family Medicine

## 2022-06-15 ENCOUNTER — Other Ambulatory Visit: Payer: Self-pay | Admitting: Family Medicine

## 2022-06-24 ENCOUNTER — Other Ambulatory Visit: Payer: Self-pay | Admitting: Family Medicine

## 2022-06-24 ENCOUNTER — Ambulatory Visit (AMBULATORY_SURGERY_CENTER): Payer: BLUE CROSS/BLUE SHIELD

## 2022-06-24 VITALS — Ht 71.0 in | Wt 310.0 lb

## 2022-06-24 DIAGNOSIS — Z800 Family history of malignant neoplasm of digestive organs: Secondary | ICD-10-CM

## 2022-06-24 DIAGNOSIS — Z86010 Personal history of colonic polyps: Secondary | ICD-10-CM

## 2022-06-24 MED ORDER — NA SULFATE-K SULFATE-MG SULF 17.5-3.13-1.6 GM/177ML PO SOLN: 1.0000 | Freq: Once | ORAL | 0 refills | Status: AC

## 2022-06-24 NOTE — Progress Notes (Signed)

## 2022-07-12 ENCOUNTER — Encounter (HOSPITAL_COMMUNITY): Payer: Self-pay

## 2022-07-12 ENCOUNTER — Ambulatory Visit (HOSPITAL_COMMUNITY)
Admission: EM | Admit: 2022-07-12 | Discharge: 2022-07-12 | Disposition: A | Discharge status: Home / Self Care | Payer: BLUE CROSS/BLUE SHIELD | Attending: Internal Medicine | Admitting: Internal Medicine

## 2022-07-12 ENCOUNTER — Ambulatory Visit (INDEPENDENT_AMBULATORY_CARE_PROVIDER_SITE_OTHER): Payer: BLUE CROSS/BLUE SHIELD

## 2022-07-12 DIAGNOSIS — J440 Chronic obstructive pulmonary disease with acute lower respiratory infection: Secondary | ICD-10-CM

## 2022-07-12 DIAGNOSIS — S46911A Strain of unspecified muscle, fascia and tendon at shoulder and upper arm level, right arm, initial encounter: Secondary | ICD-10-CM

## 2022-07-12 DIAGNOSIS — J209 Acute bronchitis, unspecified: Secondary | ICD-10-CM

## 2022-07-12 DIAGNOSIS — J441 Chronic obstructive pulmonary disease with (acute) exacerbation: Secondary | ICD-10-CM

## 2022-07-12 MED ORDER — IPRATROPIUM-ALBUTEROL 0.5-2.5 (3) MG/3ML IN SOLN
RESPIRATORY_TRACT | Status: AC
  Filled 2022-07-12: qty 3

## 2022-07-12 MED ORDER — DEXAMETHASONE SODIUM PHOSPHATE 10 MG/ML IJ SOLN
INTRAMUSCULAR | Status: AC
  Filled 2022-07-12: qty 1

## 2022-07-12 MED ORDER — IPRATROPIUM-ALBUTEROL 0.5-2.5 (3) MG/3ML IN SOLN
3.0000 mL | Freq: Once | RESPIRATORY_TRACT | Status: AC
  Administered 2022-07-12: 3 mL via RESPIRATORY_TRACT

## 2022-07-12 MED ORDER — METHOCARBAMOL 500 MG PO TABS: 500.0000 mg | ORAL_TABLET | Freq: Two times a day (BID) | ORAL | 0 refills | Status: DC

## 2022-07-12 MED ORDER — DEXAMETHASONE SODIUM PHOSPHATE 10 MG/ML IJ SOLN
10.0000 mg | Freq: Once | INTRAMUSCULAR | Status: AC
  Administered 2022-07-12: 10 mg via INTRAMUSCULAR
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
## 2022-07-12 NOTE — Discharge Instructions (Addendum)
Your pain is likely due to a muscle strain which will improve on its own with time. Your x-ray of your shoulder also shows arthritis which is due to chronic inflammation.   - The steroid shot I gave you in the clinic will help with pain and inflammation to your shoulder over the next 2-3 days as well as wheezing.  - Continue using albuterol breathing treatments at home as needed for cough, shortness of breath, and wheezing.  - Tylenol every 6 hours as needed for pain. - You may also take the prescribed muscle relaxer as directed as needed for muscle aches/spasm.  Do not take this medication and drive or drink alcohol as it can make you sleepy.  Mainly use this medicine at nighttime as needed. - Apply heat 20 minutes on then 20 minutes off and perform gentle range of motion exercises to the area of greatest pain to prevent muscle stiffness and provide further pain relief.   Red flag symptoms to watch out for are numbness/tingling to the legs, weakness, loss of bowel/bladder control, and/or worsening pain that does not respond well to medicines. Follow-up with your primary care provider or return to urgent care if your symptoms do not improve in the next 3 to 4 days with medications and interventions recommended today. If your symptoms are severe (red flag), please go to the emergency room.  I hope you feel better!

## 2022-07-16 ENCOUNTER — Institutional Professional Consult (permissible substitution): Payer: BLUE CROSS/BLUE SHIELD | Admitting: Pulmonary Disease

## 2022-07-17 ENCOUNTER — Encounter: Payer: Self-pay | Admitting: Internal Medicine

## 2022-07-17 ENCOUNTER — Ambulatory Visit (AMBULATORY_SURGERY_CENTER): Payer: Self-pay | Admitting: Internal Medicine

## 2022-07-17 VITALS — BP 169/115 | HR 87 | Temp 93.9°F | Resp 14 | Ht 71.0 in | Wt 310.0 lb

## 2022-07-17 DIAGNOSIS — Z86010 Personal history of colonic polyps: Secondary | ICD-10-CM

## 2022-07-17 DIAGNOSIS — Z800 Family history of malignant neoplasm of digestive organs: Secondary | ICD-10-CM

## 2022-07-17 MED ORDER — SODIUM CHLORIDE 0.9 % IV SOLN: 500.0000 mL | Freq: Once | INTRAVENOUS | Status: DC

## 2022-07-17 NOTE — Patient Instructions (Signed)
DISCHARGE INSTRUCTIONS GIVEN. HANDOUTS ON POLYPS AND DIVERTICULOSIS. RESUME PREVIOUS MEDICATIONS. OFFICE WILL CALL TO SCHEDULE REPEAT PROCEDURE AT HOSPITAL. YOU HAD AN ENDOSCOPIC PROCEDURE TODAY AT THE [facility]:   Refer to the procedure report that was given to you for any specific questions about what was found during the examination.  If the procedure report does not answer your questions, please call your gastroenterologist to clarify.  If you requested that your care partner not be given the details of your procedure findings, then the procedure report has been included in a sealed envelope for you to review at your convenience later.  YOU SHOULD EXPECT: Some feelings of bloating in the abdomen. Passage of more gas than usual.  Walking can help get rid of the air that was put into your GI tract during the procedure and reduce the bloating. If you had a lower endoscopy (such as a colonoscopy or flexible sigmoidoscopy) you may notice spotting of blood in your stool or on the toilet paper. If you underwent a bowel prep for your procedure, you may not have a normal bowel movement for a few days.  Please Note:  You might notice some irritation and congestion in your nose or some drainage.  This is from the oxygen used during your procedure.  There is no need for concern and it should clear up in a day or so.  SYMPTOMS TO REPORT IMMEDIATELY:  Following lower endoscopy (colonoscopy or flexible sigmoidoscopy):  Excessive amounts of blood in the stool  Significant tenderness or worsening of abdominal pains  Swelling of the abdomen that is new, acute  Fever of 100F or higher   For urgent or emergent issues, a gastroenterologist can be reached at any hour by calling [phone]. Do not use MyChart messaging for urgent concerns.    DIET:  We do recommend a small meal at first, but then you may proceed to your regular diet.  Drink plenty of fluids but you should avoid alcoholic  beverages for 24 hours.  ACTIVITY:  You should plan to take it easy for the rest of today and you should NOT DRIVE or use heavy machinery until tomorrow (because of the sedation medicines used during the test).    FOLLOW UP: Our staff will call the number listed on your records the next business day following your procedure.  We will call around 7:15- 8:00 am to check on you and address any questions or concerns that you may have regarding the information given to you following your procedure. If we do not reach you, we will leave a message.     If any biopsies were taken you will be contacted by phone or by letter within the next 1-3 weeks.  Please call us at [phone] if you have not heard about the biopsies in 3 weeks.    SIGNATURES/CONFIDENTIALITY: You and/or your care partner have signed paperwork which will be entered into your electronic medical record.  These signatures attest to the fact that that the information above on your After Visit Summary has been reviewed and is understood.  Full responsibility of the confidentiality of this discharge information lies with you and/or your care-partner.

## 2022-07-17 NOTE — Progress Notes (Signed)
Pt's states no medical or surgical changes since previsit or office visit.  CRNA made aware of high BP.

## 2022-07-17 NOTE — Progress Notes (Signed)
GASTROENTEROLOGY PROCEDURE H&P NOTE   Primary Care Physician: Abbott Pao, MD    Reason for Procedure:   Hx of multiple adenomatous polyps  Plan:    colonoscopy  Patient is appropriate for endoscopic procedure(s) in the ambulatory ([facility]) setting.  The nature of the procedure, as well as the risks, benefits, and alternatives were carefully and thoroughly reviewed with the patient. Ample time for discussion and questions allowed. The patient understood, was satisfied, and agreed to proceed.     HPI: Levi Blankenship is a 62 y.o. male who presents for colonoscopy.  Medical history as below.  Tolerated the prep.  No recent chest pain or shortness of breath.  No abdominal pain today.  Past Medical History:  Diagnosis Date   Blood in stool    OVER A MONTH AGO PER PT WIFE ON 11-27-2020   Claustrophobia 11/27/2020   COPD (chronic obstructive pulmonary disease) (HCC)    COVID    SPRING 2022 COUGH CHILLS SOB X 4 DAYS ALL SYMPTOMS REOLVED   Dyspnea    OCC DUE TO ACID REFLUX   GERD (gastroesophageal reflux disease)    Hypertension    MILD ANXIETY 11/27/2020   NO MEDS TAKEN   MILD DEPRESSION 11/27/2020   NO MEDS TAKEN   Mild intermittent asthma without complication 02/17/2019   New onset type 2 diabetes mellitus (HCC) 12/01/2018   Prolapsed hemorrhoids 11/27/2020   Rotator cuff tear, left    DONE SEVERAL MNTHS AGO, PER PT WIFE ON 11-27-2020 HAS PAIN WITH CAN MOVE ARM OK   Sleep apnea    not currently using c-pap   Urinary frequency 11/27/2020   Wears partial dentures 11/27/2020   UPPER AND LOWER    Past Surgical History:  Procedure Laterality Date   COLONSCOPY  10/10/2020   LABAUER GI WITH PRECANCEROUS AREAS REMOVED   EVALUATION UNDER ANESTHESIA WITH HEMORRHOIDECTOMY N/A 11/30/2020   Procedure: EXCISION OF PROLAPSING ANAL MASS, HEMORRHOIDECTOMY, HEMORRHOIDOPEXY, ANORECTAL EXAMINATION UNDER ANESTHESIA;  Surgeon: Karie Soda, MD;  Location: [facility];   Service: General;  Laterality: N/A;  GEN & LOCAL   TONSILLECTOMY     MORE THAN 20 YRS AGO, ADDENOIDS REMOVED ALSO   tumor neck     benign and removed 15 YRS AGO    Prior to Admission medications   Medication Sig Start Date End Date Taking? Authorizing Provider  albuterol (VENTOLIN HFA) 108 (90 Base) MCG/ACT inhaler Inhale 2 puffs into the lungs every 6 (six) hours as needed for wheezing or shortness of breath. 05/03/22  Yes Lonell Grandchild, MD  aspirin EC 81 MG tablet Take 81 mg by mouth daily.   Yes [provider]  budesonide-formoterol (SYMBICORT) 80-4.5 MCG/ACT inhaler Inhale 2 puffs into the lungs daily. 05/23/22 05/23/23 Yes [provider]  cetirizine (ZYRTEC) 10 MG tablet Take 10 mg by mouth as needed for allergies.   Yes [provider]  diphenhydrAMINE (BENADRYL) 25 MG tablet Take 25 mg by mouth every 6 (six) hours as needed for allergies.   Yes [provider]  felodipine (PLENDIL) 5 MG 24 hr tablet Take 1 tablet (5 mg total) by mouth daily. 05/20/22  Yes Ganta, Anupa, DO  gabapentin (NEURONTIN) 100 MG capsule Take 200 mg by mouth 3 (three) times daily.   Yes [provider]  ipratropium-albuterol (DUONEB) 0.5-2.5 (3) MG/3ML SOLN Take 3 mLs by nebulization every 4 (four) hours as needed. 05/19/22  Yes Ganta, Anupa, DO  levocetirizine (XYZAL) 5 MG tablet TAKE  1 TABLET BY MOUTH ONCE DAILY IN THE EVENING. PT NEEDS OV FOR ANY ADDITIONAL REFILLS. 08/24/19  Yes [provider]  lisinopril (ZESTRIL) 10 MG tablet Take 1 tablet (10 mg total) by mouth daily. 05/20/22  Yes Ganta, Anupa, DO  metFORMIN (GLUCOPHAGE-XR) 500 MG 24 hr tablet Take 1,000 mg by mouth daily with breakfast. 11/24/19  Yes [provider]  omeprazole (PRILOSEC) 40 MG capsule Take 40 mg by mouth in the morning and at bedtime. 01/13/18  Yes [provider]  umeclidinium-vilanterol (ANORO ELLIPTA) 62.5-25 MCG/ACT AEPB Inhale 1 puff into the lungs daily. 05/20/22  Yes  Ganta, Anupa, DO  Accu-Chek Softclix Lancets lancets SMARTSIG:Topical 05/19/22   [provider]  Blood Glucose Monitoring Suppl DEVI 1 each by Does not apply route in the morning, at noon, and at bedtime. May substitute to any manufacturer covered by patient's insurance. 05/19/22   Ganta, Anupa, DO  methocarbamol (ROBAXIN) 500 MG tablet Take 1 tablet (500 mg total) by mouth 2 (two) times daily. 07/12/22   Carlisle Beers, FNP    Current Outpatient Medications  Medication Sig Dispense Refill   albuterol (VENTOLIN HFA) 108 (90 Base) MCG/ACT inhaler Inhale 2 puffs into the lungs every 6 (six) hours as needed for wheezing or shortness of breath. 18 g 11   aspirin EC 81 MG tablet Take 81 mg by mouth daily.     budesonide-formoterol (SYMBICORT) 80-4.5 MCG/ACT inhaler Inhale 2 puffs into the lungs daily.     cetirizine (ZYRTEC) 10 MG tablet Take 10 mg by mouth as needed for allergies.     diphenhydrAMINE (BENADRYL) 25 MG tablet Take 25 mg by mouth every 6 (six) hours as needed for allergies.     felodipine (PLENDIL) 5 MG 24 hr tablet Take 1 tablet (5 mg total) by mouth daily. 30 tablet 0   gabapentin (NEURONTIN) 100 MG capsule Take 200 mg by mouth 3 (three) times daily.     ipratropium-albuterol (DUONEB) 0.5-2.5 (3) MG/3ML SOLN Take 3 mLs by nebulization every 4 (four) hours as needed. 360 mL 1   levocetirizine (XYZAL) 5 MG tablet TAKE 1 TABLET BY MOUTH ONCE DAILY IN THE EVENING. PT NEEDS OV FOR ANY ADDITIONAL REFILLS.     lisinopril (ZESTRIL) 10 MG tablet Take 1 tablet (10 mg total) by mouth daily. 30 tablet 0   metFORMIN (GLUCOPHAGE-XR) 500 MG 24 hr tablet Take 1,000 mg by mouth daily with breakfast.     omeprazole (PRILOSEC) 40 MG capsule Take 40 mg by mouth in the morning and at bedtime.     umeclidinium-vilanterol (ANORO ELLIPTA) 62.5-25 MCG/ACT AEPB Inhale 1 puff into the lungs daily. 30 each 1   Accu-Chek Softclix Lancets lancets SMARTSIG:Topical     Blood Glucose Monitoring Suppl  DEVI 1 each by Does not apply route in the morning, at noon, and at bedtime. May substitute to any manufacturer covered by patient's insurance. 1 each 0   methocarbamol (ROBAXIN) 500 MG tablet Take 1 tablet (500 mg total) by mouth 2 (two) times daily. 20 tablet 0   Current Facility-Administered Medications  Medication Dose Route Frequency Provider Last Rate Last Admin   0.9 %  sodium chloride infusion  500 mL Intravenous Once Daleigh Pollinger, Carie Caddy, MD        Allergies as of 07/17/2022   (No Known Allergies)    Family History  Problem Relation Age of Onset   Diabetes Father    Heart disease Brother    Colon cancer Maternal Grandmother  dx. 60s   Esophageal cancer Maternal Grandfather 86   Prostate cancer Maternal Grandfather 86   Stomach cancer Neg Hx     Social History   Socioeconomic History   Marital status: Married    Spouse name: Not on file   Number of children: Not on file   Years of education: Not on file   Highest education level: Not on file  Occupational History   Not on file  Tobacco Use   Smoking status: Former    Years: 30    Types: Cigarettes    Quit date: 05/2022    Years since quitting: 0.1   Smokeless tobacco: Never   Tobacco comments:    SMOKES SOME DAYS SMALL FEW CIGARETTES PER PT  Vaping Use   Vaping Use: Never used  Substance and Sexual Activity   Alcohol use: Yes    Alcohol/week: 1.0 standard drink of alcohol    Types: 1 Standard drinks or equivalent per week   Drug use: Yes    Types: Marijuana    Comment: MARIJUANA LAST USED 11-26-2021 PER PT WIFE   Sexual activity: Yes  Other Topics Concern   Not on file  Social History Narrative   Not on file   Social Determinants of Health   Financial Resource Strain: Not on file  Food Insecurity: No Food Insecurity (05/18/2022)   Hunger Vital Sign    Worried About Running Out of Food in the Last Year: Never true    Ran Out of Food in the Last Year: Never true  Transportation Needs: No  Transportation Needs (05/18/2022)   PRAPARE - Administrator, Civil Service (Medical): No    Lack of Transportation (Non-Medical): No  Physical Activity: Not on file  Stress: Not on file  Social Connections: Not on file  Intimate Partner Violence: Not At Risk (05/18/2022)   Humiliation, Afraid, Rape, and Kick questionnaire    Fear of Current or Ex-Partner: No    Emotionally Abused: No    Physically Abused: No    Sexually Abused: No    Physical Exam: Vital signs in last 24 hours: @BP  (!) 184/134   Pulse 96   Temp (!) 93.9 F (34.4 C)   Resp 17   Ht 5\' 11"  (1.803 m)   Wt (!) 310 lb (140.6 kg)   SpO2 100%   BMI 43.24 kg/m  GEN: NAD EYE: Sclerae anicteric ENT: MMM CV: Non-tachycardic Pulm: CTA b/l GI: Soft, NT/ND NEURO:  Alert & Oriented x 3   Erick Blinks, MD [facility]  07/17/2022 8:09 AM

## 2022-07-17 NOTE — Progress Notes (Signed)
BP high despite 10mg  Labetolol given.  Pt not clean.  Procedure aborted.  NAD transferred to PACU

## 2022-07-17 NOTE — Op Note (Signed)
[facility] Patient Name: Levi Blankenship Procedure Date: 07/17/2022 7:57 AM MRN: [id number] Endoscopist: Beverley Fiedler , MD, [id number] Age: 62 Referring MD:  Date of Birth: 09-22-1960 Gender: Male Account #: [id number] Procedure:                Colonoscopy Indications:              High risk colon cancer surveillance: Personal                            history of multiple adenomas (greater than 10                            included those > 1 cm), Family history of colon                            cancer, Last colonoscopy: July 2022 Medicines:                Monitored Anesthesia Care Procedure:                Pre-Anesthesia Assessment:                           - Prior to the procedure, a History and Physical                            was performed, and patient medications and                            allergies were reviewed. The patient's tolerance of                            previous anesthesia was also reviewed. The risks                            and benefits of the procedure and the sedation                            options and risks were discussed with the patient.                            All questions were answered, and informed consent                            was obtained. Prior Anticoagulants: The patient has                            taken no anticoagulant or antiplatelet agents. ASA                            Grade Assessment: III - A patient with severe                            systemic disease. After reviewing the risks and  benefits, the patient was deemed in satisfactory                            condition to undergo the procedure.                           After obtaining informed consent, the colonoscope                            was passed under direct vision. Throughout the                            procedure, the patient's blood pressure, pulse, and                            oxygen saturations were monitored  continuously. The                            CF [id number] #[id number] was introduced through the anus                            with the intention of advancing to the cecum. The                            scope was advanced to the transverse colon before                            the procedure was aborted. Medications were given.                            The colonoscopy was performed without difficulty.                            The patient tolerated the procedure well. The                            quality of the bowel preparation was poor. The                            rectum was photographed. Scope In: 8:17:36 AM Scope Out: 8:20:00 AM Total Procedure Duration: 0 hours 2 minutes 24 seconds  Findings:                 The digital rectal exam was normal.                           Extensive amounts of semi-liquid semi-solid stool                            was found in the rectum, in the descending colon                            and in the transverse colon, precluding  visualization.                           Multiple large-mouthed and small-mouthed                            diverticula were found in the sigmoid colon,                            descending colon and transverse colon.                           A few sessile polyps were found in the colon. The                            polyps were small in size. Polypectomy not                            attempted as procedure was aborted with                            recommendation to reschedule.                           Retroflexion in the rectum was not performed due to                            aborting the procedure before completion. Complications:            No immediate complications. Estimated Blood Loss:     Estimated blood loss: none. Impression:               - Preparation of the colon was poor.                           - Stool in the rectum, in the descending colon and                             in the transverse colon.                           - Mild diverticulosis in the sigmoid colon, in the                            descending colon and in the transverse colon.                           - A few small polyps. Polypectomy not attempted                            today.                           - No specimens collected. Recommendation:           - Patient has a contact number available for  emergencies. The signs and symptoms of potential                            delayed complications were discussed with the                            patient. Return to normal activities tomorrow.                            Written discharge instructions were provided to the                            patient.                           - Resume previous diet.                           - Continue present medications.                           - Repeat colonoscopy at appointment to be scheduled                            because the bowel preparation was suboptimal. 2 day                            prep. Hospital setting.                           - Please contact your primary doctor to discuss                            blood pressure management given hypertension seen                            today. Beverley Fiedler, MD 07/17/2022 8:28:31 AM This report has been signed electronically.

## 2022-07-18 ENCOUNTER — Telehealth: Payer: Self-pay | Admitting: *Deleted

## 2022-07-18 ENCOUNTER — Other Ambulatory Visit: Payer: Self-pay | Admitting: Internal Medicine

## 2022-07-18 DIAGNOSIS — Z86010 Personal history of colonic polyps: Secondary | ICD-10-CM

## 2022-07-18 DIAGNOSIS — Z800 Family history of malignant neoplasm of digestive organs: Secondary | ICD-10-CM

## 2022-07-18 NOTE — Telephone Encounter (Signed)
See colonoscopy procedure note dated 07/17/22. Patient to be scheduled for repeat colonoscopy at the hospital with a two day prior due to recent suboptimal prep with suprep.  I have spoken to patient and scheduled him for repeat colonoscopy at [facility] on 10/01/22 at 730 am (6am arrival). He has also been scheduled for telephone previsit 09/10/22 at 8:00 am for new two day prep detailed instructions.  In addition, Dr Rhea Belton noted that patient should be in contact with PCP regarding recent blood pressure readings. Patient indicates that he contacted his PCP yesterday for this.

## 2022-07-18 NOTE — Telephone Encounter (Signed)
Patient's wife returned call, states patient is doing well, has no questions or concerns at this time.

## 2022-07-18 NOTE — Telephone Encounter (Signed)
Attempted to call patient for their post-procedure follow-up call. No answer. Left voicemail.   

## 2022-09-06 ENCOUNTER — Ambulatory Visit: Payer: BLUE CROSS/BLUE SHIELD | Admitting: Orthopaedic Surgery

## 2022-09-10 ENCOUNTER — Encounter: Payer: Self-pay | Admitting: Internal Medicine

## 2022-09-10 ENCOUNTER — Ambulatory Visit (AMBULATORY_SURGERY_CENTER): Payer: BLUE CROSS/BLUE SHIELD

## 2022-09-10 VITALS — Ht 71.0 in | Wt 310.0 lb

## 2022-09-10 DIAGNOSIS — Z86010 Personal history of colonic polyps: Secondary | ICD-10-CM

## 2022-09-10 MED ORDER — PEG 3350-KCL-NA BICARB-NACL 420 G PO SOLR
4000.0000 mL | Freq: Once | ORAL | 0 refills | Status: AC
Start: 2022-09-10 — End: 2022-09-10

## 2022-09-10 NOTE — Progress Notes (Signed)

## 2022-09-18 ENCOUNTER — Encounter (HOSPITAL_COMMUNITY): Payer: Self-pay | Admitting: Internal Medicine

## 2022-09-18 NOTE — Telephone Encounter (Signed)
Dr Rhea Belton would like patient to have a blood pressure check next week, prior to his scheduled colonoscopy procedure. I have spoken to patient who agrees to come 09/24/22 at 9 am for check.

## 2022-09-20 ENCOUNTER — Telehealth: Payer: Self-pay | Admitting: Internal Medicine

## 2022-09-20 NOTE — Telephone Encounter (Signed)
Inbound call from patients wife stating that she is wanting to know if we can give a referral to [facility] for patient to have procedure with them due to having procedure with [facility] it will cost 5,000. Requesting to speak with nurse. Please advise.

## 2022-09-23 NOTE — Telephone Encounter (Signed)
See note below and advise if you are willing to refer to another GI at [facility] or if her PCP needs to make the referral.

## 2022-09-24 NOTE — Telephone Encounter (Signed)
Ok to refer given patient request due to expense of hospital procedure JMP

## 2022-09-24 NOTE — Telephone Encounter (Signed)
Referral faxed to [facility] in [place] at [phone]. Address [address] [place] [address]. Wife knows office will contact them regarding appt.

## 2022-09-30 ENCOUNTER — Encounter (HOSPITAL_COMMUNITY): Payer: Self-pay | Admitting: Anesthesiology

## 2022-09-30 ENCOUNTER — Telehealth: Payer: Self-pay

## 2022-09-30 NOTE — Anesthesia Preprocedure Evaluation (Signed)
Anesthesia Evaluation    Airway        Dental  (+) Partial Upper, Partial Lower   Pulmonary asthma , sleep apnea , COPD, Patient abstained from smoking., former smoker          Cardiovascular hypertension, Pt. on medications      Neuro/Psych   Anxiety Depression    negative neurological ROS     GI/Hepatic Neg liver ROS,GERD  Medicated,,polyps   Endo/Other  diabetes, Type 2, Oral Hypoglycemic Agents    Renal/GU negative Renal ROS  negative genitourinary   Musculoskeletal  (+) Arthritis , Osteoarthritis,    Abdominal   Peds  Hematology negative hematology ROS (+)   Anesthesia Other Findings   Reproductive/Obstetrics                             Anesthesia Physical Anesthesia Plan  ASA: 2  Anesthesia Plan: MAC   Post-op Pain Management:    Induction: Intravenous  PONV Risk Score and Plan: 1 and Propofol infusion, Treatment may vary due to age or medical condition, Ondansetron and Dexamethasone  Airway Management Planned: Simple Face Mask and Nasal Cannula  Additional Equipment: None  Intra-op Plan:   Post-operative Plan:   Informed Consent:   Plan Discussed with:   Anesthesia Plan Comments:        Anesthesia Quick Evaluation

## 2022-09-30 NOTE — Telephone Encounter (Signed)
Pt scheduled for hosp case at [facility] tomorrow but pt was referred to [facility] 09/24/22. Spoke with pt and he will not be coming for the appt tomorrow. Appt cancelled and Dr. Terri Piedra aware.

## 2022-10-01 ENCOUNTER — Ambulatory Visit (HOSPITAL_COMMUNITY)
Admission: RE | Admit: 2022-10-01 | Payer: BLUE CROSS/BLUE SHIELD | Source: Home / Self Care | Admitting: Internal Medicine

## 2022-10-01 SURGERY — COLONOSCOPY WITH PROPOFOL
Anesthesia: Monitor Anesthesia Care

## 2022-11-06 ENCOUNTER — Encounter: Payer: BLUE CROSS/BLUE SHIELD | Admitting: *Deleted

## 2022-11-06 VITALS — BP 140/99 | HR 90 | Temp 97.4°F | Resp 20 | Ht 71.0 in | Wt 310.0 lb

## 2022-11-06 DIAGNOSIS — Z006 Encounter for examination for normal comparison and control in clinical research program: Secondary | ICD-10-CM

## 2022-11-06 NOTE — Research (Addendum)
AA HEART  Informed Consent   Subject Name: Levi Blankenship. [name]  Subject met inclusion and exclusion criteria.  The informed consent form, study requirements and expectations were reviewed with the subject and questions and concerns were addressed prior to the signing of the consent form.  The subject verbalized understanding of the trial requirements.  The subject agreed to participate in the AA HEART trial and signed the informed consent at 09:58 on 11-06-2022;P.  The informed consent was obtained prior to performance of any protocol-specific procedures for the subject.  A copy of the signed informed consent was given to the subject and a copy was placed in the subject's medical record.   Levi Blankenship    Are there any labs that are clinically significant?  Yes []  OR No[]   Please FORWARD back to me with any changes or follow up!    ACCESSION NO. [id number]                                             Page 1 of 1                                                        INVESTIGATOR: ([id number])                          PROTOCOL   [id number]                     Thomasene Ripple, M.D.                              INVESTIGATOR NO.: [id number]                     c/o [facility]                         SUBJECT NUMBER: [id number]                     [facility]                 SUBJECT INITIALS NOT COLLECTED:                     [address]                          VISIT: D0                     [place], [place] [address]                   SPONSOR REPORT TO:                 COLLECTION TIME:10:58 DATE:06-Nov-2022                     Cruzita Lederer                 DATE RECEIVED IN LABORATORY: 07-Nov-2022  c/o Sponsor [employer] access         DATE REPORTED BY LABORATORY: 07-Nov-2022                     [place]                          SEX: M  AGE: 15X                     [address]                   [place], [place] [phone]                                                                                         Ref. Ranges               Clinical    Comments                                                                          Significance                                                                            Yes*  No                    HEMATOLOGY&DIFFERENTIAL PANEL                      HGB            15.4         12.5-17.0 g/dL                      HCT            45           37-51 %                      RBC            4.8          4.0-5.8 x106/uL                      MCH            32           26-34 pg                      MCHC  34           31-38 g/dL                      RDW            13.3         12.0-15.0 %                      RBC Morph      No Review Required                        MCV            94           80-100 fL                      WBC            9.36         3.80-10.70 x103/uL                      Neutrophil     6.23         1.96-7.23 x103/uL                      Lymphocyte     2.39         0.80-3.00 x103/uL                      Monocytes      0.47         0.12-0.92 x103/uL                      Eosinophil     0.23         0.00-0.57 x103/uL                      Basophils      0.04         0.00-0.20 x103/uL                      Neutrophil     66.5         40.5-75.0 %                      Lymphocyte     25.6         15.4-48.5%                      Monocytes      5.0          2.6-10.1 %                      Eosinophil     2.5          0.0-6.8 %                      Basophils      0.4          0.0-2.0 %                      Platelets      272          130-394 x103/uL  RETICULOCYTES  Retic %        1.4          0.6-2.5 %                      Retic Abs      0.066        0.030-0.130 x106/uL   ANC                      ANC            6.23         1.96-7.23 x103/uL                    HBA1C                      Fast HbA1c     7.1     H    <6.5%     [id number] LPA COLLECTION  D/T                      Untimed D      06-Nov-2022                        Untimed T      10:58                            SM/PLASMA PROTEO & BMS D/T                      Untimed D      06-Nov-2022                        Untimed T      10:58                            SM/WB DNA COLLECTION D/T                      Untimed D      06-Nov-2022                        Untimed T      10:58                            SM/WB PAXGENE RNA COLLECT D/T                      Untimed D      06-Nov-2022                        Untimed T      10:58                            SUBJECT FASTED AT LEAST 9 HRS?                      Pt fast 9h     No         Participant ID: [id number]  LP(a) Result: < 7 nmol/L  Date Resulted: December 24, 2022  Date of Coaching: December 30,2024   [  x] (Please check once complete) Discussed Lp(a) result with participant:    What is Lipoprotein(a) Lp(a)?  What is a "normal" Lp(a) level, and when should I be concerned?  How is Lp(a) related to "bad cholesterol?"  Does a high Lp(a) level increase my risk of heart disease?  How are Lp(a) levels inherited?  Do Lp(a) levels vary by race or ethnicity?  Diagnosing High Lp(a)  What are the signs of high Lp(a)?  How to get an Lp(a) test?  What Lp(a) results are considered high? How do I get a diagnosis of elevated lipoprotein(a)?  How can I lower my Lp(a)?    During the return of results coaching session, all the topics listed above were reviewed with the participant as per the documents: Guidance for Clinician-Patient Lp(a) Risk Assessment Discussion African American Heart Study and Micro-Learning Session: High Lipoprotein(a) 101.  Participant verbalized understanding of test results and what to do next: to ask their healthcare practitioner to test Lp(a) level, advise first-degree relatives to be screened, and work to reduce all cardiovascular risk factors within their control, especially LDL cholesterol.

## 2022-11-06 NOTE — Progress Notes (Signed)
Patient seen today for AA Heart.  He does not have identified ASCVD.  He does have COPD, and recently was seen with Covid.  He is feeling well at the moment. EF is slightly reduced at 40-45% but it is not focal.  I explained this to the patient including possible causes and suggested he discuss with his primary care MD.  He was not aware.  Healthy lifestyle, and daily BP checks, and weight loss were all suggested.   The objectives of AA Heart were described to the patient by [place] Chalmers and myself, and he has consented to testing.    BP 140/99 T97.4 R 20 Wt 309lbs.  P 90 Large neck without obvious JVD Alert, oriented without focal neur defects.  Lungs clear in upper airfields with minimal basilar ronchii and slight prolonged expiration.  Cor  regular.  No obvious murmur appreciated Abd - Obese. Non tender.  No obvious HS megaly Ext - no definite edema  Patient enrolled in AA Heart.  LIfestyle measures discussed.   Follow up with primary care MD suggested.    Arturo Morton. Riley Kill, MD, [facility]

## 2022-11-10 IMAGING — XA DG FLUORO GUIDE NDL PLC/BX
1 series · 1 of 1 positions shown · non-contrast
Comparison: none

Addendum:
CLINICAL DATA: Patient with left shoulder pain after lifting
furniture who presents for left shoulder arthrogram prior to MRI.

[Series 1: ortho adipose · 1 of 1 slices shown]
[im 1/1]
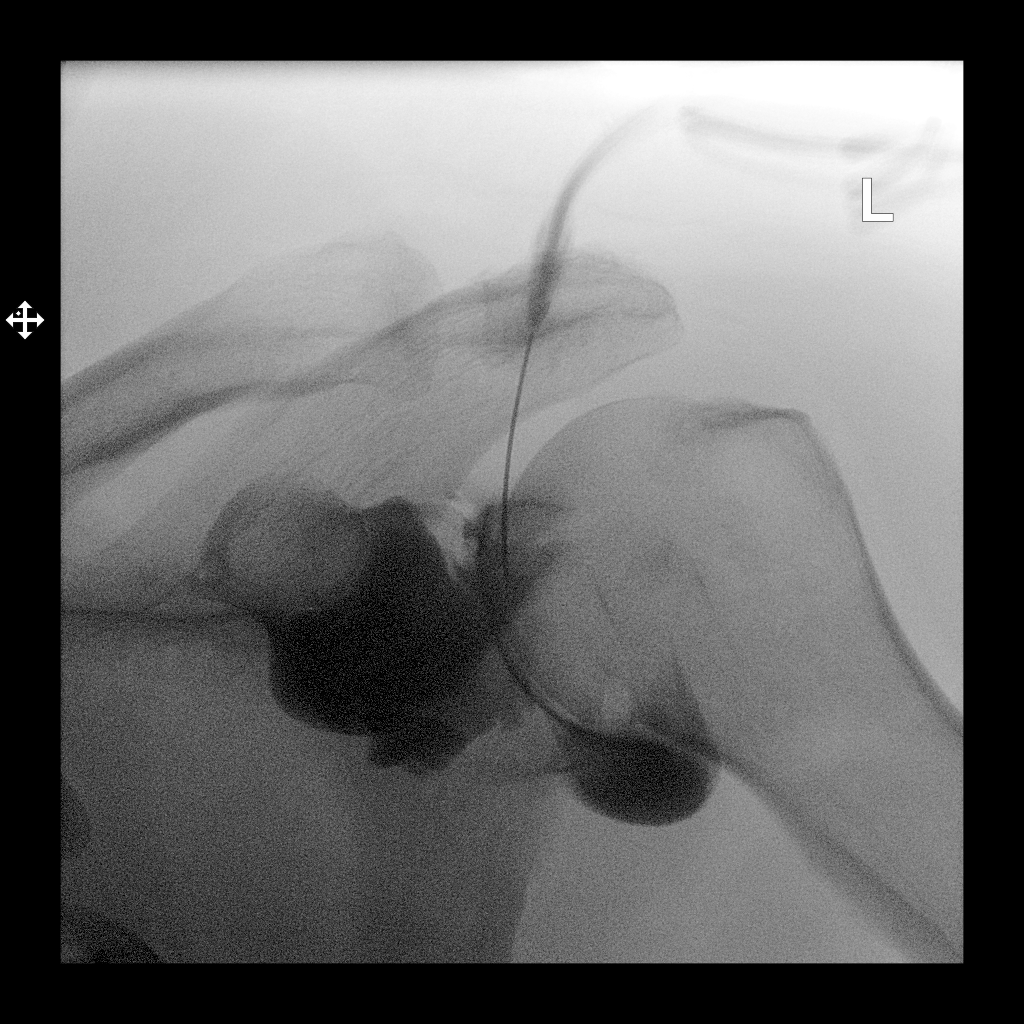

[1 of 1 positions shown; findings below may reference images not displayed]

EXAM:
Leftshoulder ARTHROGRAM

FLUOROSCOPY TIME:  Radiation Exposure Index (as provided by the
fluoroscopic device): 25.96 microgray meter squared

Fluoroscopy Time: 20 seconds

Number of Acquired Images: 1

PROCEDURE:
The risks and benefits of the procedure were discussed with the
patient in right informed consent was obtained. A formal time-out
procedure was performed with the patient according to departmental
protocol.

Patient was placed supine on the fluoroscopy table and the left
glenohumeral joint was identified under fluoroscopy. Skin overlying
the left glenohumeral joint was subsequently cleaned with Betadine
and a sterile drape was placed over the area of interest. 5 mL 1%
lidocaine was used to anesthetize the skin around the needle
insertion site.

A 22 gauge spinal needle was inserted into the left glenohumeral
joint under fluoroscopy.

15 mL gadolinium mixture (0.1 mL of Multihance mixed with 15 mL of
Isovue-M 200 contrsat and 5 mL sterile saline) was injected into the
left glenohumeral joint.

The needle was removed and hemostasis was achieved.

COMPLICATIONS:
No immediate complications
IMPRESSION: Technically successful left shoulder injection for mri.

Read by Minentle, Lechani

ADDENDUM:
The patient was unable to tolerate MRI because of claustrophobia.

*** End of Addendum ***
EXAM:
Leftshoulder ARTHROGRAM

FLUOROSCOPY TIME:  Radiation Exposure Index (as provided by the
fluoroscopic device): 25.96 microgray meter squared

Fluoroscopy Time: 20 seconds

Number of Acquired Images: 1

PROCEDURE:
The risks and benefits of the procedure were discussed with the
patient in right informed consent was obtained. A formal time-out
procedure was performed with the patient according to departmental
protocol.

Patient was placed supine on the fluoroscopy table and the left
glenohumeral joint was identified under fluoroscopy. Skin overlying
the left glenohumeral joint was subsequently cleaned with Betadine
and a sterile drape was placed over the area of interest. 5 mL 1%
lidocaine was used to anesthetize the skin around the needle
insertion site.

A 22 gauge spinal needle was inserted into the left glenohumeral
joint under fluoroscopy.

15 mL gadolinium mixture (0.1 mL of Multihance mixed with 15 mL of
Isovue-M 200 contrsat and 5 mL sterile saline) was injected into the
left glenohumeral joint.

The needle was removed and hemostasis was achieved.

COMPLICATIONS:
No immediate complications
IMPRESSION: Technically successful left shoulder injection for mri.

Read by Minentle, Lechani

## 2023-04-15 ENCOUNTER — Other Ambulatory Visit (HOSPITAL_COMMUNITY): Payer: Self-pay

## 2023-04-15 ENCOUNTER — Encounter: Payer: Self-pay | Admitting: Family Medicine

## 2023-04-15 ENCOUNTER — Ambulatory Visit (INDEPENDENT_AMBULATORY_CARE_PROVIDER_SITE_OTHER): Payer: BLUE CROSS/BLUE SHIELD | Admitting: Family Medicine

## 2023-04-15 VITALS — BP 166/90 | HR 102 | Ht 71.0 in | Wt 315.8 lb

## 2023-04-15 DIAGNOSIS — M25511 Pain in right shoulder: Secondary | ICD-10-CM

## 2023-04-15 DIAGNOSIS — Z23 Encounter for immunization: Secondary | ICD-10-CM | POA: Diagnosis not present

## 2023-04-15 DIAGNOSIS — E119 Type 2 diabetes mellitus without complications: Secondary | ICD-10-CM

## 2023-04-15 DIAGNOSIS — J4489 Other specified chronic obstructive pulmonary disease: Secondary | ICD-10-CM

## 2023-04-15 DIAGNOSIS — G8929 Other chronic pain: Secondary | ICD-10-CM

## 2023-04-15 DIAGNOSIS — K219 Gastro-esophageal reflux disease without esophagitis: Secondary | ICD-10-CM | POA: Diagnosis not present

## 2023-04-15 DIAGNOSIS — I10 Essential (primary) hypertension: Secondary | ICD-10-CM | POA: Diagnosis not present

## 2023-04-15 LAB — POCT GLYCOSYLATED HEMOGLOBIN (HGB A1C): HbA1c, POC (controlled diabetic range): 6.7 % (ref 0.0–7.0)

## 2023-04-15 MED ORDER — METFORMIN HCL ER 500 MG PO TB24: 1000.0000 mg | ORAL_TABLET | Freq: Every day | ORAL | 3 refills | Status: DC

## 2023-04-15 MED ORDER — LISINOPRIL 10 MG PO TABS: 10.0000 mg | ORAL_TABLET | Freq: Every day | ORAL | 0 refills | Status: DC

## 2023-04-15 MED ORDER — ALBUTEROL SULFATE HFA 108 (90 BASE) MCG/ACT IN AERS
2.0000 | INHALATION_SPRAY | Freq: Four times a day (QID) | RESPIRATORY_TRACT | 11 refills | Status: DC | PRN
Start: 2023-04-15 — End: 2023-11-04

## 2023-04-15 MED ORDER — FELODIPINE ER 5 MG PO TB24: 5.0000 mg | ORAL_TABLET | Freq: Every day | ORAL | 0 refills | Status: DC

## 2023-04-15 MED ORDER — BLOOD GLUCOSE MONITORING SUPPL DEVI
1.0000 | Freq: Three times a day (TID) | 0 refills | Status: AC
Start: 2023-04-15 — End: ?

## 2023-04-15 MED ORDER — FAMOTIDINE 20 MG PO TABS: 20.0000 mg | ORAL_TABLET | Freq: Every day | ORAL | 2 refills | Status: DC

## 2023-04-15 MED ORDER — BUDESONIDE-FORMOTEROL FUMARATE 80-4.5 MCG/ACT IN AERO
2.0000 | INHALATION_SPRAY | Freq: Two times a day (BID) | RESPIRATORY_TRACT | 3 refills | Status: DC
Start: 2023-04-15 — End: 2023-04-22

## 2023-04-15 MED ORDER — ATORVASTATIN CALCIUM 40 MG PO TABS: 40.0000 mg | ORAL_TABLET | Freq: Every day | ORAL | 1 refills | Status: DC

## 2023-04-15 MED ORDER — GABAPENTIN 300 MG PO CAPS: 300.0000 mg | ORAL_CAPSULE | Freq: Every day | ORAL | 1 refills | Status: DC

## 2023-04-15 NOTE — Patient Instructions (Addendum)
It was wonderful to see you today! Thank you for choosing [facility].   Please bring ALL of your medications with you to every visit.   Today we talked about:  I refilled all of your medications today, they should be available at your pharmacy soon.  Please continue to monitor your blood sugars at home with a goal fasting blood sugar between 90 and 120. Your blood pressure is mildly elevated today please continue your home medications.  Will recheck it at your follow-up and if persistently high can consider medication adjustments at that time. For your right shoulder I think a better option could be to send you to sports medicine to have a ultrasounded and they could potentially inject you for pain management.  I will place that referral today and this way we can avoid possible MRI include insurance difficulties that can follow-up. It was very nice to meet you today!  I know we have other things to discuss so please schedule follow-up at the front that we we can continue to address your concerns.  Please follow up 3-4 weeks  Call the clinic at [phone] if your symptoms worsen or you have any concerns.  Please be sure to schedule follow up at the front desk before you leave today.   Elberta Fortis, DO Family Medicine

## 2023-04-15 NOTE — Progress Notes (Unsigned)
Subjective:    Patient ID: Levi Blankenship, male    DOB: 1960-07-23, 63 y.o.   MRN: [id number]   CC: New Patient  Right shoulder Had a fall in his driveway in the summer 2024 landing directly on his right shoulder.  Significant pain at the time, was not seen immediately after but eventually followed up with PCP due to persistent pain and limited range of motion.  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  PCP was pursuing MRI but had some insurance difficulties and he did not obtain it before switching to this clinic.  Difficulty with MRIs due to claustrophobia, needs sedative support to have imaging done.  Diabetes Current Regimen: Metformin 1000mg  daily CBGs: Not checking Last A1c:  Lab Results  Component Value Date   HGBA1C 6.7 04/15/2023    Denies polyuria, polydipsia, hypoglycemia. Last Eye Exam: Discuss at follow-up* Statin: Atorvastatin 40 mg daily ACE/ARB: Lisinopril 10 mg daily  HPI: PMHx: Past Medical History:  Diagnosis Date   Allergy    Blood in stool    OVER A MONTH AGO PER PT WIFE ON 11-27-2020   Claustrophobia 11/27/2020   COPD (chronic obstructive pulmonary disease) (HCC)    COVID    SPRING 2022 COUGH CHILLS SOB X 4 DAYS ALL SYMPTOMS REOLVED   GERD (gastroesophageal reflux disease)    Hemorrhoids    Hypertension    MILD ANXIETY 11/27/2020   NO MEDS TAKEN   MILD DEPRESSION 11/27/2020   NO MEDS TAKEN   Mild intermittent asthma without complication 02/17/2019   New onset type 2 diabetes mellitus (HCC) 12/01/2018   Prolapsed hemorrhoids 11/27/2020   Rotator cuff tear, left    DONE SEVERAL MNTHS AGO, PER PT WIFE ON 11-27-2020 HAS PAIN WITH CAN MOVE ARM OK   Sleep apnea    not currently using c-pap   Urinary frequency 11/27/2020   Wears partial dentures 11/27/2020   UPPER AND LOWER     Surgical Hx: Past Surgical History:  Procedure Laterality Date   BOWEL RESECTION      About 5-6 years ago   COLONSCOPY  10/10/2020   LABAUER GI WITH PRECANCEROUS AREAS REMOVED   EVALUATION UNDER ANESTHESIA WITH HEMORRHOIDECTOMY N/A 11/30/2020   Procedure: EXCISION OF PROLAPSING ANAL MASS, HEMORRHOIDECTOMY, HEMORRHOIDOPEXY, ANORECTAL EXAMINATION UNDER ANESTHESIA;  Surgeon: Karie Soda, MD;  Location: [facility];  Service: General;  Laterality: N/A;  GEN & LOCAL   TONSILLECTOMY     MORE THAN 20 YRS AGO, ADDENOIDS REMOVED ALSO   tumor neck     benign and removed 15 YRS AGO     Family Hx: Family History  Problem Relation Age of Onset   Diabetes Father    Heart disease Brother    Colon cancer Maternal Grandmother        dx. 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) Food/transportation/housing insecurity: None  Who would speak for you about health care matters: Bonita Quin  Living will or Advance Directive: Does not have advanced directive-give packet next visit Seen at [facility]  in [place] - 15 years he has been a patient there  Medications: Albuterol as needed ASA 81 mg daily Atorvastatin 40 mg daily Symbicort 2 puffs daily Cetirizine 10 mg and levocetirizine 5 mg as needed (alternates between them) Famotidine 20 mg daily Felodipine 5 mg daily Gabapentin 300 mg daily DuoNebs as needed Lisinopril 10 mg daily Metformin 1000 mg daily   Preventative Screening Colonoscopy: [facility] - 2022, has not followed up.  Some concern for polyps at that time may need repeat screening.  PSA: 2023, normal DEXA: Not done AAA: Still smoking occasionally, good candidate for screening at 64  Smoking status reviewed   Objective:  BP (!) 166/90   Pulse (!) 102    Ht 5\' 11"  (1.803 m)   Wt (!) 315 lb 12.8 oz (143.2 kg)   SpO2 100%   BMI 44.05 kg/m  Vitals and nursing note reviewed  General: well nourished, in no acute distress HEENT: normocephalic, no scleral icterus or conjunctival pallor, no nasal discharge, moist mucous membranes, good dentition without erythema or discharge noted in posterior oropharynx Neck: supple, non-tender, without lymphadenopathy Cardiac: RRR, clear S1 and S2, no murmurs, rubs, or gallops Respiratory: clear to auscultation bilaterally, no increased work of breathing Abdomen: soft, nontender, nondistended, no masses or organomegaly. Extremities: no edema or cyanosis. Warm, well perfused. 2+ radial pulses bilaterally Skin: warm and dry, no rashes noted Neuro: alert and oriented, no focal deficits MSK: Difficulty with shoulder flexion requiring engagement of trapezius.  4/5 right shoulder flexion, 5/5 left shoulder flexion.  Right shoulder abduction with assistance to around 100-110 degrees.  No significant pain with palpation.  5/5 grip strength bilaterally.  Distal sensation intact bilaterally.  Good cap refill.  Assessment & Plan:   Assessment & Plan Type 2 diabetes mellitus without complication, without long-term current use of insulin (HCC) A1c 6.7, consistent with prior reading of 7.0 about a year ago.  Doing well on metformin 1000 mg daily but given desire for weight loss, may consider changing to GLP-1 for additional added benefit. -Refill metformin and glucometer supplies -ACR and discuss diabetic eye exam at follow-up -Consider switching to GLP-1 for weight loss benefit COPD with asthma (HCC) Symptomatically controlled but only using Symbicort 2 puffs daily, recommend increasing to 2 puffs twice daily given occasional need for albuterol and DuoNeb symptomatic relief. -Refill Symbicort 2 puffs twice daily Hypertension, benign 166/90 upon repeat, on Falodipine 5 mg daily lisinopril 10 mg daily.  Has not been  checking at home, recommend home BP log and return in 3-4 weeks to discuss readings.  Will likely need adjustment of medication management. 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  Shoulder x-ray reviewed, likely significant arthritic changes glenohumeral and acromioclavicular joints.  Would benefit from ultrasound evaluation and possible injection, therefore refer to sports medicine for further care. -Referral to sports medicine    Concerns to discuss at next visit: -Insomnia -Left knee pain -Impotence -Needs discussion about vaccinations including DTaP and Prevnar -Provided advanced directive packet   Elberta Fortis, DO

## 2023-04-16 ENCOUNTER — Encounter: Payer: Self-pay | Admitting: Family Medicine

## 2023-04-16 NOTE — Assessment & Plan Note (Signed)
166/90 upon repeat, on Falodipine 5 mg daily lisinopril 10 mg daily.  Has not been checking at home, recommend home BP log and return in 3-4 weeks to discuss readings.  Will likely need adjustment of medication management. -Refill Felodipine and lisinopril -Consider adjustment to combination pill follow-up

## 2023-04-16 NOTE — Assessment & Plan Note (Signed)
A1c 6.7, consistent with prior reading of 7.0 about a year ago.  Doing well on metformin 1000 mg daily but given desire for weight loss, may consider changing to GLP-1 for additional added benefit. -Refill metformin and glucometer supplies -ACR and discuss diabetic eye exam at follow-up -Consider switching to GLP-1 for weight loss benefit

## 2023-04-17 ENCOUNTER — Telehealth: Payer: Self-pay

## 2023-04-17 DIAGNOSIS — J4489 Other specified chronic obstructive pulmonary disease: Secondary | ICD-10-CM

## 2023-04-17 NOTE — Telephone Encounter (Signed)
Pharmacy Patient Advocate Encounter   Received notification from CoverMyMeds that prior authorization for [facility] INHALER is required/requested.   Insurance verification completed.   The patient is insured through [facility] .   PA required; PA submitted to above mentioned insurance via CoverMyMeds Key/confirmation #/EOC [id number]. Status is pending  Generic symbicort is covered however is unavailable for pharmacies to order.

## 2023-04-18 NOTE — Telephone Encounter (Signed)
Pharmacy Patient Advocate Encounter  Received notification from [facility] that Prior Authorization for Levi Blankenship has been APPROVED from 04/18/23 to 05/16/23   PA #/Case ID/Reference #: [id number]

## 2023-04-21 NOTE — Telephone Encounter (Signed)
Called pharmacy to advise on PA approval.   She reports the medication is still 197 dollars.   Spoke with patients wife and they can not afford this.   Advised will forward to PCP for possible alternative.

## 2023-04-22 ENCOUNTER — Ambulatory Visit
Admission: RE | Admit: 2023-04-22 | Discharge: 2023-04-22 | Disposition: A | Discharge status: Home / Self Care | Payer: No Typology Code available for payment source | Source: Ambulatory Visit | Attending: Family Medicine | Admitting: Family Medicine

## 2023-04-22 ENCOUNTER — Ambulatory Visit: Payer: Self-pay | Admitting: Family Medicine

## 2023-04-22 ENCOUNTER — Ambulatory Visit: Payer: Self-pay

## 2023-04-22 ENCOUNTER — Other Ambulatory Visit (HOSPITAL_COMMUNITY): Payer: Self-pay

## 2023-04-22 ENCOUNTER — Other Ambulatory Visit: Payer: Self-pay | Admitting: Family Medicine

## 2023-04-22 ENCOUNTER — Encounter: Payer: Self-pay | Admitting: Family Medicine

## 2023-04-22 VITALS — BP 142/98 | Ht 71.0 in | Wt 310.0 lb

## 2023-04-22 DIAGNOSIS — M25562 Pain in left knee: Secondary | ICD-10-CM | POA: Diagnosis not present

## 2023-04-22 DIAGNOSIS — E119 Type 2 diabetes mellitus without complications: Secondary | ICD-10-CM

## 2023-04-22 DIAGNOSIS — M75121 Complete rotator cuff tear or rupture of right shoulder, not specified as traumatic: Secondary | ICD-10-CM

## 2023-04-22 DIAGNOSIS — I10 Essential (primary) hypertension: Secondary | ICD-10-CM

## 2023-04-22 DIAGNOSIS — M25511 Pain in right shoulder: Secondary | ICD-10-CM

## 2023-04-22 DIAGNOSIS — G8929 Other chronic pain: Secondary | ICD-10-CM

## 2023-04-22 DIAGNOSIS — M75120 Complete rotator cuff tear or rupture of unspecified shoulder, not specified as traumatic: Secondary | ICD-10-CM | POA: Insufficient documentation

## 2023-04-22 MED ORDER — FLUTICASONE-SALMETEROL 100-50 MCG/ACT IN AEPB: 1.0000 | INHALATION_SPRAY | Freq: Two times a day (BID) | RESPIRATORY_TRACT | 2 refills | Status: DC

## 2023-04-22 MED ORDER — METHYLPREDNISOLONE ACETATE 40 MG/ML IJ SUSP
40.0000 mg | Freq: Once | INTRAMUSCULAR | Status: AC
  Administered 2023-04-22: 40 mg via INTRA_ARTICULAR

## 2023-04-22 NOTE — Assessment & Plan Note (Signed)
-   The patient is exam has no focal findings.  Given his history and description of pain it is likely he has some degree of osteoarthritis. - We will get x-rays and call him with the results.  Will follow-up once we have this.

## 2023-04-22 NOTE — Assessment & Plan Note (Signed)
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  It is likely he ruptured these with his fall a year ago.  At this point he is not a good candidate for surgical repair. - We discussed the options for treatment today. - Subacromial injection today as below.  Hopefully this will give him some relief and allow him to participate in physical therapy - Physical therapy prescription given today. - We will follow-up in 4 to 6 weeks.  Right subacromial Injection Procedure: After informed written consent timeout was performed, patient was in seated position on exam table.  The right shoulder was prepped with alcohol swab x2. Ethyl chloride spray used for topical anesthetic. Utilizing the posterior approach and a 25g needle, the right subacromial bursa was injected with 3:1 lidocaine :depomedrol.  Following the injection a bandage was applied to the area. Patient tolerated procedure well without immediate complications. The patient was counseled as to the expected post-injection course, including the possibility of worsening of pain with steroid flare. Instructed as to concerning symptoms and advised to contact the office if these should arise.  Printed material was given.

## 2023-04-22 NOTE — Progress Notes (Signed)
 Jaysin L. Denning - 63 y.o. male MRN [id number]  Date of birth: [birth year]  PCP: Theophilus Pagan, MD  Subjective:  No chief complaint on file. Right shoulder pain and left knee pain  HPI: Past Medical, Surgical, Social, and Family History Reviewed & Updated per EMR.   Patient is a 63 y.o. male here for right, intermittent, aching, nonradiating pain in the right shoulder that is worsened with movement and alleviated with rest.  He fell on the right shoulder a year ago in his backyard and had some mild pain but reports no pain at rest now.  He is unable to lift his arm past 90 degrees in flexion or abduction because of pain and tightness.  He has not had any numbness or weakness in the upper extremity as neck pain.  The patient has not tried any physical therapy or injections.  He also has pain in his left knee which she has had for years.  He has not had any previous injuries to this but does report a history of playing football at a younger age.  Pertinent PMHx: Type 2 diabetes, BMI of 44, hypertension  Past Surgical History:  Procedure Laterality Date   BOWEL RESECTION     About 5-6 years ago   COLONSCOPY  10/10/2020   LABAUER GI WITH PRECANCEROUS AREAS REMOVED   EVALUATION UNDER ANESTHESIA WITH HEMORRHOIDECTOMY N/A 11/30/2020   Procedure: EXCISION OF PROLAPSING ANAL MASS, HEMORRHOIDECTOMY, HEMORRHOIDOPEXY, ANORECTAL EXAMINATION UNDER ANESTHESIA;  Surgeon: Sheldon Standing, MD;  Location: [facility];  Service: General;  Laterality: N/A;  GEN & LOCAL   TONSILLECTOMY     MORE THAN 20 YRS AGO, ADDENOIDS REMOVED ALSO   tumor neck     benign and removed 15 YRS AGO    No Known Allergies      Objective:  Physical Exam: VS: BP:(!) 142/98  HR: bpm  TEMP: ( )  RESP:   HT:5' 11 (180.3 cm)   WT:(!) 310 lb (140.6 kg)  BMI:43.26  Gen: Well developed, NAD, speaks clearly, comfortable in exam room Respiratory: Normal work of breathing on room air, no respiratory distress Skin:  No rashes, abrasions, or ecchymosis MSK:   Right Shoulder: Inspection no obvious abnormality ROM: Restricted to 85 degrees in flexion and 80 degrees and abduction, internal rotation just past midline, and external rotation to 25 degrees. Passively 100 degrees of flexion, 90 degrees of abduction, external rotation to 45 degrees, and internal rotation past midline Strength: 5/5 in all directions AC joint: non tender Unable to perform empty can, crossover, speeds, O'Brien's due to lack of range of motion and pain. Neuro: DTRs equal bilaterally, no sensory deficits  Left knee: Inspection: Some chronic boxy changes but no obvious deformity ROM: 0-120 degrees in extension and flexion Strength: 55/5 Palpation w/ no warmth, no effusion present, no posterior tenderness no joint line tenderness to palpation  no tenderness over Gerdy's tubercle, pes bursa, or tibial tuberosity no patellar tenderness, translation NT no condyle tenderness. Gait: Slightly antalgic favoring left Neuro: No sensory deficits distally   Ultrasound of right shoulder:  Biceps Tendon SAX and LAX: visualized in bicipital groove proximally and then found medial to the groove over an anterior osteophyte more distally. Diffuse hypoechoic changes along the tendon seen in LAX.  Subscapularis tendon - viewed in SAX and LAX inserting into the inferior lesser tubercle of humerus. echogenics: hypoechoic changes within the tendon confirmed in both views [facility] joint -visualized with elevation of the distal clavicle and osteophyte formation.  The acromion is seen depressed to this, also with osteophyte formation.   -There is a large calcification inferior to the distal end of the acromion at the [facility] joint  Supraspinatus tendon - distal insertion point viewed in SAX and LAX, there is no insertional fibers at superior facet of greater tubercle of the humerus w/ echogenics: hypoechoic fluid present , dynamic view w/ bursal fluid accumulation at  the acromion  Infraspinatus and Teres minor tendons - viewed in LAX and SAX at the middle facet of the greater tubercle of humerus w/ echogenics: no infraspinatus fibers discernable. There is hypoechoic fluid in it's place w/ degenerative changes of the distal insertion point  Posterior glenohumeral joint visualized w/ some degernative changes of the humeral head and labrum w/ a small collection of hypoechoic fluid.   Summary: Subluxation of the biceps tendon over a fairly large anterior osteophyte.  Complete tear of the distal supraspinatus and infraspinatus.  There is a large osteophyte inferior to the distal end of the acromion at the [facility] joint with elevation of the clavicle concerning for previous AC separation. There are also degenerative changes of the posterior GHJ w/ a small effusion.   Ultrasound and interpretation by Dr. Janet and Dr. Teressa  Assessment & Plan:   Left knee pain - The patient is exam has no focal findings.  Given his history and description of pain it is likely he has some degree of osteoarthritis. - We will get x-rays and call him with the results.  Will follow-up once we have this.  Full thickness rotator cuff tear - Teancum has full-thickness tears of the right supraspinatus and infraspinatus that seem to be chronic.  It is likely he ruptured these with his fall a year ago.  At this point he is not a good candidate for surgical repair. - We discussed the options for treatment today. - Subacromial injection today as below.  Hopefully this will give him some relief and allow him to participate in physical therapy - Physical therapy prescription given today. - We will follow-up in 4 to 6 weeks.  Right subacromial Injection Procedure: After informed written consent timeout was performed, patient was in seated position on exam table.  The right shoulder was prepped with alcohol swab x2. Ethyl chloride spray used for topical anesthetic. Utilizing the posterior approach and a  25g needle, the right subacromial bursa was injected with 3:1 lidocaine :depomedrol.  Following the injection a bandage was applied to the area. Patient tolerated procedure well without immediate complications. The patient was counseled as to the expected post-injection course, including the possibility of worsening of pain with steroid flare. Instructed as to concerning symptoms and advised to contact the office if these should arise.  Printed material was given.    Lonni Janet MD [facility] Medicine Fellow

## 2023-04-22 NOTE — Addendum Note (Signed)
Addended by: Elberta Fortis on: 04/22/2023 04:55 PM   Modules accepted: Orders

## 2023-04-22 NOTE — Patient Instructions (Signed)

## 2023-04-23 NOTE — Telephone Encounter (Signed)
 Patient's wife Ysabel Cowgill) has been informed. Alain Howard CMA

## 2023-04-28 ENCOUNTER — Encounter: Payer: Self-pay | Admitting: Family Medicine

## 2023-05-07 NOTE — Progress Notes (Unsigned)
    SUBJECTIVE:   CHIEF COMPLAINT / HPI:   Diabetes Current Regimen: Metformin 1000 mg daily  CBGs: Not checking  Last A1c:  Lab Results  Component Value Date   HGBA1C 6.7 04/15/2023    Last Eye Exam: DUE - needs referral Statin: Atorvastatin 40 mg daily ACE/ARB: Olmesartan 20 mg daily  Hypertension: - Medications: Falodipine 5 mg daily, lisinopril 10 mg daily  - Compliance: Yes - Checking BP at home: None - Denies any SOB, CP, vision changes, LE edema, medication SEs, or symptoms of hypotension  Left knee pain Seen by sports medicine, obtained knee x-ray which showed moderate OA and possible past MCL injury.  He states his left knee pain is interfering with sleep becoming bothersome.  Follow-up with sports med on 3/11.  PERTINENT  PMH / PSH: T2DM, HTN, HLD, left knee OA, torn rotator cuff of right shoulder, COPD, asthma, GERD  OBJECTIVE:   BP (!) 148/94   Pulse 99   Ht 5\' 11"  (1.803 m)   Wt (!) 313 lb 9.6 oz (142.2 kg)   SpO2 96%   BMI 43.74 kg/m    General: NAD, pleasant, able to participate in exam Cardiac: RRR, no murmurs. Respiratory: CTAB, normal effort, No wheezes, rales or rhonchi Abdomen: Soft, nondistended Extremities: no edema or cyanosis. Skin: warm and dry, no rashes noted Neuro: alert, no obvious focal deficits Psych: Normal affect and mood MSK: Mild knee edema noted.  Pain with manipulation, particularly with valgus strain.  Normal gait.  ASSESSMENT/PLAN:   Assessment & Plan Type 2 diabetes mellitus without complication, without long-term current use of insulin (HCC) A1c 6.7 in 03/2023 patient desires additional weight loss.  Pharmacy team ran test claim, Ozempic preferred on insurance.  Will transition off of metformin onto GLP-1. -Stop metformin 1000 mg daily -Start Ozempic 0.25 mg x 4 weeks and increase to 0.5 mg -F/u in 1 month to assess sugars -Referred to ophthalmologist for diabetic eye care -Obtain BMP, lipid and ACR Hypertension,  benign Elevated on multiple recent office visits, will transition to combination pill. -Stop felodipine 5 mg daily and lisinopril 10 mg daily -Start olmesartan-amlodipine 20-10 daily Osteoarthritis of left knee, unspecified osteoarthritis type Pain limiting ADLs, agreeable to steroid injection.  Successfully performed as below.  Follow-up with sports medicine in 3/11 as scheduled.   *Provided advance care directive packet as discussed at last visit  Written and verbal consent was obtained after discussing the risks and benefits of the procedure with the patient. A timeout was performed and the correct site and side was identified and confirmed.  The left knee was cleaned in sterile fashion betadine and alcohol pad. 40 mg Depo-medrol and 3 cc 1% Lidocaine was injected using a medial approach using a 5 cc syringe and 25 gauge 1 and 1/2 in needle. No complications were encountered. Minimal blood loss. A band aid was applied.  Aftercare instructions provided.   Dr. Elberta Fortis, DO [facility]

## 2023-05-10 ENCOUNTER — Other Ambulatory Visit: Payer: Self-pay | Admitting: Family Medicine

## 2023-05-10 DIAGNOSIS — I10 Essential (primary) hypertension: Secondary | ICD-10-CM

## 2023-05-12 ENCOUNTER — Other Ambulatory Visit (HOSPITAL_COMMUNITY): Payer: Self-pay

## 2023-05-12 ENCOUNTER — Ambulatory Visit: Payer: No Typology Code available for payment source | Admitting: Family Medicine

## 2023-05-12 ENCOUNTER — Encounter: Payer: Self-pay | Admitting: Family Medicine

## 2023-05-12 VITALS — BP 148/94 | HR 99 | Ht 71.0 in | Wt 313.6 lb

## 2023-05-12 DIAGNOSIS — Z1159 Encounter for screening for other viral diseases: Secondary | ICD-10-CM

## 2023-05-12 DIAGNOSIS — I10 Essential (primary) hypertension: Secondary | ICD-10-CM

## 2023-05-12 DIAGNOSIS — E119 Type 2 diabetes mellitus without complications: Secondary | ICD-10-CM | POA: Diagnosis not present

## 2023-05-12 DIAGNOSIS — M1712 Unilateral primary osteoarthritis, left knee: Secondary | ICD-10-CM | POA: Diagnosis not present

## 2023-05-12 DIAGNOSIS — M25562 Pain in left knee: Secondary | ICD-10-CM

## 2023-05-12 MED ORDER — METHYLPREDNISOLONE ACETATE 40 MG/ML IJ SUSP
40.0000 mg | Freq: Once | INTRAMUSCULAR | Status: AC
  Administered 2023-05-12: 40 mg via INTRAMUSCULAR

## 2023-05-12 MED ORDER — AMLODIPINE-OLMESARTAN 10-20 MG PO TABS: 1.0000 | ORAL_TABLET | Freq: Every day | ORAL | 1 refills | Status: DC

## 2023-05-12 NOTE — Patient Instructions (Addendum)
 It was wonderful to see you today! Thank you for choosing [facility].   Please bring ALL of your medications with you to every visit.   Today we talked about:  Please try to monitor your blood pressure at home with goal blood pressure less than 130/80.  Please stop taking your felodipine and lisinopril and start taking Azor which is a combination blood pressure pill.  Will follow-up in 1 month to see how your blood pressure is doing on this new medication given it was high your past couple of visits. For your diabetes continue to take the metformin for now but I messaged our pharmacy team to see if you would be a good candidate for this once weekly injectable medications given he would also like to lose weight.  I will follow-up with you regarding the cost and discuss further if that is a good option for you. I also referred you to a diabetic eye doctor, our office will call you with that referral information to schedule yearly diabetic retinopathy exam. We are doing a steroid injection of your left knee for pain, please see the aftercare instructions below and follow-up with a sports medicine doctor as scheduled on 3/11. We are doing some urine studies and blood work today to check on your diabetes and kidney function.  I will follow-up with those results on MyChart and if anything is abnormal I will call to discuss further.  Please follow up in 1 month for blood pressure check   We are checking some labs today. If they are abnormal, I will call you. If they are normal, I will send you a MyChart message (if it is active) or a letter in the mail. If you do not hear about your labs in the next 2 weeks, please call the office.  Call the clinic at [phone] if your symptoms worsen or you have any concerns.  Please be sure to schedule follow up at the front desk before you leave today.   Levi Fortis, DO Family Medicine    Joint Injection, Care After Refer to this sheet in  the next few weeks. These instructions provide you with information about caring for yourself after your procedure. Your health care provider may also give you more specific instructions. Your treatment has been planned according to current medical practices, but problems sometimes occur. Call your health care provider if you have any problems or questions after your procedure. What can I expect after the procedure? After the procedure, it is common to have: Soreness. Warmth. Swelling. You may have more pain, swelling, and warmth than you did before the injection. This reaction may last for about one day. Follow these instructions at home: Bathing If you were given a bandage (dressing), keep it dry until your health care provider says it can be removed. Ask your health care provider when you can start showering or taking a bath. Managing pain, stiffness, and swelling If directed, apply ice to the injection area: Put ice in a plastic bag. Place a towel between your skin and the bag. Leave the ice on for 20 minutes, 2-3 times per day. Do not apply heat to your knee. Raise the injection area above the level of your heart while you are sitting or lying down. Activity Avoid strenuous activities for as long as directed by your health care provider. Ask your health care provider when you can return to your normal activities. General instructions Take medicines only as directed by your health  care provider. Do not take aspirin or other over-the-counter medicines unless your health care provider says you can. Check your injection site every day for signs of infection. Watch for: Redness, swelling, or pain. Fluid, blood, or pus. Follow your health care provider's instructions about dressing changes and removal. Contact a health care provider if: You have symptoms at your injection site that last longer than two days after your procedure. You have redness, swelling, or pain in your injection  area. You have fluid, blood, or pus coming from your injection site. You have warmth in your injection area. You have a fever. Your pain is not controlled with medicine. Get help right away if:   Blood Pressure Record Sheet To take your blood pressure, you will need a blood pressure machine. You can buy a blood pressure machine (blood pressure monitor) at your clinic, drug store, or online. When choosing one, consider: An automatic monitor that has an arm cuff. A cuff that wraps snugly around your upper arm. You should be able to fit only one finger between your arm and the cuff. A device that stores blood pressure reading results. Do not choose a monitor that measures your blood pressure from your wrist or finger. Follow your health care provider's instructions for how to take your blood pressure. To use this form: Take your blood pressure medications every day These measurements should be taken when you have been at rest for at least 10-15 min Take at least 2 readings with each blood pressure check. This makes sure the results are correct. Wait 1-2 minutes between measurements. Write down the results in the spaces on this form. Keep in mind it should always be recorded systolic over diastolic. Both numbers are important.  Repeat this every day for 2-3 weeks, or as told by your health care provider.  Make a follow-up appointment with your health care provider to discuss the results.  Blood Pressure Log Date Medications taken? (Y/N) Blood Pressure Time of Day

## 2023-05-13 LAB — LIPID PANEL
Chol/HDL Ratio: 2.4 {ratio} (ref 0.0–5.0)
Cholesterol, Total: 80 mg/dL — ABNORMAL LOW (ref 100–199)
HDL: 33 mg/dL — ABNORMAL LOW (ref >39)
LDL Chol Calc (NIH): 30 mg/dL (ref 0–99)
Triglycerides: 84 mg/dL (ref 0–149)
VLDL Cholesterol Cal: 17 mg/dL (ref 5–40)

## 2023-05-13 LAB — HCV INTERPRETATION

## 2023-05-13 LAB — BASIC METABOLIC PANEL
BUN/Creatinine Ratio: 11 (ref 10–24)
BUN: 11 mg/dL (ref 8–27)
CO2: 24 mmol/L (ref 20–29)
Calcium: 8.9 mg/dL (ref 8.6–10.2)
Chloride: 100 mmol/L (ref 96–106)
Creatinine, Ser: 1.02 mg/dL (ref 0.76–1.27)
Glucose: 104 mg/dL — ABNORMAL HIGH (ref 70–99)
Potassium: 4.4 mmol/L (ref 3.5–5.2)
Sodium: 142 mmol/L (ref 134–144)
eGFR: 83 mL/min/{1.73_m2} (ref >59)

## 2023-05-13 LAB — MICROALBUMIN / CREATININE URINE RATIO
Creatinine, Urine: 302.7 mg/dL
Microalb/Creat Ratio: 16 mg/g{creat} (ref 0–29)
Microalbumin, Urine: 49.9 ug/mL

## 2023-05-13 LAB — HCV AB W REFLEX TO QUANT PCR: HCV Ab: NONREACTIVE

## 2023-05-13 MED ORDER — SEMAGLUTIDE(0.25 OR 0.5MG/DOS) 2 MG/1.5ML ~~LOC~~ SOPN: PEN_INJECTOR | SUBCUTANEOUS | 1 refills | Status: AC

## 2023-05-13 NOTE — Assessment & Plan Note (Signed)
 Pain limiting ADLs, agreeable to steroid injection.  Successfully performed as below.  Follow-up with sports medicine in 3/11 as scheduled.

## 2023-05-13 NOTE — Assessment & Plan Note (Signed)
 Elevated on multiple recent office visits, will transition to combination pill. -Stop felodipine 5 mg daily and lisinopril 10 mg daily -Start olmesartan-amlodipine 20-10 daily

## 2023-05-13 NOTE — Assessment & Plan Note (Signed)
 A1c 6.7 in 03/2023 patient desires additional weight loss.  Pharmacy team ran test claim, Ozempic preferred on insurance.  Will transition off of metformin onto GLP-1. -Stop metformin 1000 mg daily -Start Ozempic 0.25 mg x 4 weeks and increase to 0.5 mg -F/u in 1 month to assess sugars -Referred to ophthalmologist for diabetic eye care -Obtain BMP, lipid and ACR

## 2023-05-15 ENCOUNTER — Encounter: Payer: Self-pay | Admitting: Family Medicine

## 2023-05-26 MED ORDER — ACCU-CHEK AVIVA PLUS VI STRP: ORAL_STRIP | 12 refills | Status: AC

## 2023-05-27 ENCOUNTER — Ambulatory Visit: Payer: No Typology Code available for payment source | Admitting: Family Medicine

## 2023-05-27 ENCOUNTER — Encounter: Payer: Self-pay | Admitting: Family Medicine

## 2023-05-27 VITALS — BP 157/99 | Ht 71.0 in | Wt 300.0 lb

## 2023-05-27 DIAGNOSIS — S46011D Strain of muscle(s) and tendon(s) of the rotator cuff of right shoulder, subsequent encounter: Secondary | ICD-10-CM | POA: Diagnosis not present

## 2023-05-27 DIAGNOSIS — M1712 Unilateral primary osteoarthritis, left knee: Secondary | ICD-10-CM | POA: Insufficient documentation

## 2023-05-27 NOTE — Assessment & Plan Note (Signed)
-   Fortunately Levi Blankenship is markedly improved after his injection and ongoing physical therapy.  His range of motion is increased and pain is minimal. -We will continue with physical therapy until complete - Follow-up as needed.

## 2023-05-27 NOTE — Progress Notes (Signed)
 Levi Blankenship - 63 y.o. male MRN [id number]  Date of birth: [birth year]  PCP: Elberta Fortis, MD  Subjective:  No chief complaint on file. Right rotator cuff tear and left knee osteoarthritis  HPI: Past Medical, Surgical, Social, and Family History Reviewed & Updated per EMR.   Patient is a 63 y.o. male here for follow up on right shoulder and left knee pain, last seen on 04/22/2023. The patient responded very well to steroid injection and has been doing physical therapy consistently.  He reports increased range of motion of the right shoulder and pain is almost completely resolved.  His left knee does not bother him when ambulating or during the daytime but does cause him some pain at night when laying down.  He is not trying any topical medications, ice, or assistive devices.  He denies any fevers, chills, or unexplained weight loss.  Past Medical History:  Diagnosis Date   Allergy    Claustrophobia 11/27/2020   COPD (chronic obstructive pulmonary disease) (HCC)    COVID    SPRING 2022 COUGH CHILLS SOB X 4 DAYS ALL SYMPTOMS REOLVED   GERD (gastroesophageal reflux disease)    Hemorrhoids    Hypertension    MILD ANXIETY 11/27/2020   NO MEDS TAKEN   MILD DEPRESSION 11/27/2020   NO MEDS TAKEN   Mild intermittent asthma without complication 02/17/2019   Prolapsed hemorrhoids 11/27/2020   Rotator cuff tear, left    DONE SEVERAL MNTHS AGO, PER PT WIFE ON 11-27-2020 HAS PAIN WITH CAN MOVE ARM OK   Sleep apnea    not currently using c-pap   Type 2 diabetes 12/01/2018   Urinary frequency 11/27/2020   Wears partial dentures 11/27/2020   UPPER AND LOWER    Current Outpatient Medications on File Prior to Visit  Medication Sig Dispense Refill   Accu-Chek Softclix Lancets lancets SMARTSIG:Topical (Patient not taking: Reported on 09/10/2022)     albuterol (VENTOLIN HFA) 108 (90 Base) MCG/ACT inhaler Inhale 2 puffs into the lungs every 6 (six) hours as needed for wheezing or shortness of  breath. 18 g 11   amlodipine-olmesartan (AZOR) 10-20 MG tablet Take 1 tablet by mouth daily. 30 tablet 1   aspirin EC 81 MG tablet Take 81 mg by mouth daily.     atorvastatin (LIPITOR) 40 MG tablet Take 1 tablet (40 mg total) by mouth daily. 90 tablet 1   Blood Glucose Monitoring Suppl DEVI 1 each by Does not apply route in the morning, at noon, and at bedtime. May substitute to any manufacturer covered by patient's insurance. 1 each 0   cetirizine (ZYRTEC) 10 MG tablet Take 10 mg by mouth as needed for allergies.     famotidine (PEPCID) 20 MG tablet Take 1 tablet (20 mg total) by mouth daily. 30 tablet 2   fluticasone-salmeterol (WIXELA INHUB) 100-50 MCG/ACT AEPB Inhale 1 puff into the lungs 2 (two) times daily. 60 each 2   gabapentin (NEURONTIN) 300 MG capsule Take 1 capsule (300 mg total) by mouth daily. 90 capsule 1   glucose blood (ACCU-CHEK AVIVA PLUS) test strip Use to test blood sugar 3x per day. E11.9 100 each 12   ipratropium-albuterol (DUONEB) 0.5-2.5 (3) MG/3ML SOLN Take 3 mLs by nebulization every 4 (four) hours as needed. 360 mL 1   levocetirizine (XYZAL) 5 MG tablet TAKE 1 TABLET BY MOUTH ONCE DAILY IN THE EVENING. PT NEEDS OV FOR ANY ADDITIONAL REFILLS.     Semaglutide,0.25 or 0.5MG /DOS, 2 MG/1.5ML SOPN Inject  0.25 mg into the skin once a week for 28 days, THEN 0.5 mg once a week for 28 days. 3 mL 1   No current facility-administered medications on file prior to visit.    Past Surgical History:  Procedure Laterality Date   BOWEL RESECTION     About 5-6 years ago   COLONSCOPY  10/10/2020   LABAUER GI WITH PRECANCEROUS AREAS REMOVED   EVALUATION UNDER ANESTHESIA WITH HEMORRHOIDECTOMY N/A 11/30/2020   Procedure: EXCISION OF PROLAPSING ANAL MASS, HEMORRHOIDECTOMY, HEMORRHOIDOPEXY, ANORECTAL EXAMINATION UNDER ANESTHESIA;  Surgeon: Karie Soda, MD;  Location: [facility];  Service: General;  Laterality: N/A;  GEN & LOCAL   TONSILLECTOMY     MORE THAN 20 YRS AGO,  ADDENOIDS REMOVED ALSO   tumor neck     benign and removed 15 YRS AGO    No Known Allergies      Objective:  Physical Exam: VS: BP:(!) 157/99  HR: bpm  TEMP: ( )  RESP:   HT:5\' 11"  (180.3 cm)   WT:300 lb (136.1 kg)  BMI:41.86  Gen: NAD, speaks clearly, comfortable in exam room Respiratory: Normal respiratory effort on room air. No signs of distress Skin: No rashes, abrasions, or ecchymosis MSK:  Right Shoulder: Inspection no abnormality ROM: Active flexion to 120 degrees, abduction to 115 degrees Strength: 5/5 Special tests: Hawkins, crossover, empty can negative  Left knee: Inspection: No abnormality ROM: Full with mild crepitation Strength: 5/5 Palpation w/ no warmth, no effusion present no joint line tenderness to palpation     Assessment & Plan:   Full thickness rotator cuff tear - Fortunately [name] is markedly improved after his injection and ongoing physical therapy.  His range of motion is increased and pain is minimal. -We will continue with physical therapy until complete - Follow-up as needed.  Osteoarthritis of left knee - The patient is doing much better. - We discussed continuing conservative care with quadricep strengthening exercises at home, ice, Voltaren gel, and a compression sleeve - We will see him back as needed.    Rica Mote MD [facility] Sports Medicine Fellow

## 2023-05-27 NOTE — Assessment & Plan Note (Signed)
-   The patient is doing much better. - We discussed continuing conservative care with quadricep strengthening exercises at home, ice, Voltaren gel, and a compression sleeve - We will see him back as needed.

## 2023-05-27 NOTE — Patient Instructions (Signed)
 Start applying ice to your knee for 15-20 min just before bed for extra relief.  You can apply Voltaren gel 2-3 times a day directly over the front of the knee if it helps You can also pick up a compression knee sleeve and wear that for comfort.  Start daily quad strengthening exercises.  We will see you in 4-6 weeks if your pain is worse

## 2023-06-16 ENCOUNTER — Telehealth: Payer: Self-pay

## 2023-06-16 ENCOUNTER — Other Ambulatory Visit (HOSPITAL_COMMUNITY): Payer: Self-pay

## 2023-06-16 DIAGNOSIS — J4489 Other specified chronic obstructive pulmonary disease: Secondary | ICD-10-CM

## 2023-06-16 NOTE — Telephone Encounter (Signed)
 Patients insurance requires a 90 day RX for fluticasone-salmeterol. Patient was able to fill once for a 30 day supply, however he will need more refills to complete the 90 day fill.   Please send a 90 RX for generic Wixela to patients pharmacy, with applicable refills. Thanks!

## 2023-06-18 MED ORDER — FLUTICASONE-SALMETEROL 100-50 MCG/ACT IN AEPB: 1.0000 | INHALATION_SPRAY | Freq: Two times a day (BID) | RESPIRATORY_TRACT | 2 refills | Status: DC

## 2023-06-28 DIAGNOSIS — Z419 Encounter for procedure for purposes other than remedying health state, unspecified: Secondary | ICD-10-CM | POA: Diagnosis not present

## 2023-07-11 NOTE — Progress Notes (Signed)
 SUBJECTIVE:   CHIEF COMPLAINT / HPI:   Hypertension: - Medications: Olmesartan -amlodipine  20-10 mg daily - Compliance: Yes - Denies any SOB, CP, vision changes, LE edema, medication SEs, or symptoms of hypotension  Diabetes Current Regimen: Metformin  1000 mg daily, plan to start Ozempic  0.5 mg weekly (did not start it yet - was going be $100-205 day copay) CBGs: Not checking Last A1c:  Lab Results  Component Value Date   HGBA1C 6.7 04/15/2023    Denies polyuria, polydipsia, hypoglycemia Last Eye Exam: Referred at last visit Statin: Atorvastatin  40 mg daily ACE/ARB: Olmesartan  20 mg daily  Bilateral knee pain Received steroid injection of left knee in the office on 05/08/2023. Right knee now also has pain. Voltaren gel does not help.  Requesting another steroid injection.  Rash Present in groin folds for the past week.  Reports it does have some drainage at time.  Itchy but nonpainful.  Never had prior similar rash.  Not present on any other area of his body.  Denies fever or other systemic symptoms.  Thinks it has been slowly improving.  Cough X 1 week.  Associated with some wheezing and on and off shortness of breath for couple days but has since improved. Using breathing treatments help, using albuterol  about twice per week in addition to Wixela inhaler.  Denies any swelling in his legs or abdomen. No sick contacts. Albuterol  twice per week. H/o COPD  PERTINENT  PMH / PSH: T2DM, HTN, HLD, left knee OA, torn rotator cuff of right shoulder, COPD, asthma, GERD   OBJECTIVE:   BP 116/88   Pulse (!) 103   Ht 5\' 11"  (1.803 m)   Wt (!) 311 lb (141.1 kg)   SpO2 97%   BMI 43.38 kg/m    General: NAD, pleasant, able to participate in exam Cardiac: RRR, no murmurs. Respiratory: Normal respiratory effort on room air.  Mild expiratory wheezing noted diffusely.  No coughing during exam. Abdomen: Bowel sounds present, nontender, nondistended Extremities: no edema or  cyanosis. Skin: warm and dry.  Bilateral inguinal folds with multiple open lesions with some surrounding drainage and possible underlying tracts. Neuro: alert, no obvious focal deficits Psych: Normal affect and mood  ASSESSMENT/PLAN:   Assessment & Plan Type 2 diabetes mellitus without complication, without long-term current use of insulin  (HCC) Did not obtain Ozempic  due to insurance issues but is now on Medicaid, will retrial transition from metformin  to Ozempic . -Start Ozempic  0.25 mg weekly x 4 weeks then increase to 0.5 mg weekly after Primary osteoarthritis of both knees Pain affecting ADLs, not due for repeat steroid injection until 08/05/2023.  Will give short course anti-inflammatory given lack of symptom relief with supportive care. -Naproxen  500 mg twice daily x 7 days -Return around 5/20 for bilateral steroid injections Hypertension, benign 116/88, well-controlled on current regimen. Tubular adenoma of colon Previous removed in 2022, did not follow-up with GI due to insurance issues.  Would like to discuss at follow-up Hidradenitis suppurativa Exam most consistent with underlying tracts and sclerosis of skin of bilateral inguinal folds.  Appears to be in flare with some drainage from different sites but mostly resolving. -Advised keep the area clean and dry and free of moisture -Mupirocin ointment twice daily x 7 days for symptom resolution Acute cough Mild wheezing upon exam, overall symptomatically improving.  Patient declined additional workup or medication management.  Will continue to monitor but does not meet criteria for COPD exacerbation at this time.  Otherwise reassuring exam with normal  work of breathing.     Dr. Jonne Netters, DO [place] [facility]

## 2023-07-14 ENCOUNTER — Ambulatory Visit: Admitting: Family Medicine

## 2023-07-14 ENCOUNTER — Encounter: Payer: Self-pay | Admitting: Family Medicine

## 2023-07-14 VITALS — BP 116/88 | HR 103 | Ht 71.0 in | Wt 311.0 lb

## 2023-07-14 DIAGNOSIS — R051 Acute cough: Secondary | ICD-10-CM

## 2023-07-14 DIAGNOSIS — L732 Hidradenitis suppurativa: Secondary | ICD-10-CM

## 2023-07-14 DIAGNOSIS — E119 Type 2 diabetes mellitus without complications: Secondary | ICD-10-CM

## 2023-07-14 DIAGNOSIS — I10 Essential (primary) hypertension: Secondary | ICD-10-CM | POA: Diagnosis not present

## 2023-07-14 DIAGNOSIS — D126 Benign neoplasm of colon, unspecified: Secondary | ICD-10-CM | POA: Diagnosis not present

## 2023-07-14 DIAGNOSIS — Z7985 Long-term (current) use of injectable non-insulin antidiabetic drugs: Secondary | ICD-10-CM

## 2023-07-14 DIAGNOSIS — M170 Bilateral primary osteoarthritis of knee: Secondary | ICD-10-CM

## 2023-07-14 MED ORDER — NAPROXEN 500 MG PO TABS: 500.0000 mg | ORAL_TABLET | Freq: Two times a day (BID) | ORAL | 0 refills | Status: AC

## 2023-07-14 MED ORDER — SEMAGLUTIDE(0.25 OR 0.5MG/DOS) 2 MG/1.5ML ~~LOC~~ SOPN: PEN_INJECTOR | SUBCUTANEOUS | 3 refills | Status: AC

## 2023-07-14 MED ORDER — MUPIROCIN 2 % EX OINT
1.0000 | TOPICAL_OINTMENT | Freq: Two times a day (BID) | CUTANEOUS | 0 refills | Status: AC
Start: 2023-07-14 — End: 2023-07-21

## 2023-07-14 NOTE — Assessment & Plan Note (Signed)
 Pain affecting ADLs, not due for repeat steroid injection until 08/05/2023.  Will give short course anti-inflammatory given lack of symptom relief with supportive care. -Naproxen  500 mg twice daily x 7 days -Return around 5/20 for bilateral steroid injections

## 2023-07-14 NOTE — Assessment & Plan Note (Signed)
 116/88, well-controlled on current regimen.

## 2023-07-14 NOTE — Assessment & Plan Note (Signed)
 Previous removed in 2022, did not follow-up with GI due to insurance issues.  Would like to discuss at follow-up

## 2023-07-14 NOTE — Assessment & Plan Note (Signed)
 Did not obtain Ozempic  due to insurance issues but is now on Medicaid, will retrial transition from metformin  to Ozempic . -Start Ozempic  0.25 mg weekly x 4 weeks then increase to 0.5 mg weekly after

## 2023-07-14 NOTE — Patient Instructions (Addendum)
 It was wonderful to see you today! Thank you for choosing [facility].   Please bring ALL of your medications with you to every visit.   Today we talked about:  For your diabetes I am sending in the Ozempic  again since your insurance changed.  As we discussed please take 0.25 mg weekly for the first 4 weeks and after that you can increase to 0.5 mg weekly.  Once you start this medication you can stop using the metformin  as this will likely hold your blood sugars adequately. For your knee pain you can use the naproxen  sparingly.  As we discussed it is not a medication I recommend long-term but given we want to get you through to the next knee injection in a few weeks I think it is appropriate.  Please return around 08/05/2023 and we can do both knee injections at that time. For the rash it does look like a superficial bacterial infection.  Please use the mupirocin ointment twice per day for the next week or until the rash has resolved. We can discuss referring you back to GI doctor for colonoscopy at your next visit. I hope your cough continues to improve, can please continue to use your inhalers and if it worsens please let me know and we can consider oral steroids for treatment.  Please follow up in 1 month for knee injections  Call the clinic at [phone] if your symptoms worsen or you have any concerns.  Please be sure to schedule follow up at the front desk before you leave today.   Jonne Netters, DO Family Medicine

## 2023-07-15 ENCOUNTER — Telehealth: Payer: Self-pay

## 2023-07-15 ENCOUNTER — Other Ambulatory Visit: Payer: Self-pay | Admitting: Family Medicine

## 2023-07-15 DIAGNOSIS — I10 Essential (primary) hypertension: Secondary | ICD-10-CM

## 2023-07-15 NOTE — Telephone Encounter (Signed)
 Pharmacy Patient Advocate Encounter   Received notification from CoverMyMeds that prior authorization for OZEMPIC  0.25/0.5MG  is required/requested.   Insurance verification completed.   The patient is insured through [facility] .   PA required; PA submitted to above mentioned insurance via CoverMyMeds Key/confirmation #/EOC [id number]. Status is pending

## 2023-07-16 NOTE — Telephone Encounter (Signed)
 Pharmacy Patient Advocate Encounter  Received notification from [facility] that Prior Authorization for OZEMPIC  has been APPROVED from 07/15/23 to 07/14/24

## 2023-07-17 ENCOUNTER — Other Ambulatory Visit (HOSPITAL_COMMUNITY): Payer: Self-pay

## 2023-07-17 NOTE — Telephone Encounter (Signed)
 Contacted pharmacy to give copay card info.   (Correction: copay card covers $100 of patients out of pocket for a month supply, with as little as a $25 copay with coupon)  Per pharmacy: card rejected; govt' insurance although it is commercial. Copay remains $200

## 2023-07-17 NOTE — Telephone Encounter (Signed)
 Patient's wife reports that [employer] insurance is ending within the next few days.   Pharmacist reports that they are unable to run medication through medicaid while [employer] is active.   Advised wife that she should contact pharmacy once [employer] is deactivated and attempt to process through medicaid.   Wife will call back if there are any additional issues.   Elsie Halo, RN

## 2023-07-17 NOTE — Telephone Encounter (Signed)
 Received call from wife regarding prior authorization for Ozempic .   Called pharmacy. They report that medication goes through insurance, however, has a copayment of $200.   Aron Lard- does this have to do with patient being in coverage gap? Wife is also asking about possible medication assistance. Please advise.   Elsie Halo, RN

## 2023-07-31 ENCOUNTER — Encounter: Payer: Self-pay | Admitting: Family Medicine

## 2023-07-31 ENCOUNTER — Ambulatory Visit: Admitting: Family Medicine

## 2023-07-31 VITALS — BP 124/86 | HR 92 | Temp 98.1°F | Ht 71.0 in | Wt 315.0 lb

## 2023-07-31 DIAGNOSIS — J00 Acute nasopharyngitis [common cold]: Secondary | ICD-10-CM

## 2023-07-31 MED ORDER — FLUTICASONE PROPIONATE 50 MCG/ACT NA SUSP: 2.0000 | Freq: Every day | NASAL | 6 refills | Status: AC

## 2023-07-31 NOTE — Progress Notes (Signed)
    SUBJECTIVE:   CHIEF COMPLAINT / HPI:   Cold - 2-3 weeks, no phlegm is coming up - No cough, has rhinorrhea and PND - Occasional chills, but no known fever - Has only taken Tylenol  occasionally for headaches and Zyrtec  daily. Tried breathing treatments, but hasn't been doing it daily - Mild SOB with activities, but not at rest or with talking - No longer smokes   PERTINENT  PMH / PSH: COPD  OBJECTIVE:   BP 124/86   Pulse 92   Temp 98.1 F (36.7 C) (Oral)   Ht 5\' 11"  (1.803 m)   Wt (!) 315 lb (142.9 kg)   SpO2 95%   BMI 43.93 kg/m   General: Awake and Alert in NAD HEENT: NCAT. Sclera anicteric. No rhinorrhea. Cardiovascular: RRR. No M/R/G Respiratory: CTAB, normal WOB on RA. No wheezing, crackles, rhonchi, or diminished breath sounds. Abdomen: Soft, non-tender, non-distended. Bowel sounds normoactive Extremities: Able to move all extremities. No BLE edema, no deformities or significant joint findings. Skin: Warm and dry. No abrasions or rashes noted. Neuro: A&Ox3. No focal neurological deficits.  ASSESSMENT/PLAN:   Assessment & Plan Acute nasopharyngitis (common cold) Cold symptoms for about 2 to 3 weeks.  Unlikely to be COPD exacerbation due to no cough, no phlegm production, and mild with very occasional and intermittent wheezing.  No SOB with talking or at rest, only with activities - Advised Zyrtec  and Flonase  daily (prescribed today) to help with rhinorrhea and postnasal drip - Advised taking the Wixela inhaler for maintenance. - Follow-up as needed   Raphel Stickles, DO [place] [facility]

## 2023-07-31 NOTE — Patient Instructions (Addendum)
 It was great to see you today! Thank you for choosing [facility] for your primary care. Levi Blankenship was seen for cold.  Today we addressed: Cold - using Zyrtec  and Flonase  daily to help improve symptoms. I also recommend using your Wixela inhaler daily as that is your maintenance inhaler and Albuterol  as needed. Staying hydrated is also very important  You should return to our clinic No follow-ups on file. Please arrive 15 minutes before your appointment to ensure smooth check in process.  We appreciate your efforts in making this happen.  Thank you for allowing me to participate in your care, Clyda Dark, DO 07/31/2023, 10:12 AM PGY-1, [facility]

## 2023-08-08 ENCOUNTER — Ambulatory Visit: Admitting: Family Medicine

## 2023-08-08 ENCOUNTER — Ambulatory Visit: Payer: Self-pay | Admitting: Student

## 2023-08-08 ENCOUNTER — Encounter: Payer: Self-pay | Admitting: Student

## 2023-08-08 ENCOUNTER — Ambulatory Visit (INDEPENDENT_AMBULATORY_CARE_PROVIDER_SITE_OTHER): Admitting: Student

## 2023-08-08 VITALS — BP 111/81 | HR 105 | Ht 71.0 in | Wt 315.2 lb

## 2023-08-08 DIAGNOSIS — J00 Acute nasopharyngitis [common cold]: Secondary | ICD-10-CM | POA: Insufficient documentation

## 2023-08-08 DIAGNOSIS — Z23 Encounter for immunization: Secondary | ICD-10-CM

## 2023-08-08 DIAGNOSIS — E119 Type 2 diabetes mellitus without complications: Secondary | ICD-10-CM | POA: Diagnosis not present

## 2023-08-08 DIAGNOSIS — M170 Bilateral primary osteoarthritis of knee: Secondary | ICD-10-CM

## 2023-08-08 LAB — POCT GLYCOSYLATED HEMOGLOBIN (HGB A1C): HbA1c, POC (controlled diabetic range): 7.1 % — AB (ref 0.0–7.0)

## 2023-08-08 MED ORDER — METHYLPREDNISOLONE ACETATE 40 MG/ML IJ SUSP
40.0000 mg | Freq: Once | INTRAMUSCULAR | Status: AC
  Administered 2023-08-08: 40 mg via INTRAMUSCULAR

## 2023-08-08 NOTE — Patient Instructions (Signed)
 It was great to see you! Thank you for allowing me to participate in your care!  I recommend that you always bring your medications to each appointment as this makes it easy to ensure we are on the correct medications and helps us  not miss when refills are needed.  Our plans for today:  - Cough / Nasal congestion  Take zyrtec  and Flonase  (2 spray each nostril) daily Make follow up appointment if cough becomes worse, you develop fevers, or start to feel under the weather / ill  - Knee Arthritis Bilateral knee injections today.It will be at least 3 months before your next injections is possible.  Make follow up appointment if you develop any issues!  Take care and seek immediate care sooner if you develop any concerns.   Dr. Wilhemena Harbour, MD [facility]

## 2023-08-08 NOTE — Assessment & Plan Note (Signed)
 PROCEDURE: INJECTION: Patient was given informed consent, signed copy in the chart. Appropriate time out was taken. Area prepped and draped in usual sterile fashion. Ethyl chloride was  used for local anesthesia. A 21 gauge 1 1/2 inch needle was used.. 1 cc of methylprednisolone  40 mg/ml plus  4 cc of 1% lidocaine  without epinephrine  was injected into the bilateral knees using a(n) anterior medial approach.   The patient tolerated the procedure well. There were no complications. Post procedure instructions were given.

## 2023-08-08 NOTE — Assessment & Plan Note (Addendum)
 PROCEDURE: INJECTION: Patient was given informed consent, signed copy in the chart. Appropriate time out was taken. Area prepped and draped in usual sterile fashion. Ethyl chloride was  used for local anesthesia. A 21 gauge 1 1/2 inch needle was used.. 1 cc of methylprednisolone  40 mg/ml plus  4 cc of 1% lidocaine  without epinephrine  was injected into bilateral knees using a(n) anterior medial approach.   The patient tolerated the procedure well. There were no complications. Post procedure instructions were given.

## 2023-08-08 NOTE — Progress Notes (Signed)
  SUBJECTIVE:   CHIEF COMPLAINT / HPI:   Nasopharyngitis Meds: Flonase   Reports Today Still having congestion and phlegm, but notes not using Flonase  regularly, no fevers or systemic symptoms, does not have body aches or feel under the weather.   Knee OA Patient come's in for BL knee injections. Notes left knee has flared and is hurting him.     PERTINENT  PMH / PSH:   OBJECTIVE:  BP 111/81   Pulse (!) 105   Ht 5\' 11"  (1.803 m)   Wt (!) 315 lb 3.2 oz (143 kg)   SpO2 95%   BMI 43.96 kg/m  Physical Exam Musculoskeletal:     Right knee: No swelling, deformity, effusion, erythema, ecchymosis, lacerations, bony tenderness or crepitus. Normal range of motion. Tenderness present. No medial joint line or lateral joint line tenderness. No LCL laxity, MCL laxity, ACL laxity or PCL laxity. Normal alignment, normal meniscus and normal patellar mobility.     Left knee: No swelling, deformity, effusion, erythema, ecchymosis, lacerations, bony tenderness or crepitus. Normal range of motion. No tenderness. No medial joint line or lateral joint line tenderness. No LCL laxity, MCL laxity, ACL laxity or PCL laxity.Normal alignment, normal meniscus and normal patellar mobility.      ASSESSMENT/PLAN:   Assessment & Plan Primary osteoarthritis of both knees PROCEDURE: INJECTION: Patient was given informed consent, signed copy in the chart. Appropriate time out was taken. Area prepped and draped in usual sterile fashion. Ethyl chloride was  used for local anesthesia. A 21 gauge 1 1/2 inch needle was used.. 1 cc of methylprednisolone  40 mg/ml plus  4 cc of 1% lidocaine  without epinephrine  was injected into bilateral knees using a(n) anterior medial approach.   The patient tolerated the procedure well. There were no complications. Post procedure instructions were given.  Acute nasopharyngitis (common cold) Patient comes in for follow-up of acute nasopharyngitis.  Patient reports he is doing  slightly better, since starting Zyrtec , has not been taking Flonase  regularly.  Patient denies any fevers or systemic symptoms.  Patient does not feel ill or under the weather.  Suspect patient will need to use Flonase  daily to help with symptoms. - Continue Zyrtec  daily - Continue Flonase  daily No follow-ups on file. Wilhemena Harbour, MD 08/08/2023, 2:10 PM PGY-3, [facility]

## 2023-08-08 NOTE — Assessment & Plan Note (Signed)
 Patient comes in for follow-up of acute nasopharyngitis.  Patient reports he is doing slightly better, since starting Zyrtec , has not been taking Flonase  regularly.  Patient denies any fevers or systemic symptoms.  Patient does not feel ill or under the weather.  Suspect patient will need to use Flonase  daily to help with symptoms. - Continue Zyrtec  daily - Continue Flonase  daily

## 2023-08-10 ENCOUNTER — Other Ambulatory Visit: Payer: Self-pay | Admitting: Family Medicine

## 2023-08-10 DIAGNOSIS — K219 Gastro-esophageal reflux disease without esophagitis: Secondary | ICD-10-CM

## 2023-09-10 ENCOUNTER — Other Ambulatory Visit: Payer: Self-pay | Admitting: Family Medicine

## 2023-09-10 DIAGNOSIS — K219 Gastro-esophageal reflux disease without esophagitis: Secondary | ICD-10-CM

## 2023-09-19 ENCOUNTER — Other Ambulatory Visit: Payer: Self-pay | Admitting: Family Medicine

## 2023-09-19 DIAGNOSIS — K219 Gastro-esophageal reflux disease without esophagitis: Secondary | ICD-10-CM

## 2023-09-22 ENCOUNTER — Encounter: Payer: Self-pay | Admitting: Family Medicine

## 2023-09-22 DIAGNOSIS — E119 Type 2 diabetes mellitus without complications: Secondary | ICD-10-CM

## 2023-09-22 MED ORDER — ATORVASTATIN CALCIUM 40 MG PO TABS
40.0000 mg | ORAL_TABLET | Freq: Every day | ORAL | 1 refills | Status: DC
Start: 2023-09-22 — End: 2023-11-04

## 2023-09-22 MED ORDER — GABAPENTIN 300 MG PO CAPS
300.0000 mg | ORAL_CAPSULE | Freq: Every day | ORAL | 1 refills | Status: DC
Start: 2023-09-22 — End: 2023-11-04

## 2023-09-25 ENCOUNTER — Encounter: Payer: Self-pay | Admitting: Family Medicine

## 2023-11-03 NOTE — Progress Notes (Unsigned)
    SUBJECTIVE:   CHIEF COMPLAINT / HPI:   Chronic left knee OA Repeat cortisone injection.  Diabetes Current Regimen: Metformin  1000 mg daily, plan to start Ozempic  0.5 mg weekly  CBGs: ***  Last A1c:  Lab Results  Component Value Date   HGBA1C 7.1 (A) 08/08/2023    Denies polyuria, polydipsia, hypoglycemia *** Last Eye Exam: Referred previously Statin: Atorvastatin  40 mg daily ACE/ARB: Olmesartan  20 mg daily  *Colonoscopy  PERTINENT  PMH / PSH: ***  OBJECTIVE:   There were no vitals taken for this visit. ***  General: NAD, pleasant, able to participate in exam Cardiac: RRR, no murmurs. Respiratory: CTAB, normal effort, No wheezes, rales or rhonchi Abdomen: Bowel sounds present, nontender, nondistended Extremities: no edema or cyanosis. Skin: warm and dry, no rashes noted Neuro: alert, no obvious focal deficits Psych: Normal affect and mood  ASSESSMENT/PLAN:   No problem-specific Assessment & Plan notes found for this encounter.     Dr. Izetta Nap, DO  [facility]    {    This will disappear when note is signed, click to select method of visit    :1}

## 2023-11-04 ENCOUNTER — Encounter: Payer: Self-pay | Admitting: Family Medicine

## 2023-11-04 ENCOUNTER — Ambulatory Visit (INDEPENDENT_AMBULATORY_CARE_PROVIDER_SITE_OTHER): Admitting: Family Medicine

## 2023-11-04 VITALS — BP 206/169 | HR 100 | Ht 70.0 in | Wt 319.8 lb

## 2023-11-04 DIAGNOSIS — D369 Benign neoplasm, unspecified site: Secondary | ICD-10-CM | POA: Diagnosis not present

## 2023-11-04 DIAGNOSIS — N529 Male erectile dysfunction, unspecified: Secondary | ICD-10-CM | POA: Diagnosis not present

## 2023-11-04 DIAGNOSIS — E1140 Type 2 diabetes mellitus with diabetic neuropathy, unspecified: Secondary | ICD-10-CM | POA: Diagnosis not present

## 2023-11-04 DIAGNOSIS — M1712 Unilateral primary osteoarthritis, left knee: Secondary | ICD-10-CM | POA: Diagnosis not present

## 2023-11-04 DIAGNOSIS — J4489 Other specified chronic obstructive pulmonary disease: Secondary | ICD-10-CM | POA: Diagnosis not present

## 2023-11-04 DIAGNOSIS — I10 Essential (primary) hypertension: Secondary | ICD-10-CM

## 2023-11-04 DIAGNOSIS — K219 Gastro-esophageal reflux disease without esophagitis: Secondary | ICD-10-CM

## 2023-11-04 DIAGNOSIS — E119 Type 2 diabetes mellitus without complications: Secondary | ICD-10-CM

## 2023-11-04 LAB — POCT GLYCOSYLATED HEMOGLOBIN (HGB A1C): HbA1c, POC (controlled diabetic range): 7.4 % — AB (ref 0.0–7.0)

## 2023-11-04 MED ORDER — TRELEGY ELLIPTA 100-62.5-25 MCG/ACT IN AEPB: INHALATION_SPRAY | RESPIRATORY_TRACT | Status: DC

## 2023-11-04 MED ORDER — SILDENAFIL CITRATE 100 MG PO TABS: 50.0000 mg | ORAL_TABLET | Freq: Every day | ORAL | 11 refills | Status: AC | PRN

## 2023-11-04 MED ORDER — FAMOTIDINE 20 MG PO TABS: 20.0000 mg | ORAL_TABLET | Freq: Every day | ORAL | 0 refills | Status: DC

## 2023-11-04 MED ORDER — UMECLIDINIUM-VILANTEROL 62.5-25 MCG/ACT IN AEPB
1.0000 | INHALATION_SPRAY | Freq: Every day | RESPIRATORY_TRACT | 1 refills | Status: DC
Start: 2023-11-04 — End: 2023-11-04

## 2023-11-04 MED ORDER — METHYLPREDNISOLONE ACETATE 40 MG/ML IJ SUSP
40.0000 mg | Freq: Once | INTRAMUSCULAR | Status: AC
  Administered 2023-11-04: 40 mg via INTRAMUSCULAR

## 2023-11-04 MED ORDER — ALBUTEROL SULFATE HFA 108 (90 BASE) MCG/ACT IN AERS: 2.0000 | INHALATION_SPRAY | Freq: Four times a day (QID) | RESPIRATORY_TRACT | 11 refills | Status: AC | PRN

## 2023-11-04 MED ORDER — ATORVASTATIN CALCIUM 40 MG PO TABS: 40.0000 mg | ORAL_TABLET | Freq: Every day | ORAL | 1 refills | Status: AC

## 2023-11-04 MED ORDER — TRELEGY ELLIPTA 100-62.5-25 MCG/ACT IN AEPB: 1.0000 | INHALATION_SPRAY | Freq: Every day | RESPIRATORY_TRACT | 2 refills | Status: DC

## 2023-11-04 MED ORDER — METFORMIN HCL ER (OSM) 1000 MG PO TB24: 1000.0000 mg | ORAL_TABLET | Freq: Every day | ORAL | 1 refills | Status: DC

## 2023-11-04 MED ORDER — SEMAGLUTIDE(0.25 OR 0.5MG/DOS) 2 MG/1.5ML ~~LOC~~ SOPN: 0.2500 mg | PEN_INJECTOR | SUBCUTANEOUS | 3 refills | Status: DC

## 2023-11-04 MED ORDER — AMLODIPINE-OLMESARTAN 10-20 MG PO TABS: 1.0000 | ORAL_TABLET | Freq: Every day | ORAL | 1 refills | Status: AC

## 2023-11-04 MED ORDER — GABAPENTIN 300 MG PO CAPS: 300.0000 mg | ORAL_CAPSULE | Freq: Every day | ORAL | 1 refills | Status: DC

## 2023-11-04 NOTE — Assessment & Plan Note (Signed)
 A1c 7.4, mildly increased from prior.  Given insurance change will reattempt GLP-1 therapy. -Start Ozempic  0.25 mg weekly x 4 weeks, then increase to 0.5 mg weekly -Refill metformin  1000mg  daily, atorvastatin  and gabapentin  -Repeat A1c in 3 months

## 2023-11-04 NOTE — Assessment & Plan Note (Addendum)
 Significantly elevated today in setting of pain and not taking BP meds.  Asymptomatic, refilled BP meds and will see back in 1 to 2 weeks for repeat. -Refill amlodipine -Olmesartan  10-20mg  - Return for BP check in 1 to 2 weeks

## 2023-11-04 NOTE — Patient Instructions (Signed)
 It was wonderful to see you today! Thank you for choosing [facility].   Please bring ALL of your medications with you to every visit.   Today we talked about:  You received an injection in your left knee today, we can repeat this in 3 months.  Please continue to do your exercises and I think the Ozempic  will help as well as weight loss will your symptoms. I sent in for the Ozempic  for diabetes management.  A1c is 7.4 today but still considered in the controlled range. I sent in the Viagra , I tried to refill 30 pills at a time and has plenty of refills available.  Remember if you take the medication next.  Chest pain or issues where you seek medical attention please let them know that you took it as this can impact care.  Will start with half a tablet and you can progress up to a full tablet if needed.  Member alcohol will dampen the effects medication. I changed her inhaler to the trial which she will which has 3 medications that I think will help with her breathing symptoms.  I provided you with a sample today that you can try and send today to your pharmacy.  You can use the DuoNebs as needed for breathing management as well.  I think once we improved your wheezing the mucus clearance will improve as well. I put in the referral to GI for Dr. Albertus to discuss follow-up colonoscopy given you had that prior tubular adenoma.  Our office will call with this information. I refilled the remainder of your occasions, please let me know if you have any issues or concerns. Your blood pressure is a little high today but it has been controlled on your recent visits.  May be because you took your medication recently.  Please continue to check your blood pressure at home if possible to ensure it is not persistently elevated above 140/90.  Please follow up in 3 months   Call the clinic at [phone] if your symptoms worsen or you have any concerns.  Please be sure to schedule follow up at  the front desk before you leave today.   Izetta Nap, DO Family Medicine

## 2023-11-04 NOTE — Assessment & Plan Note (Signed)
 Received steroid injection as below.  Follow-up in 3 months for repeat as desired.

## 2023-11-05 ENCOUNTER — Encounter: Payer: Self-pay | Admitting: Family Medicine

## 2023-11-05 ENCOUNTER — Telehealth: Payer: Self-pay

## 2023-11-05 DIAGNOSIS — E1140 Type 2 diabetes mellitus with diabetic neuropathy, unspecified: Secondary | ICD-10-CM

## 2023-11-05 NOTE — Telephone Encounter (Signed)
 Prior authorization submitted for OZEMPIC  0.25/0.5MG  to [facility] MEDICAID via Latent.   Key: [id number]

## 2023-11-06 ENCOUNTER — Telehealth: Payer: Self-pay

## 2023-11-06 DIAGNOSIS — J4489 Other specified chronic obstructive pulmonary disease: Secondary | ICD-10-CM

## 2023-11-06 MED ORDER — METFORMIN HCL ER (OSM) 500 MG PO TB24: 1000.0000 mg | ORAL_TABLET | Freq: Every day | ORAL | 1 refills | Status: DC

## 2023-11-06 NOTE — Telephone Encounter (Signed)
 Pharmacy Patient Advocate Encounter  Received notification from [facility] MEDICAID that Prior Authorization for OZEMPIC  0.25/0.5MG  has been APPROVED from 11/05/23 to 11/04/24   PA #/Case ID/Reference #: [id number]

## 2023-11-06 NOTE — Telephone Encounter (Signed)
 Prior authorization submitted for TRELEGY to [facility] MEDICAID via Latent.   Key: [id number]

## 2023-11-06 NOTE — Telephone Encounter (Signed)
 Pharmacy Patient Advocate Encounter  Received notification from [facility] MEDICAID that Prior Authorization for [facility] has been DENIED.  Full denial letter will be uploaded to the media tab. See denial reason below.  Does not meet step therapy criteria; documentation does not confirm failure of two preferred drugs or contraindications for their use.   The preferred drugs: brand Symbicort , brand Advair Diskus, Dulera  PA #/Case ID/Reference #: [id number]

## 2023-11-07 MED ORDER — BUDESONIDE-FORMOTEROL FUMARATE 80-4.5 MCG/ACT IN AERO: 2.0000 | INHALATION_SPRAY | Freq: Two times a day (BID) | RESPIRATORY_TRACT | 12 refills | Status: AC

## 2023-11-07 MED ORDER — METFORMIN HCL ER 500 MG PO TB24: 1000.0000 mg | ORAL_TABLET | Freq: Every day | ORAL | 1 refills | Status: DC

## 2023-11-07 NOTE — Addendum Note (Signed)
 Addended by: THEOPHILUS PAGAN on: 11/07/2023 10:28 AM   Modules accepted: Orders

## 2023-11-07 NOTE — Telephone Encounter (Signed)
 Fortamet  (metformin ) still sent to pharmacy, not covered by insurance.  Please resend as Metformin  ER.

## 2023-11-07 NOTE — Addendum Note (Signed)
 Addended by: THEOPHILUS PAGAN on: 11/07/2023 11:20 AM   Modules accepted: Orders

## 2023-11-07 NOTE — Telephone Encounter (Signed)
 Insurance denied Trelegy inhaler.  Recommending Symbicort , will send in although incomplete management of COPD given underlying COPD and asthma.  If not improved will advance to triple therapy.  Izetta Nap, DO

## 2023-11-10 ENCOUNTER — Encounter: Payer: Self-pay | Admitting: Family Medicine

## 2023-11-11 ENCOUNTER — Ambulatory Visit

## 2023-11-13 ENCOUNTER — Telehealth: Payer: Self-pay

## 2023-11-13 ENCOUNTER — Ambulatory Visit (INDEPENDENT_AMBULATORY_CARE_PROVIDER_SITE_OTHER)

## 2023-11-13 VITALS — BP 148/72 | HR 56

## 2023-11-13 DIAGNOSIS — E1140 Type 2 diabetes mellitus with diabetic neuropathy, unspecified: Secondary | ICD-10-CM

## 2023-11-13 DIAGNOSIS — Z0130 Encounter for examination of blood pressure without abnormal findings: Secondary | ICD-10-CM

## 2023-11-13 NOTE — Telephone Encounter (Signed)
 Patients wife calls nurse line in regards to Metformin  prescription.   Metformin  XR is not covered by patients insurance.   Spoke with pharmacy and his insurance will only cover the regular metformin .  Will forward to PCP to advise.

## 2023-11-13 NOTE — Progress Notes (Signed)
 Patient presents to clinic for BP follow up.   On 11/04/23, BP was 206/169 and 161/138. Pt reports that since this visit, he has been compliant on BP medication, taking last dose this AM around 0500.  Initial BP was 154/92, HR 56. Repeat 13 minutes later was 148/72.   Repeat HR was obtained via radial pulse. Pulse was 52.   Patient denies chest pain, palpitations, lightheadedness. He denies cardiac history.   Spoke with Dr. Delores regarding concern. Advised that patient schedule follow up with PCP within the next week.   PCP does not have availability until the middle of September. Scheduled with Dr. Damien Pinal on 11/20/23.   Return precautions discussed.   Patient voices understanding.   Chiquita JAYSON English, RN

## 2023-11-14 MED ORDER — METFORMIN HCL 500 MG PO TABS: 1000.0000 mg | ORAL_TABLET | Freq: Every day | ORAL | 1 refills | Status: AC

## 2023-11-14 NOTE — Telephone Encounter (Signed)
 Switched from metformin  XR to metformin  1000 mg daily due to insurance coverage issues.  Izetta Nap, DO

## 2023-11-20 ENCOUNTER — Ambulatory Visit: Admitting: Student

## 2023-12-01 ENCOUNTER — Other Ambulatory Visit: Payer: Self-pay | Admitting: Family Medicine

## 2023-12-01 DIAGNOSIS — K219 Gastro-esophageal reflux disease without esophagitis: Secondary | ICD-10-CM

## 2024-01-01 ENCOUNTER — Ambulatory Visit

## 2024-01-02 ENCOUNTER — Ambulatory Visit

## 2024-01-02 ENCOUNTER — Ambulatory Visit: Admitting: Family Medicine

## 2024-01-02 VITALS — BP 118/93 | HR 97 | Ht 70.0 in | Wt 324.0 lb

## 2024-01-02 DIAGNOSIS — Z23 Encounter for immunization: Secondary | ICD-10-CM | POA: Diagnosis not present

## 2024-01-02 DIAGNOSIS — M1712 Unilateral primary osteoarthritis, left knee: Secondary | ICD-10-CM

## 2024-01-02 NOTE — Assessment & Plan Note (Signed)
 Most recent knee injection less than 2 months ago so he is not due for repeat injection until November 20.  Discussed this with him.  He will schedule an appointment for this In the meantime recommend Voltaren gel and trial of Aleve  for acute pain Consider knee sleeve/compression but reassuringly does not seem to have much swelling Discussed use of pillow under and between knees when sleeping

## 2024-01-02 NOTE — Patient Instructions (Signed)
  VISIT SUMMARY: Today, we discussed your chronic knee pain due to osteoarthritis and reviewed your current pain management strategies. We also addressed your general health maintenance needs, including vaccinations and a colonoscopy.  YOUR PLAN: KNEE OSTEOARTHRITIS: You have chronic knee pain due to osteoarthritis, which has worsened since your last knee injection wore off. -Start using Voltaren gel for pain management. -Continue using Aleve  for pain relief, and you can take a double dose for acute pain. -Consider using knee sleeves or compression if you experience swelling. -Continue elevating your knee and using a pillow between your knees while sleeping. -Schedule a follow-up appointment in one month for another knee injection.  GENERAL HEALTH MAINTENANCE: You are due for your flu and COVID vaccinations and a colonoscopy. -You received your flu shot today. -You received your COVID shot today. -Contact [facility] to schedule your colonoscopy.                      Contains text generated by Abridge.                                 Contains text generated by Abridge.

## 2024-01-02 NOTE — Progress Notes (Signed)
    SUBJECTIVE:   CHIEF COMPLAINT / HPI:   Discussed the use of AI scribe software for clinical note transcription with the patient, who gave verbal consent to proceed.  History of Present Illness Levi Blankenship is a 63 year old male with knee osteoarthritis who presents for knee pain management.  Knee pain - Chronic knee pain due to osteoarthritis - Increased pain since effects of last knee injection wore off sooner than expected - Last knee injection administered on November 05, 2023 - Pain is significant enough to require medication at night - Elevation of the knee with a pillow at night provides some relief  - Prefers to avoid oral medications  - Aleve  has provided longer-lasting relief compared to other medications - No use of topical treatments such as Voltaren gel  - Does not use knee sleeves or compression devices     PERTINENT  PMH / PSH: Left knee OA, COPD, asthma, diabetes  OBJECTIVE:   BP (!) 118/93   Pulse 97   Ht 5' 10 (1.778 m)   Wt (!) 324 lb (147 kg)   SpO2 99%   BMI 46.49 kg/m    Physical Exam General: NAD, pleasant, able to participate in exam Respiratory: No respiratory distress Skin: warm and dry, no rashes noted Psych: Normal affect and mood MSK: Left knee without significant gross deformity, ecchymosis or swelling.  Mildly tender to palpation along anterior joint line.  Otherwise nontender.  FROM without pain.  Gait unremarkable.    ASSESSMENT/PLAN:    Assessment & Plan Primary osteoarthritis of left knee Most recent knee injection less than 2 months ago so he is not due for repeat injection until November 20.  Discussed this with him.  He will schedule an appointment for this In the meantime recommend Voltaren gel and trial of Aleve  for acute pain Consider knee sleeve/compression but reassuringly does not seem to have much swelling Discussed use of pillow under and between knees when sleeping  General Health Maintenance Due for flu  and COVID vaccinations. Repeat colonoscopy seems to be due per most recent in 07/2022 - Administer flu shot. - Administer COVID shot. - Advise contacting [facility] to schedule colonoscopy.  Payton Coward, MD [facility]

## 2024-01-04 ENCOUNTER — Other Ambulatory Visit: Payer: Self-pay | Admitting: Family Medicine

## 2024-01-04 DIAGNOSIS — J4489 Other specified chronic obstructive pulmonary disease: Secondary | ICD-10-CM

## 2024-01-09 ENCOUNTER — Ambulatory Visit

## 2024-01-13 ENCOUNTER — Encounter (INDEPENDENT_AMBULATORY_CARE_PROVIDER_SITE_OTHER): Payer: Self-pay

## 2024-01-15 ENCOUNTER — Ambulatory Visit: Admitting: Student

## 2024-01-15 VITALS — BP 134/79 | HR 98

## 2024-01-15 DIAGNOSIS — M1712 Unilateral primary osteoarthritis, left knee: Secondary | ICD-10-CM

## 2024-01-15 DIAGNOSIS — M25552 Pain in left hip: Secondary | ICD-10-CM

## 2024-01-15 MED ORDER — METHYLPREDNISOLONE ACETATE 40 MG/ML IJ SUSP
40.0000 mg | Freq: Once | INTRAMUSCULAR | Status: AC
  Administered 2024-01-15: 40 mg via INTRAMUSCULAR

## 2024-01-15 NOTE — Progress Notes (Signed)
    SUBJECTIVE:   CHIEF COMPLAINT / HPI:   Yosiel L. Hamm is a 63 y.o. male presenting for left leg pain.   Discussed the use of AI scribe software for clinical note transcription with the patient, who gave verbal consent to proceed.   Knee pain - Knee pain present for nearly one year - Received steroid injections for knee pain; initial relief from injections, but duration of effect has shortened, now wearing off about a month earlier than expected - Next knee injection scheduled for November - Tylenol  1000 mg no longer provides relief  Hip pain - Pain localized to the lateral aspect of the hip - Pain does not radiate down the leg - Significant discomfort, especially at night, interfering with sleep - Unable to lie on the affected side due to pain - Sitting with legs bent alleviates pain - No grinding or popping sensations in the hip  PERTINENT  PMH / PSH: reviewed and updated.  OBJECTIVE:   BP 134/79   Pulse 98   SpO2 96%   Well-appearing, no acute distress Cardio: Regular rate, regular rhythm, no murmurs on exam. Pulm: Clear, no wheezing, no crackles. No increased work of breathing  MSK:  No obvious deformity of hip joint   Ambulates with limp 2/2 to pain  Pain appears to be on lateral aspect of hip near greater trochanter bursa    ASSESSMENT/PLAN:   Assessment & Plan Pain of left hip Most likely greater trochanter bursitis. Patient would like to proceed with steroid injection.   PROCEDURE: INJECTION: Patient was given informed consent, signed copy in the chart. Appropriate time out was taken. Area prepped and draped in usual sterile fashion. Ethyl chloride was  used for local anesthesia. A 22 gauge spinal needle was used.. 1 cc of methylprednisolone  40 mg/ml plus  4 cc of 1% lidocaine  without epinephrine  was injected into the left greater trochanter bursa using a lateral approach.   The patient tolerated the procedure well. There were no complications. Post  procedure instructions were given. Primary osteoarthritis of left knee Chronic osteoarthritis with reduced efficacy of steroid injections. Discussed gel shots as an alternative treatment option. - patient to schedule an appointment with sports medicine to discuss  - may be approaching referral for knee replacement      Damien Pinal, DO [facility]

## 2024-01-15 NOTE — Patient Instructions (Signed)
 Today you received an injection with corticosteroid. This injection is usually done in response to pain and inflammation. There is some "numbing" medicine also in the shot so the injected area may be numb and feel really good for the next couple of hours. The numbing medicine usually wears off in 2-3 hours though, and then your pain level will be right back where it was before the injection.   The actually benefit from the steroid injection is usually noticed in 2-7 days. You may actually experience a small (as in 10%) INCREASE in pain in the first 24 hours---that is common.   Things to watch out for that you should contact us  or a health care provider urgently would include: 1. Unusual (as in more than 10%) increase in pain 2. New fever > 101.5 3. New swelling or redness of the injected area.  4. Streaking of red lines around the area injected.

## 2024-01-15 NOTE — Assessment & Plan Note (Addendum)
 Chronic osteoarthritis with reduced efficacy of steroid injections. Discussed gel shots as an alternative treatment option. - patient to schedule an appointment with sports medicine to discuss  - may be approaching referral for knee replacement

## 2024-01-21 ENCOUNTER — Telehealth: Payer: Self-pay

## 2024-01-21 ENCOUNTER — Encounter: Payer: Self-pay | Admitting: Family Medicine

## 2024-01-21 NOTE — Telephone Encounter (Signed)
 Patients wife calls nurse line requesting an apt for steroid injection.   She reports significant pain in his left knee.   Last injection was 11/04/2023.   Advised next injection would be ~12/19.  She reports he would not be able to wait that long for next injection due to pain.   She asks if injection can be done sooner than December.   Will forward to PCP.

## 2024-01-22 ENCOUNTER — Ambulatory Visit
Admission: RE | Admit: 2024-01-22 | Discharge: 2024-01-22 | Disposition: A | Source: Ambulatory Visit | Attending: Family Medicine | Admitting: Family Medicine

## 2024-01-22 ENCOUNTER — Ambulatory Visit: Admitting: Family Medicine

## 2024-01-22 ENCOUNTER — Encounter: Payer: Self-pay | Admitting: Family Medicine

## 2024-01-22 VITALS — BP 159/108 | Ht 70.0 in | Wt 315.0 lb

## 2024-01-22 DIAGNOSIS — I10 Essential (primary) hypertension: Secondary | ICD-10-CM | POA: Diagnosis not present

## 2024-01-22 DIAGNOSIS — M25552 Pain in left hip: Secondary | ICD-10-CM | POA: Diagnosis not present

## 2024-01-22 DIAGNOSIS — M1712 Unilateral primary osteoarthritis, left knee: Secondary | ICD-10-CM

## 2024-01-22 DIAGNOSIS — M47816 Spondylosis without myelopathy or radiculopathy, lumbar region: Secondary | ICD-10-CM | POA: Diagnosis not present

## 2024-01-22 DIAGNOSIS — M1612 Unilateral primary osteoarthritis, left hip: Secondary | ICD-10-CM | POA: Diagnosis not present

## 2024-01-22 DIAGNOSIS — M5136 Other intervertebral disc degeneration, lumbar region with discogenic back pain only: Secondary | ICD-10-CM | POA: Diagnosis not present

## 2024-01-22 NOTE — Telephone Encounter (Signed)
Pt scheduled and informed. Ernestine Rohman, CMA  

## 2024-01-22 NOTE — Progress Notes (Signed)
 PCP: Theophilus Pagan, MD  Patient is a 63 y.o. male here for left knee pain, previously received steroid injections 5/23 and 11/04/23.  HPI Patient has osteoarthritis of the left knee joint.  He has received steroid injections before with good benefit, though he shares today that 2 weeks ago his knee suddenly began to hurt again.  At the same time his left hip became very painful.  No accidents, trauma, falls. He is so uncomfortable that his daily activities and gait have been affected. He has tried Tylenol  without much relief.  Past Medical History:  Diagnosis Date   Allergy     Claustrophobia 11/27/2020   COPD (chronic obstructive pulmonary disease) (HCC)    COVID    SPRING 2022 COUGH CHILLS SOB X 4 DAYS ALL SYMPTOMS REOLVED   GERD (gastroesophageal reflux disease)    Hemorrhoids    Hypertension    MILD ANXIETY 11/27/2020   NO MEDS TAKEN   MILD DEPRESSION 11/27/2020   NO MEDS TAKEN   Mild intermittent asthma without complication 02/17/2019   Prolapsed hemorrhoids 11/27/2020   Rotator cuff tear, left    DONE SEVERAL MNTHS AGO, PER PT WIFE ON 11-27-2020 HAS PAIN WITH CAN MOVE ARM OK   Sleep apnea    not currently using c-pap   Type 2 diabetes 12/01/2018   Urinary frequency 11/27/2020   Wears partial dentures 11/27/2020   UPPER AND LOWER    Current Outpatient Medications on File Prior to Visit  Medication Sig Dispense Refill   Accu-Chek Softclix Lancets lancets SMARTSIG:Topical (Patient not taking: Reported on 09/10/2022)     albuterol  (VENTOLIN  HFA) 108 (90 Base) MCG/ACT inhaler Inhale 2 puffs into the lungs every 6 (six) hours as needed for wheezing or shortness of breath. 18 g 11   amlodipine -olmesartan  (AZOR ) 10-20 MG tablet Take 1 tablet by mouth daily. 90 tablet 1   aspirin  EC 81 MG tablet Take 81 mg by mouth daily.     atorvastatin  (LIPITOR) 40 MG tablet Take 1 tablet (40 mg total) by mouth daily. 90 tablet 1   Blood Glucose Monitoring Suppl DEVI 1 each by Does not apply  route in the morning, at noon, and at bedtime. May substitute to any manufacturer covered by patient's insurance. 1 each 0   budesonide -formoterol  (SYMBICORT ) 80-4.5 MCG/ACT inhaler Inhale 2 puffs into the lungs 2 (two) times daily. 1 each 12   cetirizine  (ZYRTEC ) 10 MG tablet Take 10 mg by mouth as needed for allergies.     famotidine  (PEPCID ) 20 MG tablet Take 1 tablet by mouth once daily 90 tablet 0   fluticasone  (FLONASE ) 50 MCG/ACT nasal spray Place 2 sprays into both nostrils daily. 16 g 6   gabapentin  (NEURONTIN ) 300 MG capsule Take 1 capsule (300 mg total) by mouth daily. 90 capsule 1   glucose blood (ACCU-CHEK AVIVA PLUS) test strip Use to test blood sugar 3x per day. E11.9 100 each 12   ipratropium-albuterol  (DUONEB) 0.5-2.5 (3) MG/3ML SOLN Take 3 mLs by nebulization every 4 (four) hours as needed. 360 mL 1   levocetirizine (XYZAL ) 5 MG tablet TAKE 1 TABLET BY MOUTH ONCE DAILY IN THE EVENING. PT NEEDS OV FOR ANY ADDITIONAL REFILLS.     metFORMIN  (GLUCOPHAGE ) 500 MG tablet Take 2 tablets (1,000 mg total) by mouth daily with breakfast. 180 tablet 1   Semaglutide ,0.25 or 0.5MG /DOS, 2 MG/1.5ML SOPN Inject 0.25 mg into the skin once a week. 0.25 mg once weekly for 4 weeks then increase to 0.5 mg weekly  for at least 4 weeks,max 1 mg 3 mL 3   sildenafil  (VIAGRA ) 100 MG tablet Take 0.5-1 tablets (50-100 mg total) by mouth daily as needed for erectile dysfunction. 30 tablet 11   No current facility-administered medications on file prior to visit.    Past Surgical History:  Procedure Laterality Date   BOWEL RESECTION     About 5-6 years ago   COLONSCOPY  10/10/2020   [facility] WITH PRECANCEROUS AREAS REMOVED   EVALUATION UNDER ANESTHESIA WITH HEMORRHOIDECTOMY N/A 11/30/2020   Procedure: EXCISION OF PROLAPSING ANAL MASS, HEMORRHOIDECTOMY, HEMORRHOIDOPEXY, ANORECTAL EXAMINATION UNDER ANESTHESIA;  Surgeon: Sheldon Standing, MD;  Location: [facility];  Service: General;  Laterality:  N/A;  GEN & LOCAL   TONSILLECTOMY     MORE THAN 20 YRS AGO, ADDENOIDS REMOVED ALSO   tumor neck     benign and removed 15 YRS AGO    No Known Allergies  BP (!) 159/108   Ht 5' 10 (1.778 m)   Wt (!) 315 lb (142.9 kg)   BMI 45.20 kg/m       No data to display              No data to display              Objective:  Physical Exam: Gen: NAD, comfortable in exam room  Left Knee Exam Inspection: No effusion, gross deformity, ecchymoses. Palpation: No tenderness to palpation over the knee joint. ROM: Full range of motion with normal strength. Special Tests: Negative anterior/posterior drawers. Neurovascularly intact distally.  Hip Exam Inspection: No gross deformity bilaterally. Palpation: No tenderness to palpation. Special Tests: Positive straight leg raise test on the left.  Positive logroll on the left.  FABER/FADIR with pain.  Assessment and Plan:  Left knee and hip pain - Known mild OA of left knee, now not responding to steroid injections which were previously helping.  Now in light of recent onset of similar hip pain, suspect patient may have component of left hip OA as well, possibly contributing to left knee pain. - Lumbar spine, pelvis, left hip x-rays ordered - He may continue supportive care with Tylenol , Voltaren gel; he is already taking gabapentin  300 mg at bedtime. - Follow-up after imaging to discuss next steps, possibly to include HA injections or further imaging with MRI.

## 2024-01-27 ENCOUNTER — Other Ambulatory Visit: Payer: Self-pay

## 2024-01-27 ENCOUNTER — Ambulatory Visit (INDEPENDENT_AMBULATORY_CARE_PROVIDER_SITE_OTHER): Admitting: Family Medicine

## 2024-01-27 VITALS — BP 130/88 | Ht 70.0 in | Wt 310.0 lb

## 2024-01-27 DIAGNOSIS — M25552 Pain in left hip: Secondary | ICD-10-CM

## 2024-01-27 NOTE — Patient Instructions (Signed)
 [facility] will call you to set up the hip injection. If you don't hear from them by the end of the week please call them at [phone]

## 2024-01-28 NOTE — Progress Notes (Signed)
 A user error has taken place: encounter opened in error, closed for administrative reasons.  Attempted ultrasound guided injection of the left hip, but due to body habitus and elevated BMI, was unable to clearly visualize hip joint, therefor injection not completed.  Referred to IR for IR guided left hip cortisone injection.

## 2024-01-29 ENCOUNTER — Other Ambulatory Visit: Payer: Self-pay | Admitting: Family Medicine

## 2024-01-29 ENCOUNTER — Ambulatory Visit
Admission: RE | Admit: 2024-01-29 | Discharge: 2024-01-29 | Disposition: A | Discharge status: Home / Self Care | Source: Ambulatory Visit | Attending: Family Medicine | Admitting: Family Medicine

## 2024-01-29 ENCOUNTER — Other Ambulatory Visit: Payer: Self-pay | Admitting: *Deleted

## 2024-01-29 DIAGNOSIS — M25552 Pain in left hip: Secondary | ICD-10-CM

## 2024-01-29 MED ORDER — METHYLPREDNISOLONE ACETATE 40 MG/ML INJ SUSP (RADIOLOG
80.0000 mg | Freq: Once | INTRAMUSCULAR | Status: AC
  Administered 2024-01-29: 80 mg via INTRA_ARTICULAR

## 2024-01-29 MED ORDER — IOPAMIDOL (ISOVUE-M 200) INJECTION 41%
1.0000 mL | Freq: Once | INTRAMUSCULAR | Status: AC
  Administered 2024-01-29: 1 mL via INTRA_ARTICULAR

## 2024-01-30 ENCOUNTER — Telehealth: Payer: Self-pay | Admitting: Family Medicine

## 2024-01-30 DIAGNOSIS — M1612 Unilateral primary osteoarthritis, left hip: Secondary | ICD-10-CM

## 2024-01-30 DIAGNOSIS — M25552 Pain in left hip: Secondary | ICD-10-CM

## 2024-01-30 MED ORDER — TRAMADOL HCL 50 MG PO TABS: 50.0000 mg | ORAL_TABLET | Freq: Two times a day (BID) | ORAL | 0 refills | Status: AC | PRN

## 2024-01-30 NOTE — Telephone Encounter (Addendum)
 Rx for tramadol  sent to pharmacy for limited 5-day supply.    ----- Message from Duwaine Bolognese sent at 01/30/2024  8:56 AM EST ----- Regarding: FW: Pt's wife is on the phone again about pain Rx requested yesterday I'll call them. Could you send in some tramadol  please? And I'll let them know ----- Message ----- From: Kayla Alisa CROME Sent: 01/30/2024   8:47 AM EST To: Duwaine Bolognese, LAT Subject: Pt's wife is on the phone again about pain R#  Pt had Epidural yesterday but still no pain relief & they are requesting stronger pain Rx.. Can you talk with them

## 2024-02-02 ENCOUNTER — Other Ambulatory Visit (HOSPITAL_COMMUNITY): Payer: Self-pay

## 2024-02-04 ENCOUNTER — Encounter: Payer: Self-pay | Admitting: Family Medicine

## 2024-02-05 NOTE — Progress Notes (Signed)
    SUBJECTIVE:   CHIEF COMPLAINT / HPI:   Hypertension: - Medications: Amlodipine -Olmesartan  10-20mg  daily - Compliance: Yes, did not take today. - Checking BP at home: Rarely - Denies any SOB, CP, vision changes, LE edema, medication SEs, or symptoms of hypotension  Chronic left knee OA Last injection 11/04/2023 in left knee. Received left hip injection on 01/29/2024 via [facility].  Feels like his pain has improved but now his left knee is acting up again.  PERTINENT  PMH / PSH: T2DM, HTN, HLD, left knee OA, torn rotator cuff of right shoulder, COPD, asthma, GERD   OBJECTIVE:   BP (!) 148/86   Pulse (!) 59   Ht 5' 10 (1.778 m)   Wt (!) 325 lb 6.4 oz (147.6 kg)   SpO2 97%   BMI 46.69 kg/m    General: NAD, pleasant, able to participate in exam Cardiac: RRR, no murmurs. Respiratory: CTAB, normal effort, No wheezes, rales or rhonchi Abdomen: Bowel sounds present, nontender, nondistended Extremities: no edema or cyanosis. Skin: warm and dry, no rashes noted Neuro: alert, no obvious focal deficits Psych: Normal affect and mood  ASSESSMENT/PLAN:   Assessment & Plan Type 2 diabetes mellitus with diabetic neuropathy, without long-term current use of insulin  (HCC) A1c 7.8, up from 7.4 in 10/2023.  Doing well on Ozempic  0.5 mg weekly and metformin  without side effects.  Given A1c increased despite addition of Ozempic  at last visit, will increase further for better glycemic control. -Increase to Ozempic  1 mg weekly -Repeat A1c in 3 months Primary osteoarthritis of left knee Severe symptoms, eligible for steroid injection of left knee and performed as below.  Return in 3 months for repeat injection as needed.  Continue supportive pain management. Hypertension, benign 148/86 upon repeat, did not take his blood pressure medication today.  Has had some blood pressure fluctuation in recent visits, discussed increasing BP medication but patient would like to defer at this time and continue home  monitoring.  Advised to follow-up in 1 month if persistently above goal.    Left knee steroid injection After informed written consent timeout was performed, patient was seated on exam table. Left knee was prepped with alcohol swab and utilizing anteromedial approach, patient's left knee was injected intraarticularly with 3:1 lidocaine : depomedrol. Patient tolerated the procedure well without immediate complications.   Dr. Izetta Nap, DO [facility]

## 2024-02-06 ENCOUNTER — Ambulatory Visit (INDEPENDENT_AMBULATORY_CARE_PROVIDER_SITE_OTHER): Admitting: Family Medicine

## 2024-02-06 ENCOUNTER — Encounter: Payer: Self-pay | Admitting: Family Medicine

## 2024-02-06 VITALS — BP 148/86 | HR 59 | Ht 70.0 in | Wt 325.4 lb

## 2024-02-06 DIAGNOSIS — I10 Essential (primary) hypertension: Secondary | ICD-10-CM

## 2024-02-06 DIAGNOSIS — M1712 Unilateral primary osteoarthritis, left knee: Secondary | ICD-10-CM | POA: Diagnosis present

## 2024-02-06 DIAGNOSIS — E1140 Type 2 diabetes mellitus with diabetic neuropathy, unspecified: Secondary | ICD-10-CM | POA: Diagnosis not present

## 2024-02-06 LAB — POCT GLYCOSYLATED HEMOGLOBIN (HGB A1C): HbA1c, POC (controlled diabetic range): 7.8 % — AB (ref 0.0–7.0)

## 2024-02-06 MED ORDER — SEMAGLUTIDE (1 MG/DOSE) 4 MG/3ML ~~LOC~~ SOPN: 1.0000 mg | PEN_INJECTOR | SUBCUTANEOUS | 5 refills | Status: DC

## 2024-02-06 MED ORDER — METHYLPREDNISOLONE ACETATE 40 MG/ML IJ SUSP
40.0000 mg | Freq: Once | INTRAMUSCULAR | Status: AC
  Administered 2024-02-06: 40 mg via INTRAMUSCULAR

## 2024-02-06 NOTE — Addendum Note (Signed)
 Addended by: NORVILLE CASIMIR BROCKS on: 02/06/2024 04:46 PM   Modules accepted: Orders

## 2024-02-06 NOTE — Assessment & Plan Note (Signed)
 A1c 7.8, up from 7.4 in 10/2023.  Doing well on Ozempic  0.5 mg weekly and metformin  without side effects.  Given A1c increased despite addition of Ozempic  at last visit, will increase further for better glycemic control. -Increase to Ozempic  1 mg weekly -Repeat A1c in 3 months

## 2024-02-06 NOTE — Assessment & Plan Note (Signed)
 148/86 upon repeat, did not take his blood pressure medication today.  Has had some blood pressure fluctuation in recent visits, discussed increasing BP medication but patient would like to defer at this time and continue home monitoring.  Advised to follow-up in 1 month if persistently above goal.

## 2024-02-06 NOTE — Assessment & Plan Note (Signed)
 Severe symptoms, eligible for steroid injection of left knee and performed as below.  Return in 3 months for repeat injection as needed.  Continue supportive pain management.

## 2024-02-06 NOTE — Patient Instructions (Addendum)
 It was wonderful to see you today! Thank you for choosing [facility].   Please bring ALL of your medications with you to every visit.   Today we talked about:  We gave you an injection in your left knee, you can return in 3 months to have it done again.  Please continue to use Tylenol , Voltaren gel and continue your activities as tolerated for pain relief.  If it gets unbearable prior to that time please return to care as we can discuss other options. I am check your A1c today as well to make sure your diabetes is still doing good.  Please continue to take your medication as prescribed. Please continue take your blood pressure medication and check your blood pressure at home if possible.  If needed we can adjust your medication regimen if it is persistently high.  Please follow up in 3 months   If you haven't already, sign up for My Chart to have easy access to your labs results, and communication with your primary care physician.   We are checking some labs today. If they are abnormal, I will call you. If they are normal, I will send you a MyChart message (if it is active) or a letter in the mail. If you do not hear about your labs in the next 2 weeks, please call the office.  Call the clinic at [phone] if your symptoms worsen or you have any concerns.  Please be sure to schedule follow up at the front desk before you leave today.   Izetta Nap, DO Family Medicine   Joint Injection, Care After Refer to this sheet in the next few weeks. These instructions provide you with information about caring for yourself after your procedure. Your health care provider may also give you more specific instructions. Your treatment has been planned according to current medical practices, but problems sometimes occur. Call your health care provider if you have any problems or questions after your procedure. What can I expect after the procedure? After the procedure, it is common  to have: Soreness. Warmth. Swelling. You may have more pain, swelling, and warmth than you did before the injection. This reaction may last for about one day. Follow these instructions at home: Bathing If you were given a bandage (dressing), keep it dry until your health care provider says it can be removed. Ask your health care provider when you can start showering or taking a bath. Managing pain, stiffness, and swelling If directed, apply ice to the injection area: Put ice in a plastic bag. Place a towel between your skin and the bag. Leave the ice on for 20 minutes, 2-3 times per day. Do not apply heat to your knee. Raise the injection area above the level of your heart while you are sitting or lying down. Activity Avoid strenuous activities for as long as directed by your health care provider. Ask your health care provider when you can return to your normal activities. General instructions Take medicines only as directed by your health care provider. Do not take aspirin  or other over-the-counter medicines unless your health care provider says you can. Check your injection site every day for signs of infection. Watch for: Redness, swelling, or pain. Fluid, blood, or pus. Follow your health care provider's instructions about dressing changes and removal. Contact a health care provider if: You have symptoms at your injection site that last longer than two days after your procedure. You have redness, swelling, or pain in your  injection area. You have fluid, blood, or pus coming from your injection site. You have warmth in your injection area. You have a fever. Your pain is not controlled with medicine. Get help right away if:

## 2024-02-09 ENCOUNTER — Ambulatory Visit (INDEPENDENT_AMBULATORY_CARE_PROVIDER_SITE_OTHER): Admitting: Student

## 2024-02-09 VITALS — BP 139/88 | HR 94

## 2024-02-09 DIAGNOSIS — M1612 Unilateral primary osteoarthritis, left hip: Secondary | ICD-10-CM

## 2024-02-09 DIAGNOSIS — M1712 Unilateral primary osteoarthritis, left knee: Secondary | ICD-10-CM | POA: Diagnosis present

## 2024-02-09 MED ORDER — MELOXICAM 15 MG PO TABS
15.0000 mg | ORAL_TABLET | Freq: Every day | ORAL | 0 refills | Status: AC
Start: 2024-02-09 — End: ?

## 2024-02-09 NOTE — Progress Notes (Signed)
    SUBJECTIVE:   CHIEF COMPLAINT / HPI:   The patient presents with hip and knee arthritis pain.  He experiences significant pain in his left hip and knee, with the hip pain being particularly severe for the past two weeks. A cortisone injection in his knee provided initial relief, but the pain has returned. Tylenol , Advil, and tramadol  are no longer effective for pain management. He dislikes taking pills but recognizes the need for pain relief. He is under the care of a sports medicine team for his joint issues. Hasn't tried physical therapy.  There is no history of stomach ulcers or gastrointestinal bleeding.  Hip x-ray 11/11 showed degenerative changes in the left hip. X-ray of the spine showed spurring in multiple layers of the lumbar spine Received fluoroscopy guided left hip injection on 11/13.  PERTINENT  PMH / PSH: Reviewed   OBJECTIVE:   BP 139/88   Pulse 94   SpO2 100%    Physical Exam  General: Alert, well appearing, NAD  L Knee Exam No effusion.  No other gross deformity, ecchymoses. TTP lateral joint line. FROM with normal strength. Negative ant/post drawers. Negative valgus/varus testing. Negative lachman.   L Hip Exam No deformity. FROM with 5/5 strength except 4-/5 hip abduction with pain. Tenderness to palpation within gluteal musculature.  No trochanter, SI joint, piriformis tenderness. NVI distally. Positive logroll. Unable to hop on left leg due to pain    ASSESSMENT/PLAN:   Arthritis of left hip Chronic osteoarthritis in the left hip and knee with severe persistent pain in the left hip. Previous treatments ineffective. Recent knee injection provided temporary relief. - Prescribed meloxicam  for anti-inflammatory and pain relief. - Referred to physical therapy for joint management. - Advised weight loss to alleviate joint stress. - Instructed to avoid Aleve , Motrin, and ibuprofen while on meloxicam ; acetaminophen  every six hours is permissible. -  Recommended follow-up with sports medicine for further management.     Norleen April, MD [facility]

## 2024-02-09 NOTE — Patient Instructions (Signed)
 Pleasure to see you today.  I have placed referral to physical therapy.  Please look out for a call from them to schedule an appointment.  I have also put send in a prescription for meloxicam  which is an anti-inflammatory medication that should help with inflammation and pain.  In the meantime I recommend following up with sports medicine for further management of your left hip pain.

## 2024-02-09 NOTE — Assessment & Plan Note (Signed)
 Chronic osteoarthritis in the left hip and knee with severe persistent pain in the left hip. Previous treatments ineffective. Recent knee injection provided temporary relief. - Prescribed meloxicam  for anti-inflammatory and pain relief. - Referred to physical therapy for joint management. - Advised weight loss to alleviate joint stress. - Instructed to avoid Aleve , Motrin, and ibuprofen while on meloxicam ; acetaminophen  every six hours is permissible. - Recommended follow-up with sports medicine for further management.

## 2024-02-16 ENCOUNTER — Encounter: Payer: Self-pay | Admitting: Family Medicine

## 2024-02-17 ENCOUNTER — Ambulatory Visit: Admitting: Family Medicine

## 2024-02-17 ENCOUNTER — Encounter: Payer: Self-pay | Admitting: Family Medicine

## 2024-02-17 VITALS — BP 138/88 | Ht 70.0 in | Wt 325.0 lb

## 2024-02-17 DIAGNOSIS — M1612 Unilateral primary osteoarthritis, left hip: Secondary | ICD-10-CM

## 2024-02-17 DIAGNOSIS — M25552 Pain in left hip: Secondary | ICD-10-CM | POA: Diagnosis not present

## 2024-02-17 MED ORDER — DIAZEPAM 5 MG PO TABS: 5.0000 mg | ORAL_TABLET | Freq: Once | ORAL | 0 refills | Status: AC

## 2024-02-17 MED ORDER — OXYCODONE HCL 5 MG PO TABS: 5.0000 mg | ORAL_TABLET | Freq: Two times a day (BID) | ORAL | 0 refills | Status: AC | PRN

## 2024-02-17 NOTE — Progress Notes (Signed)
 DATE OF VISIT: 02/17/2024        Levi Blankenship DOB: 1960-08-02 MRN: [id number]  Discussed the use of AI scribe software for clinical note transcription with the patient, who gave verbal consent to proceed.  History of Present Illness Levi Blankenship is a 64 year old male with osteoarthritis who presents with worsening left hip pain. He was referred by his PCP for further evaluation of his left hip pain.  Left hip pain - Worsening pain since January 27, 2024, associated with osteoarthritis - Pain described as severe and 'paralyzing on this left side', sometimes rendering him unable to move his leg - Requires assistance from his wife for basic activities such as going to the bathroom - Pain primarily located in the left hip, not the knee - Significant difficulty with mobility, movement described as 'limping and hobbling around' - Poor sleep due to pain  Response to pain management - Cortisone injection under fluoroscopic guidance on January 29, 2024, initially increased pain for two days, then provided relief, but pain has since worsened again - Tramadol  has not been effective - Advil and Tylenol  provide some relief but are insufficient - Meloxicam  prescribed on February 09, 2024, has not been helpful - No prior use of stronger pain medications such as hydrocodone  or oxycodone   Left knee pain - Cortisone injection in the left knee on February 06, 2024 - Improvement in left knee pain over the last two weeks  Diabetes mellitus - History of diabetes    Medications:  Outpatient Encounter Medications as of 02/17/2024  Medication Sig   diazepam (VALIUM) 5 MG tablet Take 1 tablet (5 mg total) by mouth once for 1 dose. 1 tab PO 30-60 mins prior to MRI, can repeat dose x 1 at time of scan if needed   oxyCODONE  (ROXICODONE ) 5 MG immediate release tablet Take 1 tablet (5 mg total) by mouth 2 (two) times daily as needed for up to 5 days for severe pain (pain score 7-10).   Accu-Chek  Softclix Lancets lancets SMARTSIG:Topical (Patient not taking: Reported on 09/10/2022)   albuterol  (VENTOLIN  HFA) 108 (90 Base) MCG/ACT inhaler Inhale 2 puffs into the lungs every 6 (six) hours as needed for wheezing or shortness of breath.   amlodipine -olmesartan  (AZOR ) 10-20 MG tablet Take 1 tablet by mouth daily.   aspirin  EC 81 MG tablet Take 81 mg by mouth daily.   atorvastatin  (LIPITOR) 40 MG tablet Take 1 tablet (40 mg total) by mouth daily.   Blood Glucose Monitoring Suppl DEVI 1 each by Does not apply route in the morning, at noon, and at bedtime. May substitute to any manufacturer covered by patient's insurance.   budesonide -formoterol  (SYMBICORT ) 80-4.5 MCG/ACT inhaler Inhale 2 puffs into the lungs 2 (two) times daily.   cetirizine  (ZYRTEC ) 10 MG tablet Take 10 mg by mouth as needed for allergies.   famotidine  (PEPCID ) 20 MG tablet Take 1 tablet by mouth once daily   fluticasone  (FLONASE ) 50 MCG/ACT nasal spray Place 2 sprays into both nostrils daily.   gabapentin  (NEURONTIN ) 300 MG capsule Take 1 capsule (300 mg total) by mouth daily.   glucose blood (ACCU-CHEK AVIVA PLUS) test strip Use to test blood sugar 3x per day. E11.9   ipratropium-albuterol  (DUONEB) 0.5-2.5 (3) MG/3ML SOLN Take 3 mLs by nebulization every 4 (four) hours as needed.   levocetirizine (XYZAL ) 5 MG tablet TAKE 1 TABLET BY MOUTH ONCE DAILY IN THE EVENING. PT NEEDS OV FOR ANY ADDITIONAL REFILLS.   meloxicam  (MOBIC )  15 MG tablet Take 1 tablet (15 mg total) by mouth daily.   metFORMIN  (GLUCOPHAGE ) 500 MG tablet Take 2 tablets (1,000 mg total) by mouth daily with breakfast.   Semaglutide , 1 MG/DOSE, 4 MG/3ML SOPN Inject 1 mg into the skin once a week.   sildenafil  (VIAGRA ) 100 MG tablet Take 0.5-1 tablets (50-100 mg total) by mouth daily as needed for erectile dysfunction.   No facility-administered encounter medications on file as of 02/17/2024.    Allergies: has no known allergies.  Physical Examination: Vitals: BP  138/88   Ht 5' 10 (1.778 m)   Wt (!) 325 lb (147.4 kg)   BMI 46.63 kg/m  GENERAL:  Levi Blankenship is a 63 y.o. male appearing their stated age, alert and oriented x 3, in no apparent distress.  SKIN: no rashes or lesions, skin clean, dry, intact MSK: Left hip without any gross abnormality.  Decreased range of motion with associated pain with flexion, as well as internal and external rotation.  Positive logroll.  Positive FABER, positive FADIR.  Left hip strength 4 -/5 and limited by pain.  Right hip with full range of motion without pain or weakness Walking with antalgic gait Neurovascularly intact distally  Radiology: Left hip fluoroscopy guided cortisone injection 01/29/2024 at [facility] showing: IMPRESSION: Successful fluoroscopic guided therapeutic steroid injection of the left hip. Procedure performed by: Warren Dais, NP  Left hip and pelvis x-ray 01/22/2024 showing: IMPRESSION: 1. Degenerative changes in the left hip with joint space narrowing and spurring. 2. No acute findings.  L-spine x-ray 01/22/2024 showing: IMPRESSION: 1. No acute abnormality of the lumbar spine. Assessment & Plan Primary osteoarthritis of left hip with acute pain -pain has been acutely worsening despite fluoroscopic guided cortisone injection, NSAIDs, tramadol  Differential includes inadequate cortisone response, increased inflammation, worsening arthritis severity, or bone issues due to diabetes or other abnormality.  - MRI required for further evaluation. Ordered MRI of left hip with Valium for claustrophobia. - Prescribed oxycodone  5 mg up to twice daily as needed, dispense 10 tabs, no refills.  Advised should take sparingly.  Advised may cause drowsiness so should primarily take at bedtime - Advised discontinuation of tramadol . - Encouraged continuation of physical therapy next week. - Allowed Tylenol , ibuprofen, or meloxicam  with oxycodone . - Follow-up after MRI to review results and  determine next steps of treatment.  Encouraged to reach out sooner if needed  Patient expressed understanding & agreement with above.  Encounter Diagnoses  Name Primary?   Acute hip pain, left Yes   Primary osteoarthritis of left hip     Orders Placed This Encounter  Procedures   MR HIP LEFT WO CONTRAST     Contains text generated by [employer].

## 2024-02-20 ENCOUNTER — Ambulatory Visit
Admission: RE | Admit: 2024-02-20 | Discharge: 2024-02-20 | Disposition: A | Discharge status: Home / Self Care | Source: Ambulatory Visit | Attending: Family Medicine | Admitting: Family Medicine

## 2024-02-20 DIAGNOSIS — M25552 Pain in left hip: Secondary | ICD-10-CM

## 2024-02-20 DIAGNOSIS — M1612 Unilateral primary osteoarthritis, left hip: Secondary | ICD-10-CM

## 2024-02-23 ENCOUNTER — Ambulatory Visit: Admitting: Family Medicine

## 2024-02-23 ENCOUNTER — Other Ambulatory Visit: Payer: Self-pay

## 2024-02-23 DIAGNOSIS — M25552 Pain in left hip: Secondary | ICD-10-CM

## 2024-02-24 ENCOUNTER — Telehealth: Payer: Self-pay | Admitting: Physician Assistant

## 2024-02-24 ENCOUNTER — Ambulatory Visit: Admitting: Physician Assistant

## 2024-02-24 ENCOUNTER — Ambulatory Visit: Admitting: Orthopaedic Surgery

## 2024-02-24 VITALS — Ht 68.0 in | Wt 320.0 lb

## 2024-02-24 DIAGNOSIS — M1712 Unilateral primary osteoarthritis, left knee: Secondary | ICD-10-CM

## 2024-02-24 DIAGNOSIS — M1612 Unilateral primary osteoarthritis, left hip: Secondary | ICD-10-CM

## 2024-02-24 NOTE — Progress Notes (Signed)
 Office Visit Note   Patient: Levi Blankenship           Date of Birth: 06-02-1960           MRN: [id number] Visit Date: 02/24/2024              Requested by: Teressa Rainell BROCKS, DO [address] [place],  [place] [address] PCP: Theophilus Pagan, MD   Assessment & Plan: Visit Diagnoses:  1. Unilateral primary osteoarthritis, left knee   2. Unilateral primary osteoarthritis, left hip     Plan: Impression is left hip and left knee osteoarthritis.  At this point, patient has had cortisone injections to both the left hip and left knee joints with only temporary relief.  We have discussed eventual need for left total hip replacement but unfortunately he has a BMI of 48 and a hemoglobin A1c is 7.8.  We have discussed needing to get to a weight of 260 pounds in order to have a BMI of less than 40 to proceed with surgery.  He also understands he needs to have a hemoglobin A1c of 7.7 or less.  We have provided him with a total hip replacement handout today.  Regards to his left knee, we have discussed submitting approval to insurance for viscosupplementation injection.  Follow-up once approved.  Follow-Up Instructions: Return for once approved for visco injection.   Orders:  Orders Placed This Encounter  Procedures   Ambulatory request for injection medication   No orders of the defined types were placed in this encounter.     Procedures: No procedures performed   Clinical Data: No additional findings.   Subjective: Chief Complaint  Patient presents with   Left Hip - Pain    HPI patient is a pleasant 63 year old gentleman who comes in today with left hip and left knee pain.  His pain starts in the lateral groin radiates down the anterior thigh and into the knee.  This is been ongoing for about a month and has progressively worsened.  He does however note a remote injury to his left knee years ago while playing sports.  Pain is intermittent without any specific aggravators.  He  has been taking over-the-counter pain medication without relief.  Review of Systems as detailed in HPI.  All others reviewed and are negative.   Objective: Vital Signs: Ht 5' 8 (1.727 m)   Wt (!) 320 lb (145.2 kg)   BMI 48.66 kg/m   Physical Exam well-developed and well-nourished male in no acute distress.  Alert and oriented x 3.  Ortho Exam left hip exam: Pain with logroll and FADIR testing.  Negative straight leg raise.  Left knee exam: No effusion.  Range of motion 0 to 115 degrees.  Medial and lateral joint line tenderness.  Mild patellofemoral crepitus.  He is neurovascularly intact distally.  Specialty Comments:  No specialty comments available.  Imaging: No new imaging   PMFS History: Patient Active Problem List   Diagnosis Date Noted   Arthritis of left hip 02/09/2024   Acute nasopharyngitis (common cold) 08/08/2023   Tubular adenoma of colon 07/14/2023   Primary osteoarthritis of both knees 07/14/2023   Osteoarthritis of left knee 05/27/2023   Full thickness rotator cuff tear 04/22/2023   Chronic obstructive pulmonary disease with acute exacerbation (HCC) 05/18/2022   Tobacco use 05/18/2022   Multiple adenomatous polyps 11/17/2020   Family history of colon cancer 11/17/2020   Diastasis recti 11/13/2020   Hx of adenomatous colonic polyps 11/13/2020  Mass of anus 11/13/2020   Prolapsed internal hemorrhoids, grade 4 11/13/2020   Umbilical hernia without obstruction and without gangrene 11/13/2020   Mild intermittent asthma without complication 02/17/2019   Type 2 diabetes mellitus (HCC) 12/01/2018   Seasonal and perennial allergic rhinitis 07/08/2018   Laryngopharyngeal reflux (LPR) 01/13/2018   Allergy  01/13/2018   Morbid obesity with BMI of 45.0-49.9, adult (HCC) 04/13/2017   OSA (obstructive sleep apnea) 04/13/2017   Primary osteoarthritis involving multiple joints 07/20/2014   Healthcare maintenance 01/03/2012   Hypertension, benign 03/22/2003   Past  Medical History:  Diagnosis Date   Allergy     Claustrophobia 11/27/2020   COPD (chronic obstructive pulmonary disease) (HCC)    COVID    SPRING 2022 COUGH CHILLS SOB X 4 DAYS ALL SYMPTOMS REOLVED   GERD (gastroesophageal reflux disease)    Hemorrhoids    Hypertension    MILD ANXIETY 11/27/2020   NO MEDS TAKEN   MILD DEPRESSION 11/27/2020   NO MEDS TAKEN   Mild intermittent asthma without complication 02/17/2019   Prolapsed hemorrhoids 11/27/2020   Rotator cuff tear, left    DONE SEVERAL MNTHS AGO, PER PT WIFE ON 11-27-2020 HAS PAIN WITH CAN MOVE ARM OK   Sleep apnea    not currently using c-pap   Type 2 diabetes 12/01/2018   Urinary frequency 11/27/2020   Wears partial dentures 11/27/2020   UPPER AND LOWER    Family History  Problem Relation Age of Onset   Diabetes Father    Heart disease Brother    Colon cancer Maternal Grandmother        dx. 60s   Esophageal cancer Maternal Grandfather 86   Prostate cancer Maternal Grandfather 86   Cancer Paternal Grandmother        Knot on leg   Stomach cancer Neg Hx    Colon polyps Neg Hx    Rectal cancer Neg Hx     Past Surgical History:  Procedure Laterality Date   BOWEL RESECTION     About 5-6 years ago   COLONSCOPY  10/10/2020   LABAUER GI WITH PRECANCEROUS AREAS REMOVED   EVALUATION UNDER ANESTHESIA WITH HEMORRHOIDECTOMY N/A 11/30/2020   Procedure: EXCISION OF PROLAPSING ANAL MASS, HEMORRHOIDECTOMY, HEMORRHOIDOPEXY, ANORECTAL EXAMINATION UNDER ANESTHESIA;  Surgeon: Sheldon Standing, MD;  Location: [facility];  Service: General;  Laterality: N/A;  GEN & LOCAL   TONSILLECTOMY     MORE THAN 20 YRS AGO, ADDENOIDS REMOVED ALSO   tumor neck     benign and removed 15 YRS AGO   Social History   Occupational History   Not on file  Tobacco Use   Smoking status: Some Days    Current packs/day: 0.00    Types: Cigarettes    Start date: 05/1992    Last attempt to quit: 05/2022    Years since quitting: 1.7    Smokeless tobacco: Never   Tobacco comments:    SMOKES SOME DAYS SMALL FEW CIGARETTES PER PT  Vaping Use   Vaping status: Never Used  Substance and Sexual Activity   Alcohol use: Yes    Alcohol/week: 1.0 standard drink of alcohol    Types: 1 Standard drinks or equivalent per week    Comment: 1/5 liquor not even once per week   Drug use: Yes    Types: Marijuana    Comment: MARIJUANA, couple times per week   Sexual activity: Yes

## 2024-02-24 NOTE — Telephone Encounter (Signed)
 Pt called and said he forgot to tell her he starts PT on Thursday and wants to know is that ok. RA#[phone]

## 2024-02-25 NOTE — Therapy (Incomplete)
 OUTPATIENT PHYSICAL THERAPY LOWER EXTREMITY EVALUATION   Patient Name: Levi Blankenship. Pellman MRN: [id number] DOB:Aug 10, 1960, 63 y.o., male Today's Date: 02/25/2024  END OF SESSION:   Past Medical History:  Diagnosis Date   Allergy     Claustrophobia 11/27/2020   COPD (chronic obstructive pulmonary disease) (HCC)    COVID    SPRING 2022 COUGH CHILLS SOB X 4 DAYS ALL SYMPTOMS REOLVED   GERD (gastroesophageal reflux disease)    Hemorrhoids    Hypertension    MILD ANXIETY 11/27/2020   NO MEDS TAKEN   MILD DEPRESSION 11/27/2020   NO MEDS TAKEN   Mild intermittent asthma without complication 02/17/2019   Prolapsed hemorrhoids 11/27/2020   Rotator cuff tear, left    DONE SEVERAL MNTHS AGO, PER PT WIFE ON 11-27-2020 HAS PAIN WITH CAN MOVE ARM OK   Sleep apnea    not currently using c-pap   Type 2 diabetes 12/01/2018   Urinary frequency 11/27/2020   Wears partial dentures 11/27/2020   UPPER AND LOWER   Past Surgical History:  Procedure Laterality Date   BOWEL RESECTION     About 5-6 years ago   COLONSCOPY  10/10/2020   LABAUER GI WITH PRECANCEROUS AREAS REMOVED   EVALUATION UNDER ANESTHESIA WITH HEMORRHOIDECTOMY N/A 11/30/2020   Procedure: EXCISION OF PROLAPSING ANAL MASS, HEMORRHOIDECTOMY, HEMORRHOIDOPEXY, ANORECTAL EXAMINATION UNDER ANESTHESIA;  Surgeon: Sheldon Standing, MD;  Location: [facility];  Service: General;  Laterality: N/A;  GEN & LOCAL   TONSILLECTOMY     MORE THAN 20 YRS AGO, ADDENOIDS REMOVED ALSO   tumor neck     benign and removed 15 YRS AGO   Patient Active Problem List   Diagnosis Date Noted   Arthritis of left hip 02/09/2024   Acute nasopharyngitis (common cold) 08/08/2023   Tubular adenoma of colon 07/14/2023   Primary osteoarthritis of both knees 07/14/2023   Osteoarthritis of left knee 05/27/2023   Full thickness rotator cuff tear 04/22/2023   Chronic obstructive pulmonary disease with acute exacerbation (HCC) 05/18/2022   Tobacco use  05/18/2022   Multiple adenomatous polyps 11/17/2020   Family history of colon cancer 11/17/2020   Diastasis recti 11/13/2020   Hx of adenomatous colonic polyps 11/13/2020   Mass of anus 11/13/2020   Prolapsed internal hemorrhoids, grade 4 11/13/2020   Umbilical hernia without obstruction and without gangrene 11/13/2020   Mild intermittent asthma without complication 02/17/2019   Type 2 diabetes mellitus (HCC) 12/01/2018   Seasonal and perennial allergic rhinitis 07/08/2018   Laryngopharyngeal reflux (LPR) 01/13/2018   Allergy  01/13/2018   Morbid obesity with BMI of 45.0-49.9, adult (HCC) 04/13/2017   OSA (obstructive sleep apnea) 04/13/2017   Primary osteoarthritis involving multiple joints 07/20/2014   Healthcare maintenance 01/03/2012   Hypertension, benign 03/22/2003    PCP:    Theophilus Pagan, MD    REFERRING PROVIDER: Rosalynn Camie CROME, MD   REFERRING DIAG: [phone] (ICD-10-CM) - Arthritis of left hip   THERAPY DIAG:  No diagnosis found.  Rationale for Evaluation and Treatment: Rehabilitation  ONSET DATE: ***  SUBJECTIVE:   SUBJECTIVE STATEMENT: ***  PERTINENT HISTORY: ***  PAIN:  Are you having pain? Yes: NPRS scale: *** Pain location: *** Pain description: *** Aggravating factors: *** Relieving factors: ***  PRECAUTIONS: {Therapy precautions:24002}  RED FLAGS: {PT Red Flags:29287}   WEIGHT BEARING RESTRICTIONS: {Yes ***/No:24003}  FALLS:  Has patient fallen in last 6 months? {fallsyesno:27318}  LIVING ENVIRONMENT: Lives with: {[facility] lives with:25569::lives with their family} Lives in: {Lives in:25570}  Stairs: {opstairs:27293} Has following equipment at home: {Assistive devices:23999}  OCCUPATION: ***  PLOF: {PLOF:24004}  PATIENT GOALS: ***  NEXT MD VISIT: ***  OBJECTIVE:  Note: Objective measures were completed at Evaluation unless otherwise noted.  DIAGNOSTIC FINDINGS:  01/22/24 DG L hip IMPRESSION: 1. Degenerative changes in the  left hip with joint space narrowing and spurring. 2. No acute findings.  01/22/24 DG Lumbar spine IMPRESSION: 1. No acute abnormality of the lumbar spine.  PATIENT SURVEYS:  {rehab surveys:24030}  COGNITION: Overall cognitive status: {cognition:24006}     SENSATION: {sensation:27233}  EDEMA:  {edema:24020}  MUSCLE LENGTH: Hamstrings: Right *** deg; Left *** deg Debby test: Right *** deg; Left *** deg  POSTURE: {posture:25561}  PALPATION: ***  LOWER EXTREMITY ROM:  {AROM/PROM:27142} ROM Right eval Left eval  Hip flexion    Hip extension    Hip abduction    Hip adduction    Hip internal rotation    Hip external rotation    Knee flexion    Knee extension    Ankle dorsiflexion    Ankle plantarflexion    Ankle inversion    Ankle eversion     (Blank rows = not tested)  LOWER EXTREMITY MMT:  MMT Right eval Left eval  Hip flexion    Hip extension    Hip abduction    Hip adduction    Hip internal rotation    Hip external rotation    Knee flexion    Knee extension    Ankle dorsiflexion    Ankle plantarflexion    Ankle inversion    Ankle eversion     (Blank rows = not tested)  LOWER EXTREMITY SPECIAL TESTS:  {LEspecialtests:26242}  FUNCTIONAL TESTS:  {Functional tests:24029}  GAIT: Distance walked: *** Assistive device utilized: {Assistive devices:23999} Level of assistance: {Levels of assistance:24026} Comments: ***                                                                                                                                TREATMENT DATE:  [facility] Adult PT Treatment:                                                DATE: 02/26/24 Therapeutic Exercise: *** Manual Therapy: *** Neuromuscular re-ed: *** Therapeutic Activity: *** Modalities: *** Self Care: ***     PATIENT EDUCATION:  Education details: Eval findings, POC, HEP, self care  Person educated: {Person educated:25204} Education method: {Education  Method:25205} Education comprehension: {Education Comprehension:25206}  HOME EXERCISE PROGRAM: ***  ASSESSMENT:  CLINICAL IMPRESSION: Patient is a 63 y.o. male who was seen today for physical therapy evaluation and treatment for  M16.12 (ICD-10-CM) - Arthritis of left hip  .   OBJECTIVE IMPAIRMENTS: {opptimpairments:25111}.   ACTIVITY LIMITATIONS: {activitylimitations:27494}  PARTICIPATION LIMITATIONS: {participationrestrictions:25113}  PERSONAL FACTORS: {Personal factors:25162} are  also affecting patient's functional outcome.   REHAB POTENTIAL: {rehabpotential:25112}  CLINICAL DECISION MAKING: {clinical decision making:25114}  EVALUATION COMPLEXITY: {Evaluation complexity:25115}   GOALS: Goals reviewed with patient? {yes/no:20286}  SHORT TERM GOALS: Target date: *** Pt will be Ind in an initial HEP  Baseline: Goal status: INITIAL  2.  *** Baseline:  Goal status: INITIAL  3.  *** Baseline:  Goal status: INITIAL  4.  *** Baseline:  Goal status: INITIAL  5.  *** Baseline:  Goal status: INITIAL  6.  *** Baseline:  Goal status: INITIAL  LONG TERM GOALS: Target date:   Pt will be Ind in a final HEP to maintain achieved LOF  Baseline: started Goal status: INITIAL  2.  *** Baseline:  Goal status: INITIAL  3.  *** Baseline:  Goal status: INITIAL  4.  *** Baseline:  Goal status: INITIAL  5.  *** Baseline:  Goal status: INITIAL  6.  *** Baseline:  Goal status: INITIAL   PLAN:  PT FREQUENCY: {rehab frequency:25116}  PT DURATION: {rehab duration:25117}  PLANNED INTERVENTIONS: {rehab planned interventions:25118::97110-Therapeutic exercises,97530- Therapeutic [phone]- Neuromuscular re-education,97535- Self Rjmz,02859- Manual therapy,Patient/Family education}  PLAN FOR NEXT SESSION: PIERRETTE Dasie Daft, PT 02/25/2024, 4:51 PM

## 2024-02-25 NOTE — Telephone Encounter (Signed)
 yes

## 2024-02-25 NOTE — Telephone Encounter (Signed)
 Patient aware

## 2024-02-26 ENCOUNTER — Ambulatory Visit

## 2024-03-03 NOTE — Progress Notes (Signed)
" ° ° °  SUBJECTIVE:   CHIEF COMPLAINT / HPI:   Left leg pain History of ongoing left knee OA and left hip OA, pain now become debilitating over the past month.Taking Tylenol , Excerdrin, Ibuprofen. Took Meloxicam , not working. Had Oxycodone , not sure if it helped. Tried Tramadol  but did not help.  Seen by sports medicine for similar concern, ordered MRI of left hip but patient unable to tolerate lying flat secondary to pain.  Reports the pain is so severe he is not ambulating with a cane, unable to sleep at night and his wife is having to help him in the bathroom.  Reports his father has a history of rheumatoid arthritis.  Denies fevers and N/V.  Denies pain in small joints such as hands and feet.  Additionally having some bilateral shoulder pain which is chronic. Denies inciting trauma that worsened pain.   Seen by orthopedics, recommended to lose weight prior to consideration of left hip replacement.  Patient desires surgery when possible.  PERTINENT  PMH / PSH: T2DM, HTN, HLD, left knee OA, torn rotator cuff of right shoulder, COPD, asthma, GERD   OBJECTIVE:   BP (!) 132/99   Pulse (!) 108   Ht 5' 8 (1.727 m)   Wt (!) 328 lb 6.4 oz (149 kg)   SpO2 97%   BMI 49.93 kg/m    General: NAD, pleasant, able to participate in exam Cardiac: RRR, no murmurs. Respiratory: CTAB, normal effort, No wheezes, rales or rhonchi MSK: - No erythema, deformity or effusion noted over left knee - Nontender to palpation over joint line - Significant pain in left knee extension and inability to raise from seated position without assistance with him cane - 5-/5 left knee extension, 5/5 right knee extension - Sensation intact distally, 2+ dorsalis pedis pulses bilaterally - No erythema, edema or deformity of PIP/DIP joints bilaterally Extremities: no edema or cyanosis. Skin: warm and dry, no rashes noted Neuro: alert, no obvious focal deficits Psych: Normal affect and mood  ASSESSMENT/PLAN:   Assessment &  Plan Type 2 diabetes mellitus with diabetic neuropathy, without long-term current use of insulin  (HCC) A1c mostly controlled, will optimize GLP-1 therapy to assist in weight loss with goal of left hip arthroplasty via orthopedics when possible. -Increase to Ozempic  2 mg weekly Polyarthralgia Known left hip and knee OA, now with debilitating pain over the past month without inciting trigger.  Unclear etiology, consideration of progression of underlying OA vs autoimmune process such as polymyalgia rheumatica and RA given family history although no small joint involvement.  Will obtain blood work for further evaluation and optimize pain regimen as possible given significant impact on ADLs. -ANA, anti-CCP, rheumatoid factor, ESR, CRP, CK -Increase gabapentin  600 mg nightly -Trial methocarbamol  500 mg twice daily as needed -Given possible autoimmune etiology, prednisone  50 mg daily x 5-day course   Dr. Izetta Nap, DO [facility]     "

## 2024-03-08 ENCOUNTER — Encounter: Payer: Self-pay | Admitting: Family Medicine

## 2024-03-08 ENCOUNTER — Ambulatory Visit (INDEPENDENT_AMBULATORY_CARE_PROVIDER_SITE_OTHER): Payer: Self-pay | Admitting: Family Medicine

## 2024-03-08 VITALS — BP 132/99 | HR 108 | Ht 68.0 in | Wt 328.4 lb

## 2024-03-08 DIAGNOSIS — M2550 Pain in unspecified joint: Secondary | ICD-10-CM | POA: Diagnosis not present

## 2024-03-08 DIAGNOSIS — E1140 Type 2 diabetes mellitus with diabetic neuropathy, unspecified: Secondary | ICD-10-CM

## 2024-03-08 MED ORDER — GABAPENTIN 300 MG PO CAPS: 600.0000 mg | ORAL_CAPSULE | Freq: Every day | ORAL | 1 refills | Status: AC

## 2024-03-08 MED ORDER — METHOCARBAMOL 500 MG PO TABS: 500.0000 mg | ORAL_TABLET | Freq: Three times a day (TID) | ORAL | 1 refills | Status: AC | PRN

## 2024-03-08 MED ORDER — PREDNISONE 50 MG PO TABS: 50.0000 mg | ORAL_TABLET | Freq: Every day | ORAL | 0 refills | Status: DC

## 2024-03-08 MED ORDER — OZEMPIC (2 MG/DOSE) 8 MG/3ML ~~LOC~~ SOPN: 2.0000 mg | PEN_INJECTOR | SUBCUTANEOUS | 5 refills | Status: AC

## 2024-03-08 NOTE — Patient Instructions (Addendum)
 It was wonderful to see you today! Thank you for choosing [facility].   Please bring ALL of your medications with you to every visit.   Today we talked about:  For your severe pain we are going to get some blood work today and see if there could be an autoimmune cause for your symptoms.  It could be a couple of different things such as rheumatoid arthritis or polymyalgia rheumatica that we will evaluate for further. I we will try to optimize your pain as best I can but we may need to do some trial and error.  I am and increase your gabapentin  to 600 mg at night and I like you to take the methocarbamol  at night in particular to to help with your sleep.  He can also take the methocarbamol  during the day if it helps with your symptoms.  Please continue to take the Tylenol  and meloxicam  for pain management.  We also can try a short course of steroids to see if this helps given there could be an autoimmune component, please take them daily for the next 5 days. Ultimately had like to get you to be able to do the MRI but to do that first we need to better control your pain.  You can consider following up with [facility] as they do injections in the hip that you could also consider for pain control. I am increasing your Ozempic  to 2 mg weekly so we can help with the weight loss since this is what is limiting your left hip replacement.  Please try to continue to reduce the amount of soda and sweet tea intake in addition to other packaged and processed foods.  Please follow up in 2-3 weeks   We are checking some labs today. If they are abnormal, I will call you. If they are normal, I will send you a MyChart message (if it is active) or a letter in the mail. If you do not hear about your labs in the next 2 weeks, please call the office.  Call the clinic at [phone] if your symptoms worsen or you have any concerns.  Please be sure to schedule follow up at the front desk before you  leave today.   Izetta Nap, DO Family Medicine

## 2024-03-09 LAB — CK: Total CK: 68 U/L (ref 41–331)

## 2024-03-09 LAB — C-REACTIVE PROTEIN: CRP: 69 mg/L — ABNORMAL HIGH (ref 0–10)

## 2024-03-09 LAB — ANA: Anti Nuclear Antibody (ANA): NEGATIVE

## 2024-03-09 LAB — SEDIMENTATION RATE: Sed Rate: 54 mm/h — ABNORMAL HIGH (ref 0–30)

## 2024-03-09 LAB — RHEUMATOID FACTOR: Rheumatoid fact SerPl-aCnc: 12.5 [IU]/mL

## 2024-03-09 NOTE — Assessment & Plan Note (Signed)
 A1c mostly controlled, will optimize GLP-1 therapy to assist in weight loss with goal of left hip arthroplasty via orthopedics when possible. -Increase to Ozempic  2 mg weekly

## 2024-03-12 ENCOUNTER — Encounter: Payer: Self-pay | Admitting: Family Medicine

## 2024-03-15 ENCOUNTER — Encounter: Payer: Self-pay | Admitting: Family Medicine

## 2024-03-15 ENCOUNTER — Ambulatory Visit: Payer: Self-pay | Admitting: Family Medicine

## 2024-03-15 DIAGNOSIS — M353 Polymyalgia rheumatica: Secondary | ICD-10-CM

## 2024-03-15 MED ORDER — PREDNISONE 5 MG PO TABS: 10.0000 mg | ORAL_TABLET | Freq: Every day | ORAL | 0 refills | Status: DC

## 2024-03-15 NOTE — Telephone Encounter (Signed)
 Spoke with patient and his wife Rock regarding lab results which are suggestive of polymyalgia rheumatica.  Testing for RA otherwise negative.  They report significant improvement with prednisone  50 mg daily but pain has now recurred since stopping the medication.  Discussed will trial prednisone  10 mg daily x 4 weeks and provide 5 mg tablets to increase dosing if needed.  Will need to follow-up after 4 weeks to assess for improvements and if doing well will taper steroids at that time.  Advised of potential secondary giant cell arteritis and red flag concerns that warrant ED evaluation.  Patient and wife in agreement of plan.  Izetta Nap, DO

## 2024-03-17 LAB — ANTI-CCP AB, IGG + IGA (RDL): Anti-CCP Ab, IgG + IgA (RDL): <20 U

## 2024-03-25 ENCOUNTER — Encounter: Payer: Self-pay | Admitting: Family Medicine

## 2024-04-06 ENCOUNTER — Encounter: Payer: Self-pay | Admitting: Family Medicine

## 2024-04-08 NOTE — Progress Notes (Cosign Needed Addendum)
" ° ° °  SUBJECTIVE:   CHIEF COMPLAINT / HPI:   Discussed the use of AI scribe software for clinical note transcription with the patient, who gave verbal consent to proceed.  He is accompanied by his wife.  Polymyalgia rheumatica - Persistent pain and limited mobility despite prednisone  15 mg daily, with some improvement - Significant pain in shoulders and right leg - Pain disrupts sleep and limits daily activities, including difficulty putting on a coat - Pain wakes him at night - Able to manage ADLs - Uses Tylenol , Excedrin, ibuprofen, and Aleve  for pain control - No fevers - Appetite unchanged  Diabetes Current Regimen: Ozempic  2mg  weekly, CBGs: Not checking Last A1c:  Lab Results  Component Value Date   HGBA1C 7.8 (A) 02/06/2024  -Reports he does have a glucometer at home, will begin checking fasting CBGs.  PERTINENT  PMH / PSH: T2DM, HTN, HLD, left knee OA, torn rotator cuff of right shoulder, COPD, asthma, GERD   OBJECTIVE:   BP (!) 159/100   Pulse (!) 51   Ht 5' 8 (1.727 m)   Wt (!) 310 lb (140.6 kg)   SpO2 98%   BMI 47.14 kg/m    General: NAD, pleasant, able to participate in exam Cardiac: RRR, no murmurs. Respiratory: CTAB, normal effort, No wheezes, rales or rhonchi MSK: Bilateral shoulders: -Unable to lift arm in flexion/abduction above shoulder height bilaterally -Tender to palpation over bilateral upper arms -Neurovascularly intact distally, normal sensation in bilateral hands -5/5 grip strength bilaterally Bilateral legs: -Able to stand unassisted from seated position -Tender to palpation over right thigh -Neurovascularly intact distally, normal sensation bilateral ankles Extremities: no edema or cyanosis. Skin: warm and dry, no rashes noted Neuro: alert, no obvious focal deficits Psych: Normal affect and mood  ASSESSMENT/PLAN:   Assessment & Plan Polymyalgia rheumatica Mild improvement on prednisone  15 mg daily but having persistent symptoms.   Will repeat inflammatory markers, if continued to trend up with persistent symptoms would consider referral for additional management. - Increased prednisone  to 20 mg daily. If symptoms persist after one week, increase to 25 mg daily. - ESR, CRP - Follow-up in 4 weeks to reassess symptoms, repeat inflammatory markers and consider steroid taper if appropriate Type 2 diabetes mellitus with diabetic neuropathy, without long-term current use of insulin  (HCC) Given prolonged steroids as above, recommend daily fasting CBG's may need adjustment to diabetes regimen. Hypertension, benign 159/100 despite taking home medication.  Possibly in the setting of steroid use/pain as above, recommended daily home monitoring for the next week and if persistently above 140/90 will increase combo hypertensive.   Dr. Izetta Nap, DO [facility]     "

## 2024-04-09 ENCOUNTER — Encounter: Payer: Self-pay | Admitting: Family Medicine

## 2024-04-09 ENCOUNTER — Ambulatory Visit: Payer: Self-pay | Admitting: Family Medicine

## 2024-04-09 VITALS — BP 159/100 | HR 51 | Ht 68.0 in | Wt 310.0 lb

## 2024-04-09 DIAGNOSIS — E1140 Type 2 diabetes mellitus with diabetic neuropathy, unspecified: Secondary | ICD-10-CM

## 2024-04-09 DIAGNOSIS — M353 Polymyalgia rheumatica: Secondary | ICD-10-CM

## 2024-04-09 DIAGNOSIS — I10 Essential (primary) hypertension: Secondary | ICD-10-CM

## 2024-04-09 LAB — POCT SEDIMENTATION RATE: POCT SED RATE: 16 mm/h (ref 0–22)

## 2024-04-09 MED ORDER — PREDNISONE 20 MG PO TABS: 20.0000 mg | ORAL_TABLET | Freq: Every day | ORAL | 0 refills | Status: AC

## 2024-04-09 MED ORDER — PREDNISONE 5 MG PO TABS: 5.0000 mg | ORAL_TABLET | Freq: Every day | ORAL | 0 refills | Status: AC

## 2024-04-09 NOTE — Assessment & Plan Note (Signed)
 Given prolonged steroids as above, recommend daily fasting CBG's may need adjustment to diabetes regimen.

## 2024-04-09 NOTE — Patient Instructions (Signed)
 It was wonderful to see you today! Thank you for choosing [facility].   Please bring ALL of your medications with you to every visit.   Today we talked about:  Given you are still having pain symptoms were increasing you to prednisone  20 mg daily which I would like you to trial for 1 week.  If you are still having breakthrough pain please increase to the 25 mg daily which would mean taking a 20 mg tablet and a 5 mg tablet.  I would like you to try this increase for 4 weeks we will see you back in the office to reassess your pain and recheck your inflammatory markers. I am repeating your inflammatory markers today, if they are improving that is reassuring if they are increasing we may have to discuss if alternative management is needed.  For your breakthrough pain please default to taking the Tylenol  as a preferred option up to 1000 mg 4 times per day.  Please try to focus on titrating the steroids over using the NSAID anti-inflammatories.  If you are going to use them please do it sparingly as it can impact your kidneys and heart long-term.  Please follow up in 4 weeks   We are checking some labs today. If they are abnormal, I will call you. If they are normal, I will send you a MyChart message (if it is active) or a letter in the mail. If you do not hear about your labs in the next 2 weeks, please call the office.  Call the clinic at [phone] if your symptoms worsen or you have any concerns.  Please be sure to schedule follow up at the front desk before you leave today.   Izetta Nap, DO Family Medicine

## 2024-04-09 NOTE — Assessment & Plan Note (Signed)
 159/100 despite taking home medication.  Possibly in the setting of steroid use/pain as above, recommended daily home monitoring for the next week and if persistently above 140/90 will increase combo hypertensive.

## 2024-04-10 LAB — BASIC METABOLIC PANEL WITH GFR
BUN/Creatinine Ratio: 18 (ref 10–24)
BUN: 18 mg/dL (ref 8–27)
CO2: 23 mmol/L (ref 20–29)
Calcium: 10 mg/dL (ref 8.6–10.2)
Chloride: 100 mmol/L (ref 96–106)
Creatinine, Ser: 1.01 mg/dL (ref 0.76–1.27)
Glucose: 64 mg/dL — ABNORMAL LOW (ref 70–99)
Potassium: 5 mmol/L (ref 3.5–5.2)
Sodium: 140 mmol/L (ref 134–144)
eGFR: 83 mL/min/{1.73_m2}

## 2024-04-10 LAB — C-REACTIVE PROTEIN: CRP: 24 mg/L — ABNORMAL HIGH (ref 0–10)

## 2024-04-12 ENCOUNTER — Ambulatory Visit: Payer: Self-pay | Admitting: Family Medicine

## 2024-04-15 ENCOUNTER — Telehealth: Payer: Self-pay

## 2024-04-15 NOTE — Progress Notes (Signed)
 Complex Care Management Note Care Guide Note  04/15/2024 Name: Levi Blankenship. [place] MRN: [id number] DOB: 07/07/1960   Complex Care Management Outreach Attempts: An unsuccessful telephone outreach was attempted today to offer the patient information about available complex care management services.  Follow Up Plan:  Additional outreach attempts will be made to offer the patient complex care management information and services.   Encounter Outcome:  No Answer    Jon Colt [facility]  [facility] Guide, Phone: [phone] Fax: [phone] Website: [email or web address]

## 2024-04-19 ENCOUNTER — Telehealth: Payer: Self-pay

## 2024-04-19 DIAGNOSIS — I10 Essential (primary) hypertension: Secondary | ICD-10-CM

## 2024-05-14 ENCOUNTER — Telehealth: Admitting: *Deleted
